# Patient Record
Sex: Female | Born: 1965 | Race: White | Hispanic: No | Smoking: Current every day smoker
Health system: Southern US, Community
[De-identification: ages and names within clinical notes are randomized; demographics above are authoritative.]

## PROBLEM LIST (undated history)

## (undated) DIAGNOSIS — M1990 Unspecified osteoarthritis, unspecified site: Secondary | ICD-10-CM

## (undated) DIAGNOSIS — C801 Malignant (primary) neoplasm, unspecified: Secondary | ICD-10-CM

## (undated) DIAGNOSIS — J45909 Unspecified asthma, uncomplicated: Secondary | ICD-10-CM

## (undated) DIAGNOSIS — I639 Cerebral infarction, unspecified: Secondary | ICD-10-CM

## (undated) DIAGNOSIS — K760 Fatty (change of) liver, not elsewhere classified: Secondary | ICD-10-CM

## (undated) DIAGNOSIS — E0500 Thyrotoxicosis with diffuse goiter without thyrotoxic crisis or storm: Secondary | ICD-10-CM

## (undated) DIAGNOSIS — R7303 Prediabetes: Secondary | ICD-10-CM

## (undated) DIAGNOSIS — Z8489 Family history of other specified conditions: Secondary | ICD-10-CM

## (undated) DIAGNOSIS — I219 Acute myocardial infarction, unspecified: Secondary | ICD-10-CM

## (undated) DIAGNOSIS — G43909 Migraine, unspecified, not intractable, without status migrainosus: Secondary | ICD-10-CM

## (undated) DIAGNOSIS — E119 Type 2 diabetes mellitus without complications: Secondary | ICD-10-CM

## (undated) DIAGNOSIS — G4730 Sleep apnea, unspecified: Secondary | ICD-10-CM

## (undated) DIAGNOSIS — L309 Dermatitis, unspecified: Secondary | ICD-10-CM

## (undated) DIAGNOSIS — E785 Hyperlipidemia, unspecified: Secondary | ICD-10-CM

## (undated) DIAGNOSIS — M7581 Other shoulder lesions, right shoulder: Secondary | ICD-10-CM

## (undated) DIAGNOSIS — K219 Gastro-esophageal reflux disease without esophagitis: Secondary | ICD-10-CM

## (undated) DIAGNOSIS — I4891 Unspecified atrial fibrillation: Secondary | ICD-10-CM

## (undated) DIAGNOSIS — C259 Malignant neoplasm of pancreas, unspecified: Secondary | ICD-10-CM

## (undated) DIAGNOSIS — E538 Deficiency of other specified B group vitamins: Secondary | ICD-10-CM

## (undated) DIAGNOSIS — F419 Anxiety disorder, unspecified: Secondary | ICD-10-CM

## (undated) DIAGNOSIS — F909 Attention-deficit hyperactivity disorder, unspecified type: Secondary | ICD-10-CM

## (undated) DIAGNOSIS — H18519 Endothelial corneal dystrophy, unspecified eye: Secondary | ICD-10-CM

## (undated) DIAGNOSIS — I10 Essential (primary) hypertension: Secondary | ICD-10-CM

## (undated) DIAGNOSIS — E1142 Type 2 diabetes mellitus with diabetic polyneuropathy: Secondary | ICD-10-CM

## (undated) HISTORY — PX: TUBAL LIGATION: SHX77

## (undated) HISTORY — PX: CHOLECYSTECTOMY: SHX55

## (undated) HISTORY — DX: Malignant (primary) neoplasm, unspecified: C80.1

## (undated) HISTORY — PX: OTHER SURGICAL HISTORY: SHX169

## (undated) HISTORY — PX: KNEE ARTHROSCOPY: SUR90

## (undated) HISTORY — PX: SINUS SURGERY WITH INSTATRAK: SHX5215

---
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
## 1988-05-20 HISTORY — PX: KNEE SURGERY: SHX244

## 2007-05-21 DIAGNOSIS — I639 Cerebral infarction, unspecified: Secondary | ICD-10-CM

## 2007-05-21 HISTORY — DX: Cerebral infarction, unspecified: I63.9

## 2007-11-11 DIAGNOSIS — E7800 Pure hypercholesterolemia, unspecified: Secondary | ICD-10-CM | POA: Insufficient documentation

## 2008-01-24 ENCOUNTER — Inpatient Hospital Stay: Payer: Self-pay | Admitting: Specialist

## 2008-02-04 ENCOUNTER — Other Ambulatory Visit: Payer: Self-pay

## 2008-02-04 ENCOUNTER — Emergency Department: Payer: Self-pay | Admitting: Emergency Medicine

## 2010-08-28 ENCOUNTER — Ambulatory Visit: Payer: Self-pay

## 2012-05-13 ENCOUNTER — Emergency Department: Payer: Self-pay | Admitting: Emergency Medicine

## 2012-05-13 LAB — COMPREHENSIVE METABOLIC PANEL
Alkaline Phosphatase: 119 U/L (ref 50–136)
BUN: 8 mg/dL (ref 7–18)
Bilirubin,Total: 0.3 mg/dL (ref 0.2–1.0)
Chloride: 102 mmol/L (ref 98–107)
Co2: 26 mmol/L (ref 21–32)
Creatinine: 0.85 mg/dL (ref 0.60–1.30)
EGFR (African American): >60
SGPT (ALT): 40 U/L (ref 12–78)
Total Protein: 7.4 g/dL (ref 6.4–8.2)

## 2012-05-13 LAB — CBC
HCT: 39.3 % (ref 35.0–47.0)
HGB: 13 g/dL (ref 12.0–16.0)
RBC: 4.35 10*6/uL (ref 3.80–5.20)
WBC: 14 10*3/uL — ABNORMAL HIGH (ref 3.6–11.0)

## 2012-05-13 LAB — LIPASE, BLOOD: Lipase: 199 U/L (ref 73–393)

## 2012-05-14 LAB — PRO B NATRIURETIC PEPTIDE: B-Type Natriuretic Peptide: 80 pg/mL (ref 0–125)

## 2012-05-14 LAB — TROPONIN I: Troponin-I: <0.02 ng/mL

## 2013-05-20 HISTORY — PX: CARDIAC CATHETERIZATION: SHX172

## 2013-11-03 ENCOUNTER — Ambulatory Visit: Payer: Self-pay | Admitting: Anesthesiology

## 2013-11-05 ENCOUNTER — Ambulatory Visit: Payer: Self-pay | Admitting: Unknown Physician Specialty

## 2013-12-30 IMAGING — CR DG CHEST 1V PORT
1 series · 1 of 1 positions shown · non-contrast
Comparison: none

REASON FOR EXAM: chest pain
COMMENTS:

PROCEDURE:     DXR - DXR PORTABLE CHEST SINGLE VIEW  - May 13, 2012 [DATE]
RESULT:     Comparison: None

[ap]
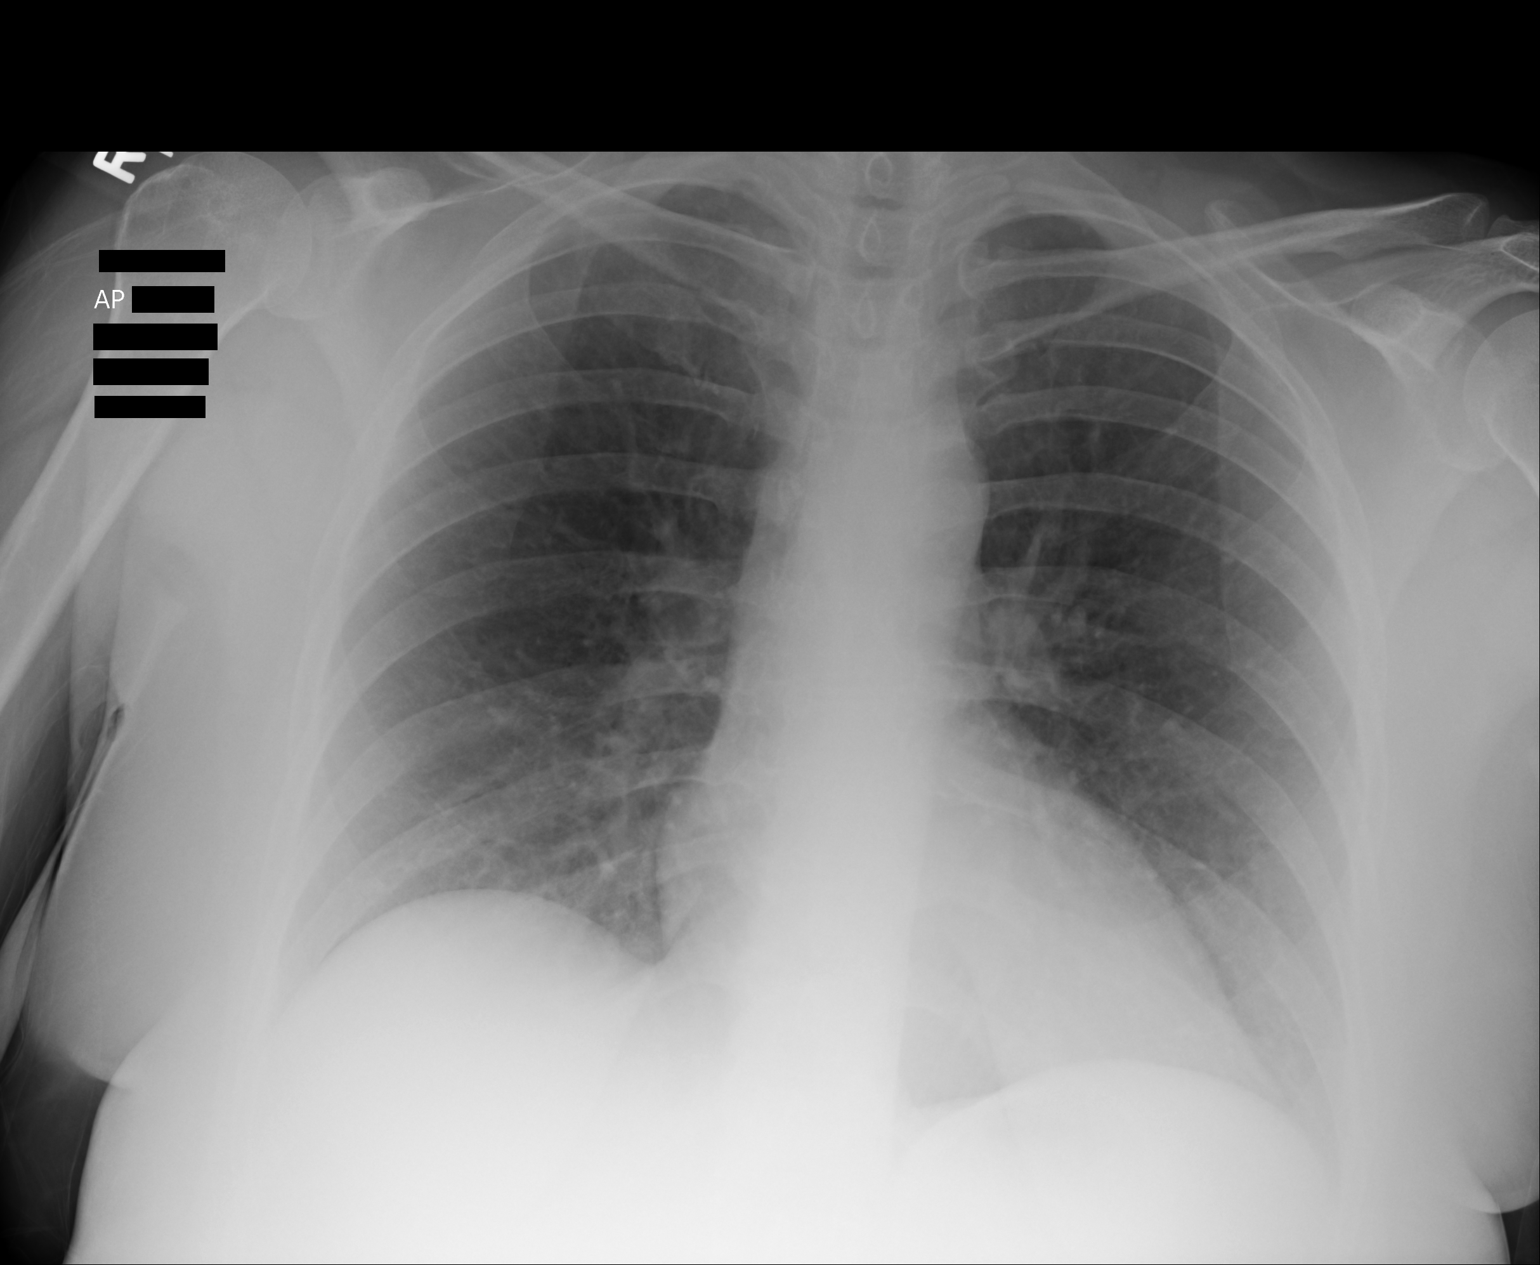

[1 of 1 positions shown; findings below may reference images not displayed]

FINDINGS: Single portable AP chest radiograph is provided.  There is no focal
parenchymal opacity, pleural effusion, or pneumothorax. Normal
cardiomediastinal silhouette. The osseous structures are unremarkable.
IMPRESSION: No acute disease of the che[REDACTED]

## 2014-01-18 DIAGNOSIS — I219 Acute myocardial infarction, unspecified: Secondary | ICD-10-CM

## 2014-01-18 HISTORY — DX: Acute myocardial infarction, unspecified: I21.9

## 2014-04-22 ENCOUNTER — Ambulatory Visit: Payer: Self-pay

## 2014-05-20 HISTORY — PX: CORONARY ANGIOPLASTY: SHX604

## 2014-11-03 DIAGNOSIS — I639 Cerebral infarction, unspecified: Secondary | ICD-10-CM | POA: Insufficient documentation

## 2014-11-03 DIAGNOSIS — L309 Dermatitis, unspecified: Secondary | ICD-10-CM | POA: Insufficient documentation

## 2014-11-03 DIAGNOSIS — G43909 Migraine, unspecified, not intractable, without status migrainosus: Secondary | ICD-10-CM | POA: Insufficient documentation

## 2014-11-03 DIAGNOSIS — E0590 Thyrotoxicosis, unspecified without thyrotoxic crisis or storm: Secondary | ICD-10-CM | POA: Insufficient documentation

## 2014-11-03 DIAGNOSIS — I10 Essential (primary) hypertension: Secondary | ICD-10-CM | POA: Insufficient documentation

## 2015-01-28 ENCOUNTER — Emergency Department
Admission: EM | Admit: 2015-01-28 | Discharge: 2015-01-28 | Disposition: A | Discharge status: Other Acute Inpatient Hospital | Payer: BLUE CROSS/BLUE SHIELD | Attending: Emergency Medicine | Admitting: Emergency Medicine

## 2015-01-28 ENCOUNTER — Emergency Department: Payer: BLUE CROSS/BLUE SHIELD

## 2015-01-28 DIAGNOSIS — I2510 Atherosclerotic heart disease of native coronary artery without angina pectoris: Secondary | ICD-10-CM

## 2015-01-28 DIAGNOSIS — I10 Essential (primary) hypertension: Secondary | ICD-10-CM | POA: Diagnosis not present

## 2015-01-28 DIAGNOSIS — I5189 Other ill-defined heart diseases: Secondary | ICD-10-CM

## 2015-01-28 DIAGNOSIS — Z720 Tobacco use: Secondary | ICD-10-CM | POA: Insufficient documentation

## 2015-01-28 DIAGNOSIS — I2119 ST elevation (STEMI) myocardial infarction involving other coronary artery of inferior wall: Secondary | ICD-10-CM | POA: Diagnosis not present

## 2015-01-28 DIAGNOSIS — R079 Chest pain, unspecified: Secondary | ICD-10-CM | POA: Diagnosis present

## 2015-01-28 HISTORY — DX: ST elevation (STEMI) myocardial infarction involving other coronary artery of inferior wall: I21.19

## 2015-01-28 HISTORY — DX: Atherosclerotic heart disease of native coronary artery without angina pectoris: I25.10

## 2015-01-28 HISTORY — DX: Thyrotoxicosis with diffuse goiter without thyrotoxic crisis or storm: E05.00

## 2015-01-28 HISTORY — DX: Hyperlipidemia, unspecified: E78.5

## 2015-01-28 HISTORY — DX: Essential (primary) hypertension: I10

## 2015-01-28 HISTORY — DX: Other ill-defined heart diseases: I51.89

## 2015-01-28 HISTORY — DX: Unspecified asthma, uncomplicated: J45.909

## 2015-01-28 HISTORY — PX: CORONARY ANGIOPLASTY WITH STENT PLACEMENT: SHX49

## 2015-01-28 LAB — COMPREHENSIVE METABOLIC PANEL
ALBUMIN: 4.3 g/dL (ref 3.5–5.0)
ALT: 33 U/L (ref 14–54)
AST: 28 U/L (ref 15–41)
Alkaline Phosphatase: 114 U/L (ref 38–126)
Anion gap: 7 (ref 5–15)
BUN: 6 mg/dL (ref 6–20)
CHLORIDE: 100 mmol/L — AB (ref 101–111)
CO2: 27 mmol/L (ref 22–32)
Calcium: 9.6 mg/dL (ref 8.9–10.3)
Creatinine, Ser: 0.71 mg/dL (ref 0.44–1.00)
GFR calc Af Amer: >60 mL/min (ref >60)
GFR calc non Af Amer: >60 mL/min (ref >60)
GLUCOSE: 207 mg/dL — AB (ref 65–99)
POTASSIUM: 3.9 mmol/L (ref 3.5–5.1)
Sodium: 134 mmol/L — ABNORMAL LOW (ref 135–145)
Total Bilirubin: 0.7 mg/dL (ref 0.3–1.2)
Total Protein: 7.3 g/dL (ref 6.5–8.1)

## 2015-01-28 LAB — CBC WITH DIFFERENTIAL/PLATELET
Basophils Absolute: 0.1 10*3/uL (ref 0–0.1)
Basophils Relative: 0 %
Eosinophils Absolute: 0.4 10*3/uL (ref 0–0.7)
Eosinophils Relative: 2 %
HEMATOCRIT: 42 % (ref 35.0–47.0)
Hemoglobin: 13.9 g/dL (ref 12.0–16.0)
LYMPHS PCT: 24 %
Lymphs Abs: 3.4 10*3/uL (ref 1.0–3.6)
MCH: 30.2 pg (ref 26.0–34.0)
MCHC: 33.1 g/dL (ref 32.0–36.0)
MCV: 91.1 fL (ref 80.0–100.0)
MONO ABS: 0.9 10*3/uL (ref 0.2–0.9)
MONOS PCT: 6 %
NEUTROS ABS: 9.8 10*3/uL — AB (ref 1.4–6.5)
Neutrophils Relative %: 68 %
Platelets: 273 10*3/uL (ref 150–440)
RBC: 4.61 MIL/uL (ref 3.80–5.20)
RDW: 13.7 % (ref 11.5–14.5)
WBC: 14.6 10*3/uL — ABNORMAL HIGH (ref 3.6–11.0)

## 2015-01-28 LAB — TROPONIN I: Troponin I: 0.06 ng/mL — ABNORMAL HIGH (ref <0.031)

## 2015-01-28 MED ORDER — PROMETHAZINE HCL 25 MG/ML IJ SOLN
25.0000 mg | Freq: Once | INTRAMUSCULAR | Status: AC
  Administered 2015-01-28: 25 mg via INTRAVENOUS

## 2015-01-28 MED ORDER — CLOPIDOGREL BISULFATE 75 MG PO TABS
600.0000 mg | ORAL_TABLET | Freq: Every day | ORAL | Status: DC
  Administered 2015-01-28: 600 mg via ORAL

## 2015-01-28 MED ORDER — HEPARIN SODIUM (PORCINE) 1000 UNIT/ML IJ SOLN
5000.0000 [IU] | Freq: Once | INTRAMUSCULAR | Status: AC
  Administered 2015-01-28: 5000 [IU] via INTRAVENOUS

## 2015-01-28 MED ORDER — PROMETHAZINE HCL 25 MG/ML IJ SOLN
INTRAMUSCULAR | Status: AC
  Administered 2015-01-28: 25 mg via INTRAVENOUS
  Filled 2015-01-28: qty 1

## 2015-01-28 MED ORDER — MORPHINE SULFATE (PF) 4 MG/ML IV SOLN
4.0000 mg | Freq: Once | INTRAVENOUS | Status: AC
  Administered 2015-01-28: 4 mg via INTRAVENOUS

## 2015-01-28 MED ORDER — MORPHINE SULFATE (PF) 4 MG/ML IV SOLN
INTRAVENOUS | Status: AC
  Administered 2015-01-28: 4 mg via INTRAVENOUS
  Filled 2015-01-28: qty 1

## 2015-01-28 NOTE — ED Provider Notes (Signed)
[facility] Emergency Department Provider Note  Time seen: 1:58 PM  I have reviewed the triage vital signs and the nursing notes.   HISTORY  Chief Complaint Chest Pain    HPI Caitlyn Roth is a 49 y.o. female with a past medical history of hypertension, hyperlipidemia, hypothyroidism presents the emergency department with chest pain. According to the patient she first developed chest pain 2 days ago while exercising. States the chest pain was so significant she had to stop exercising and rest. Patient states the chest pain went away after resting. This morning while at rest the patient developed significant substernal chest pain. States it is a 12/10 on the pain scale, took 4 baby aspirin and 1 oxycodone and came to the emergency department. On arrival the patient states her pain as a 7/10. Positive nausea, denies vomiting or diaphoresis does state mild shortness of breath. No history of cardiac disease per patient.     Past Medical History  Diagnosis Date  . Hypertension   . Asthma     There are no active problems to display for this patient.   Past Surgical History  Procedure Laterality Date  . Knee arthroscopy    . Cholecystectomy    . Tubal ligation    . Cesarean section    . Sinus surgery with instatrak      No current outpatient prescriptions on file.  Allergies Review of patient's allergies indicates no known allergies.  No family history on file.  Social History Social History  Substance Use Topics  . Smoking status: Current Every Day Smoker    Types: Cigarettes  . Smokeless tobacco: Never Used  . Alcohol Use: Yes    Review of Systems Constitutional: Negative for fever. Cardiovascular: Positive for chest pain Respiratory: Positive for shortness of breath Gastrointestinal: Negative for abdominal pain. Positive for nausea, but negative for vomiting or diarrhea Genitourinary: Negative for dysuria. Neurological: Negative for  headache 10-point ROS otherwise negative.  ____________________________________________   PHYSICAL EXAM:  VITAL SIGNS: ED Triage Vitals  Enc Vitals Group     BP 01/28/15 1336 151/85 mmHg     Pulse Rate 01/28/15 1336 57     Resp 01/28/15 1336 18     Temp 01/28/15 1336 97.7 F (36.5 C)     Temp Source 01/28/15 1336 Oral     SpO2 01/28/15 1336 99 %     Weight 01/28/15 1336 210 lb (95.255 kg)     Height 01/28/15 1336 5\' 9"  (1.753 m)     Head Cir --      Peak Flow --      Pain Score 01/28/15 1337 7     Pain Loc --      Pain Edu? --      Excl. in GC? --     Constitutional: Alert and oriented. Well appearing and in no distress. Eyes: Normal exam ENT   Mouth/Throat: Mucous membranes are moist. Cardiovascular: Normal rate, regular rhythm. No murmur Respiratory: Normal respiratory effort without tachypnea nor retractions. Breath sounds are clear and equal bilaterally. No wheezes/rales/rhonchi. Nontender to palpation Gastrointestinal: Soft and nontender. No distention. Musculoskeletal: Nontender with normal range of motion in all extremities. No lower extremity tenderness or edema. Neurologic:  Normal speech and language. No gross focal neurologic deficits Skin:  Skin is warm, dry and intact.  Psychiatric: Mood and affect are normal. Speech and behavior are normal.   ____________________________________________    EKG  EKG reviewed and interpreted by myself shows  normal sinus rhythm at 61 bpm, narrow QRS, normal axis, normal intervals, ST elevation noted in leads 2, 3, aVF with reciprocal depressions in leads 1, aVL, most consistent with an inferior STEMI.  ____________________________________________    RADIOLOGY  Chest x-ray shows no active disease.  ____________________________________________   INITIAL IMPRESSION / ASSESSMENT AND PLAN / ED COURSE  Pertinent labs & imaging results that were available during my care of the patient were reviewed by me and  considered in my medical decision making (see chart for details).  Patient presents with chest pain. EKG consistent with inferior STEMI. Patient took 4 baby aspirin just prior to arrival. We have dosed 600 mg of by mouth Plavix, 5000 units of heparin IV bolus. I discussed the patient with the cardiology fellow at [facility] they have activated the catheterization lab except for the patient directly to the catheter lab. We are currently working with EMS to arrange emergent transport.  Labs have resulted showing a troponin of 0.06, WBC of 14, otherwise within normal limits. Patient has been transported by local EMS to [facility].   CRITICAL CARE Performed by: Minna Antis   Total critical care time: 30 minutes  Critical care time was exclusive of separately billable procedures and treating other patients.  Critical care was necessary to treat or prevent imminent or life-threatening deterioration.  Critical care was time spent personally by me on the following activities: development of treatment plan with patient and/or surrogate as well as nursing, discussions with consultants, evaluation of patient's response to treatment, examination of patient, obtaining history from patient or surrogate, ordering and performing treatments and interventions, ordering and review of laboratory studies, ordering and review of radiographic studies, pulse oximetry and re-evaluation of patient's condition.   ____________________________________________   FINAL CLINICAL IMPRESSION(S) / ED DIAGNOSES  Inferior STEMI   Minna Antis, MD 01/28/15 [phone]

## 2015-01-28 NOTE — ED Notes (Signed)
Pt c/o intermittent substernal chest pain that radiates into the back and jaw , states it was intermittent since Thursday until today the pain worsened with nausea.Marland Kitchen

## 2015-02-07 DIAGNOSIS — I2510 Atherosclerotic heart disease of native coronary artery without angina pectoris: Secondary | ICD-10-CM | POA: Insufficient documentation

## 2015-02-28 ENCOUNTER — Encounter: Payer: BLUE CROSS/BLUE SHIELD | Attending: Cardiology | Admitting: *Deleted

## 2015-02-28 VITALS — Ht 70.0 in | Wt 209.7 lb

## 2015-02-28 DIAGNOSIS — I252 Old myocardial infarction: Secondary | ICD-10-CM | POA: Diagnosis not present

## 2015-03-01 DIAGNOSIS — I252 Old myocardial infarction: Secondary | ICD-10-CM

## 2015-03-01 LAB — GLUCOSE, CAPILLARY
GLUCOSE-CAPILLARY: 104 mg/dL — AB (ref 65–99)
Glucose-Capillary: 94 mg/dL (ref 65–99)

## 2015-03-01 NOTE — Progress Notes (Signed)
Cardiac Individual Treatment Plan  Patient Details  Name: Caitlyn Roth MRN: [id number] Date of Birth: [birth year] Referring Provider:  Dalia Heading, MD  Initial Encounter Date: Date: 02/28/15  Visit Diagnosis: Status post myocardial infarction  Patient's Home Medications on Admission:  Current outpatient prescriptions:  .  amphetamine-dextroamphetamine (ADDERALL XR) 30 MG 24 hr capsule, Take 30 mg by mouth., Disp: , Rfl:  .  atenolol (TENORMIN) 50 MG tablet, Take 100 mg by mouth., Disp: , Rfl:  .  cyclobenzaprine (FLEXERIL) 10 MG tablet, , Disp: , Rfl:  .  esomeprazole (NEXIUM) 40 MG capsule, Take 40 mg by mouth., Disp: , Rfl:  .  meloxicam (MOBIC) 7.5 MG tablet, , Disp: , Rfl:  .  methimazole (TAPAZOLE) 5 MG tablet, Take one-half tablet by mouth daily, Disp: , Rfl:  .  methocarbamol (ROBAXIN) 750 MG tablet, , Disp: , Rfl:  .  montelukast (SINGULAIR) 10 MG tablet, Take 10 mg by mouth., Disp: , Rfl:  .  venlafaxine XR (EFFEXOR-XR) 150 MG 24 hr capsule, Take 150 mg by mouth., Disp: , Rfl:  .  atorvastatin (LIPITOR) 20 MG tablet, Take 20 mg by mouth., Disp: , Rfl:  .  Calcium Carb-Ergocalciferol 500-200 MG-UNIT TABS, Take by mouth., Disp: , Rfl:  .  desloratadine (CLARINEX) 5 MG tablet, Take 5 mg by mouth., Disp: , Rfl:  .  ferrous sulfate 325 (65 FE) MG tablet, Take 325 mg by mouth., Disp: , Rfl:  .  Liraglutide 18 MG/3ML SOPN, Inject 1.8 mg into the skin., Disp: , Rfl:   Past Medical History: Past Medical History  Diagnosis Date  . Hypertension   . Asthma   . Hyperlipemia   . Graves disease     Tobacco Use: History  Smoking status  . Former Smoker -- 1.00 packs/day for 35 years  . Types: Cigarettes  . Quit date: 01/27/2015  Smokeless tobacco  . Never Used    Labs: Recent Review Flowsheet Data    There is no flowsheet data to display.       Exercise Target Goals: Date: 02/28/15  Exercise Program Goal: Individual exercise prescription set with THRR, safety &  activity barriers. Participant demonstrates ability to understand and report RPE using BORG scale, to self-measure pulse accurately, and to acknowledge the importance of the exercise prescription.  Exercise Prescription Goal: Starting with aerobic activity 30 plus minutes a day, 3 days per week for initial exercise prescription. Provide home exercise prescription and guidelines that participant acknowledges understanding prior to discharge.  Activity Barriers & Risk Stratification:     Activity Barriers & Risk Stratification - 03/01/15 1024    Activity Barriers & Risk Stratification   Risk Stratification High      6 Minute Walk:     6 Minute Walk      02/28/15 0957       6 Minute Walk   Phase Initial     Distance 1570 feet     Walk Time 6 minutes     Resting HR 75 bpm     Resting BP 124/80 mmHg     Max Ex. HR 110 bpm     Max Ex. BP 126/74 mmHg     RPE 11     Symptoms No        Initial Exercise Prescription:     Initial Exercise Prescription - 02/28/15 1000    Date of Initial Exercise Prescription   Date 02/28/15   Treadmill   MPH 2.8  Grade 0   Minutes 10   Bike   Level 0.4   Minutes 10   Recumbant Bike   Level 3   RPM 40   Watts 25   Minutes 15   NuStep   Level 2   Watts 20   Minutes 15   Arm Ergometer   Level 1   Watts 10   Minutes 10   Arm/Foot Ergometer   Level 4   Watts 12   Minutes 10   Cybex   Level 2   RPM 50   Minutes 15   Recumbant Elliptical   Level 1   RPM 40   Watts 10   Minutes 15   REL-XR   Level 2   Watts 25   Minutes 15   Prescription Details   Frequency (times per week) 3   Duration Progress to 30 minutes of continuous aerobic without signs/symptoms of physical distress   Intensity   THRR REST +  30   Ratings of Perceived Exertion 11-15   Progression Continue progressive overload as per policy without signs/symptoms or physical distress.   Resistance Training   Training Prescription Yes   Weight 2   Reps 10-15       Exercise Prescription Changes:   Discharge Exercise Prescription (Final Exercise Prescription Changes):   Nutrition:  Target Goals: Understanding of nutrition guidelines, daily intake of sodium 1500mg , cholesterol 200mg , calories 30% from fat and 7% or less from saturated fats, daily to have 5 or more servings of fruits and vegetables.  Biometrics:     Pre Biometrics - 02/28/15 0956    Pre Biometrics   Height 5\' 10"  (1.778 m)   Weight 209 lb 11.2 oz (95.119 kg)   Waist Circumference 39 inches   Hip Circumference 43.5 inches   Waist to Hip Ratio 0.9 %   BMI (Calculated) 30.2       Nutrition Therapy Plan and Nutrition Goals:     Nutrition Therapy & Goals - 02/28/15 1626    Nutrition Therapy   Drug/Food Interactions Statins/Certain Fruits   Intervention Plan   Intervention Using nutrition plan and personal goals to gain a healthy nutrition lifestyle. Add exercise as prescribed.      Nutrition Discharge: Rate Your Plate Scores:   Nutrition Goals Re-Evaluation:   Psychosocial: Target Goals: Acknowledge presence or absence of depression, maximize coping skills, provide positive support system. Participant is able to verbalize types and ability to use techniques and skills needed for reducing stress and depression.  Initial Review & Psychosocial Screening:     Initial Psych Review & Screening - 03/01/15 1028    Initial Review   Current issues with Current Stress Concerns  Caitlyn Roth and her family have had multiple heatlh problems. Son [name] heart surgery as a infant, Husband is waiting for a LUng transplant. He was in [facility] 6   Family Dynamics   Good Support System? Yes   Barriers   Psychosocial barriers to participate in program The patient should benefit from training in stress management and relaxation.   Screening Interventions   Interventions Encouraged to exercise;Program counselor consult      Quality of Life Scores:   PHQ-9:      Recent Review Flowsheet Data    There is no flowsheet data to display.      Psychosocial Evaluation and Intervention:   Psychosocial Re-Evaluation:   Vocational Rehabilitation: Provide vocational rehab assistance to qualifying candidates.   Vocational Rehab Evaluation & Intervention:  Vocational Rehab - 03/01/15 1024    Initial Vocational Rehab Evaluation & Intervention   Assessment shows need for Vocational Rehabilitation No      Education: Education Goals: Education classes will be provided on a weekly basis, covering required topics. Participant will state understanding/return demonstration of topics presented.  Learning Barriers/Preferences:     Learning Barriers/Preferences - 03/01/15 1024    Learning Barriers/Preferences   Learning Barriers None   Learning Preferences None      Education Topics: General Nutrition Guidelines/Fats and Fiber: -Group instruction provided by verbal, written material, models and posters to present the general guidelines for heart healthy nutrition. Gives an explanation and review of dietary fats and fiber.   Controlling Sodium/Reading Food Labels: -Group verbal and written material supporting the discussion of sodium use in heart healthy nutrition. Review and explanation with models, verbal and written materials for utilization of the food label.   Exercise Physiology & Risk Factors: - Group verbal and written instruction with models to review the exercise physiology of the cardiovascular system and associated critical values. Details cardiovascular disease risk factors and the goals associated with each risk factor.   Aerobic Exercise & Resistance Training: - Gives group verbal and written discussion on the health impact of inactivity. On the components of aerobic and resistive training programs and the benefits of this training and how to safely progress through these programs.   Flexibility, Balance, General Exercise  Guidelines: - Provides group verbal and written instruction on the benefits of flexibility and balance training programs. Provides general exercise guidelines with specific guidelines to those with heart or lung disease. Demonstration and skill practice provided.   Stress Management: - Provides group verbal and written instruction about the health risks of elevated stress, cause of high stress, and healthy ways to reduce stress.   Depression: - Provides group verbal and written instruction on the correlation between heart/lung disease and depressed mood, treatment options, and the stigmas associated with seeking treatment.   Anatomy & Physiology of the Heart: - Group verbal and written instruction and models provide basic cardiac anatomy and physiology, with the coronary electrical and arterial systems. Review of: AMI, Angina, Valve disease, Heart Failure, Cardiac Arrhythmia, Pacemakers, and the ICD.   Cardiac Procedures: - Group verbal and written instruction and models to describe the testing methods done to diagnose heart disease. Reviews the outcomes of the test results. Describes the treatment choices: Medical Management, Angioplasty, or Coronary Bypass Surgery.   Cardiac Medications: - Group verbal and written instruction to review commonly prescribed medications for heart disease. Reviews the medication, class of the drug, and side effects. Includes the steps to properly store meds and maintain the prescription regimen.   Go Sex-Intimacy & Heart Disease, Get SMART - Goal Setting: - Group verbal and written instruction through game format to discuss heart disease and the return to sexual intimacy. Provides group verbal and written material to discuss and apply goal setting through the application of the S.M.A.R.T. Method.   Other Matters of the Heart: - Provides group verbal, written materials and models to describe Heart Failure, Angina, Valve Disease, and Diabetes in the realm of  heart disease. Includes description of the disease process and treatment options available to the cardiac patient.   Exercise & Equipment Safety: - Individual verbal instruction and demonstration of equipment use and safety with use of the equipment.          Cardiac Rehab from 02/28/2015 in [facility] Cardiac Rehab   Date  03/01/15  Educator  C. Ginette Bradway,RN   Instruction Review Code  1- partially meets, needs review/practice      Infection Prevention: - Provides verbal and written material to individual with discussion of infection control including proper hand washing and proper equipment cleaning during exercise session.      Cardiac Rehab from 02/28/2015 in [facility] Cardiac Rehab   Date  03/01/15   Educator  C. Ashlen Kiger,RN   Instruction Review Code  2- meets goals/outcomes      Falls Prevention: - Provides verbal and written material to individual with discussion of falls prevention and safety.      Cardiac Rehab from 02/28/2015 in [facility] Cardiac Rehab   Date  03/01/15   Educator  C. ENterkinRN   Instruction Review Code  2- meets goals/outcomes      Diabetes: - Individual verbal and written instruction to review signs/symptoms of diabetes, desired ranges of glucose level fasting, after meals and with exercise. Advice that pre and post exercise glucose checks will be done for 3 sessions at entry of program.      Cardiac Rehab from 02/28/2015 in [facility] Cardiac Rehab   Date  03/01/15   Educator  C. Damarco Keysor, RN   Instruction Review Code  1- partially meets, needs review/practice       Knowledge Questionnaire Score:     Knowledge Questionnaire Score - 03/01/15 1024    Knowledge Questionnaire Score   Pre Score 25      Personal Goals and Risk Factors at Admission:     Personal Goals and Risk Factors at Admission - 02/28/15 1527    Personal Goals and Risk Factors on Admission   Increase Aerobic Exercise and Physical Activity Yes   Intervention While in program, learn and  follow the exercise prescription taught. Start at a low level workload and increase workload after able to maintain previous level for 30 minutes. Increase time before increasing intensity.   Quit Smoking Yes  Quit 01/27/2015   Intervention Utilize your health care professional team to help with smoking cessation while in the program. Your doctor can prescribe medications to aid in cessation. The program can provide information and counseling as needed.   Diabetes Yes   Goal Blood glucose control identified by blood glucose values, HgbA1C. Participant verbalizes understanding of the signs/symptoms of hyper/hypo glycemia, proper foot care and importance of medication and nutrition plan for blood glucose control.   Intervention Provide nutrition & aerobic exercise along with prescribed medications to achieve blood glucose in normal ranges: Fasting 65-99 mg/dL   Hypertension Yes   Goal Participant will see blood pressure controlled within the values of 140/96mm/Hg or within value directed by their physician.   Intervention Provide nutrition & aerobic exercise along with prescribed medications to achieve BP 140/90 or less.   Lipids Yes   Goal Cholesterol controlled with medications as prescribed, with individualized exercise RX and with personalized nutrition plan. Value goals: LDL < 70mg , HDL > 40mg . Participant states understanding of desired cholesterol values and following prescriptions.   Intervention Provide nutrition & aerobic exercise along with prescribed medications to achieve LDL 70mg , HDL >40mg .   Stress Yes   Goal To meet with psychosocial counselor for stress and relaxation information and guidance. To state understanding of performing relaxation techniques and or identifying personal stressors.   Intervention Provide education on types of stress, identifiying stressors, and ways to cope with stress. Provide demonstration and active practice of relaxation techniques.  Caitlyn Roth recently quit  smoking.  Personal Goals and Risk Factors Review:    Personal Goals Discharge (Final Personal Goals and Risk Factors Review):     Comments: Caitlyn Roth and her family have had multiple heatlh problems. Son [name] heart surgery as a infant, Husband is waiting for a LUng transplant. He was in [facility] 6 months ago. Caitlyn Roth has had multiple, multiple health problems including a CVA where she could not think -no residual problems with that now. Quite smoking 01/27/2015 and using Nicotrol.

## 2015-03-01 NOTE — Patient Instructions (Addendum)
Patient Instructions  Patient Details  Name: Caitlyn Roth MRN: [id number] Date of Birth: 03/09/1966 Referring Provider:  Dalia Heading, MD  Below are the personal goals you chose as well as exercise and nutrition goals. Our goal is to help you keep on track towards obtaining and maintaining your goals. We will be discussing your progress on these goals with you throughout the program.  Initial Exercise Prescription:     Initial Exercise Prescription - 02/28/15 1000    Date of Initial Exercise Prescription   Date 02/28/15   Treadmill   MPH 2.8   Grade 0   Minutes 10   Bike   Level 0.4   Minutes 10   Recumbant Bike   Level 3   RPM 40   Watts 25   Minutes 15   NuStep   Level 2   Watts 20   Minutes 15   Arm Ergometer   Level 1   Watts 10   Minutes 10   Arm/Foot Ergometer   Level 4   Watts 12   Minutes 10   Cybex   Level 2   RPM 50   Minutes 15   Recumbant Elliptical   Level 1   RPM 40   Watts 10   Minutes 15   REL-XR   Level 2   Watts 25   Minutes 15   Prescription Details   Frequency (times per week) 3   Duration Progress to 30 minutes of continuous aerobic without signs/symptoms of physical distress   Intensity   THRR REST +  30   Ratings of Perceived Exertion 11-15   Progression Continue progressive overload as per policy without signs/symptoms or physical distress.   Resistance Training   Training Prescription Yes   Weight 2   Reps 10-15      Exercise Goals: Frequency: Be able to perform aerobic exercise three times per week working toward 3-5 days per week.  Intensity: Work with a perceived exertion of 11 (fairly light) - 15 (hard) as tolerated. Follow your new exercise prescription and watch for changes in prescription as you progress with the program. Changes will be reviewed with you when they are made.  Duration: You should be able to do 30 minutes of continuous aerobic exercise in addition to a 5 minute warm-up and a 5 minute cool-down  routine.  Nutrition Goals: Your personal nutrition goals will be established when you do your nutrition analysis with the dietician.  The following are nutrition guidelines to follow: Cholesterol < 200mg /day Sodium < 1500mg /day Fiber: Women under 50 yrs - 25 grams per day  Personal Goals:     Personal Goals and Risk Factors at Admission - 02/28/15 1527    Personal Goals and Risk Factors on Admission   Increase Aerobic Exercise and Physical Activity Yes   Intervention While in program, learn and follow the exercise prescription taught. Start at a low level workload and increase workload after able to maintain previous level for 30 minutes. Increase time before increasing intensity.   Quit Smoking Yes  Quit 01/27/2015   Intervention Utilize your health care professional team to help with smoking cessation while in the program. Your doctor can prescribe medications to aid in cessation. The program can provide information and counseling as needed.   Diabetes Yes   Goal Blood glucose control identified by blood glucose values, HgbA1C. Participant verbalizes understanding of the signs/symptoms of hyper/hypo glycemia, proper foot care and importance of medication and nutrition plan for  blood glucose control.   Intervention Provide nutrition & aerobic exercise along with prescribed medications to achieve blood glucose in normal ranges: Fasting 65-99 mg/dL   Hypertension Yes   Goal Participant will see blood pressure controlled within the values of 140/6mm/Hg or within value directed by their physician.   Intervention Provide nutrition & aerobic exercise along with prescribed medications to achieve BP 140/90 or less.   Lipids Yes   Goal Cholesterol controlled with medications as prescribed, with individualized exercise RX and with personalized nutrition plan. Value goals: LDL < , HDL > . Participant states understanding of desired cholesterol values and following prescriptions.    Intervention Provide nutrition & aerobic exercise along with prescribed medications to achieve LDL 70mg , HDL >40mg .   Stress Yes   Goal To meet with psychosocial counselor for stress and relaxation information and guidance. To state understanding of performing relaxation techniques and or identifying personal stressors.   Intervention Provide education on types of stress, identifiying stressors, and ways to cope with stress. Provide demonstration and active practice of relaxation techniques.  Cayle recently quit smoking.      Tobacco Use Initial Evaluation: History  Smoking status  . Former Smoker -- 1.00 packs/day for 35 years  . Types: Cigarettes  . Quit date: 01/27/2015  Smokeless tobacco  . Never Used    Copy of goals given to participant. Patient Instructions  Patient Details  Name: Caitlyn Roth MRN: [id number] Date of Birth: 1965-06-09 Referring Provider:  Dalia Heading, MD  Below are the personal goals you chose as well as exercise and nutrition goals. Our goal is to help you keep on track towards obtaining and maintaining your goals. We will be discussing your progress on these goals with you throughout the program.  Initial Exercise Prescription:     Initial Exercise Prescription - 02/28/15 1000    Date of Initial Exercise Prescription   Date 02/28/15   Treadmill   MPH 2.8   Grade 0   Minutes 10   Bike   Level 0.4   Minutes 10   Recumbant Bike   Level 3   RPM 40   Watts 25   Minutes 15   NuStep   Level 2   Watts 20   Minutes 15   Arm Ergometer   Level 1   Watts 10   Minutes 10   Arm/Foot Ergometer   Level 4   Watts 12   Minutes 10   Cybex   Level 2   RPM 50   Minutes 15   Recumbant Elliptical   Level 1   RPM 40   Watts 10   Minutes 15   REL-XR   Level 2   Watts 25   Minutes 15   Prescription Details   Frequency (times per week) 3   Duration Progress to 30 minutes of continuous aerobic without signs/symptoms of physical distress    Intensity   THRR REST +  30   Ratings of Perceived Exertion 11-15   Progression Continue progressive overload as per policy without signs/symptoms or physical distress.   Resistance Training   Training Prescription Yes   Weight 2   Reps 10-15      Exercise Goals: Frequency: Be able to perform aerobic exercise three times per week working toward 3-5 days per week.  Intensity: Work with a perceived exertion of 11 (fairly light) - 15 (hard) as tolerated. Follow your new exercise prescription and watch for changes in prescription as you  Patient Instructions  Patient Details  Name: Caitlyn Roth MRN: [id number] Date of Birth: 03/09/1966 Referring Provider:  Dalia Heading, MD  Below are the personal goals you chose as well as exercise and nutrition goals. Our goal is to help you keep on track towards obtaining and maintaining your goals. We will be discussing your progress on these goals with you throughout the program.  Initial Exercise Prescription:     Initial Exercise Prescription - 02/28/15 1000    Date of Initial Exercise Prescription   Date 02/28/15   Treadmill   MPH 2.8   Grade 0   Minutes 10   Bike   Level 0.4   Minutes 10   Recumbant Bike   Level 3   RPM 40   Watts 25   Minutes 15   NuStep   Level 2   Watts 20   Minutes 15   Arm Ergometer   Level 1   Watts 10   Minutes 10   Arm/Foot Ergometer   Level 4   Watts 12   Minutes 10   Cybex   Level 2   RPM 50   Minutes 15   Recumbant Elliptical   Level 1   RPM 40   Watts 10   Minutes 15   REL-XR   Level 2   Watts 25   Minutes 15   Prescription Details   Frequency (times per week) 3   Duration Progress to 30 minutes of continuous aerobic without signs/symptoms of physical distress   Intensity   THRR REST +  30   Ratings of Perceived Exertion 11-15   Progression Continue progressive overload as per policy without signs/symptoms or physical distress.   Resistance Training   Training Prescription Yes   Weight 2   Reps 10-15      Exercise Goals: Frequency: Be able to perform aerobic exercise three times per week working toward 3-5 days per week.  Intensity: Work with a perceived exertion of 11 (fairly light) - 15 (hard) as tolerated. Follow your new exercise prescription and watch for changes in prescription as you progress with the program. Changes will be reviewed with you when they are made.  Duration: You should be able to do 30 minutes of continuous aerobic exercise in addition to a 5 minute warm-up and a 5 minute cool-down  routine.  Nutrition Goals: Your personal nutrition goals will be established when you do your nutrition analysis with the dietician.  The following are nutrition guidelines to follow: Cholesterol < 200mg /day Sodium < 1500mg /day Fiber: Women under 50 yrs - 25 grams per day  Personal Goals:     Personal Goals and Risk Factors at Admission - 02/28/15 1527    Personal Goals and Risk Factors on Admission   Increase Aerobic Exercise and Physical Activity Yes   Intervention While in program, learn and follow the exercise prescription taught. Start at a low level workload and increase workload after able to maintain previous level for 30 minutes. Increase time before increasing intensity.   Quit Smoking Yes  Quit 01/27/2015   Intervention Utilize your health care professional team to help with smoking cessation while in the program. Your doctor can prescribe medications to aid in cessation. The program can provide information and counseling as needed.   Diabetes Yes   Goal Blood glucose control identified by blood glucose values, HgbA1C. Participant verbalizes understanding of the signs/symptoms of hyper/hypo glycemia, proper foot care and importance of medication and nutrition plan for

## 2015-03-01 NOTE — Progress Notes (Signed)
Daily Session Note  Patient Details  Name: Caitlyn Roth MRN: [id number] Date of Birth: July 14, 1965 Referring Provider:  Teodoro Spray, MD  Encounter Date: 03/01/2015  Check In:     Session Check In - 03/01/15 1825    Check-In   Staff Present Lestine Box BS, ACSM EP-C, Exercise Physiologist;Renee Dillard [place] MS, ACSM CEP Exercise Physiologist;Carroll Enterkin RN, BSN   ER physicians immediately available to respond to emergencies See telemetry face sheet for immediately available ER MD   Medication changes reported     No   Fall or balance concerns reported    No   Warm-up and Cool-down Performed on first and last piece of equipment   VAD Patient? No   Pain Assessment   Currently in Pain? No/denies         Goals Met:  Proper associated with RPD/PD & O2 Sat Exercise tolerated well No report of cardiac concerns or symptoms Strength training completed today  Goals Unmet:  Not Applicable  Goals Comments:    Dr. [name] is Medical Director for [place] and [facility].

## 2015-03-01 NOTE — Progress Notes (Signed)
Cardiac Individual Treatment Plan  Patient Details  Name: Caitlyn Roth MRN: [id number] Date of Birth: 1966/04/17 Referring Provider:  Dalia Heading, MD  Initial Encounter Date: Date: 02/28/15  Visit Diagnosis: Status post myocardial infarction - Plan: CARDIAC Roth 30 DAY REVIEW  Patient's Home Medications on Admission:  Current outpatient prescriptions:  .  amphetamine-dextroamphetamine (ADDERALL XR) 30 MG 24 hr capsule, Take 30 mg by mouth., Disp: , Rfl:  .  atenolol (TENORMIN) 50 MG tablet, Take 100 mg by mouth., Disp: , Rfl:  .  cyclobenzaprine (FLEXERIL) 10 MG tablet, , Disp: , Rfl:  .  esomeprazole (NEXIUM) 40 MG capsule, Take 40 mg by mouth., Disp: , Rfl:  .  meloxicam (MOBIC) 7.5 MG tablet, , Disp: , Rfl:  .  methimazole (TAPAZOLE) 5 MG tablet, Take one-half tablet by mouth daily, Disp: , Rfl:  .  methocarbamol (ROBAXIN) 750 MG tablet, , Disp: , Rfl:  .  montelukast (SINGULAIR) 10 MG tablet, Take 10 mg by mouth., Disp: , Rfl:  .  venlafaxine XR (EFFEXOR-XR) 150 MG 24 hr capsule, Take 150 mg by mouth., Disp: , Rfl:  .  atorvastatin (LIPITOR) 20 MG tablet, Take 20 mg by mouth., Disp: , Rfl:  .  Calcium Carb-Ergocalciferol 500-200 MG-UNIT TABS, Take by mouth., Disp: , Rfl:  .  desloratadine (CLARINEX) 5 MG tablet, Take 5 mg by mouth., Disp: , Rfl:  .  ferrous sulfate 325 (65 FE) MG tablet, Take 325 mg by mouth., Disp: , Rfl:  .  Liraglutide 18 MG/3ML SOPN, Inject 1.8 mg into the skin., Disp: , Rfl:   Past Medical History: Past Medical History  Diagnosis Date  . Hypertension   . Asthma   . Hyperlipemia   . Graves disease     Tobacco Use: History  Smoking status  . Former Smoker -- 1.00 packs/day for 35 years  . Types: Cigarettes  . Quit date: 01/27/2015  Smokeless tobacco  . Never Used    Labs: Recent Review Flowsheet Data    There is no flowsheet data to display.       Exercise Target Goals: Date: 02/28/15  Exercise Program Goal: Individual exercise  prescription set with THRR, safety & activity barriers. Participant demonstrates ability to understand and report RPE using BORG scale, to self-measure pulse accurately, and to acknowledge the importance of the exercise prescription.  Exercise Prescription Goal: Starting with aerobic activity 30 plus minutes a day, 3 days per week for initial exercise prescription. Provide home exercise prescription and guidelines that participant acknowledges understanding prior to discharge.  Activity Barriers & Risk Stratification:     Activity Barriers & Risk Stratification - 03/01/15 1024    Activity Barriers & Risk Stratification   Risk Stratification High      6 Minute Walk:     6 Minute Walk      02/28/15 0957       6 Minute Walk   Phase Initial     Distance 1570 feet     Walk Time 6 minutes     Resting HR 75 bpm     Resting BP 124/80 mmHg     Max Ex. HR 110 bpm     Max Ex. BP 126/74 mmHg     RPE 11     Symptoms No        Initial Exercise Prescription:     Initial Exercise Prescription - 02/28/15 1000    Date of Initial Exercise Prescription   Date 02/28/15  Treadmill   MPH 2.8   Grade 0   Minutes 10   Bike   Level 0.4   Minutes 10   Recumbant Bike   Level 3   RPM 40   Watts 25   Minutes 15   NuStep   Level 2   Watts 20   Minutes 15   Arm Ergometer   Level 1   Watts 10   Minutes 10   Arm/Foot Ergometer   Level 4   Watts 12   Minutes 10   Cybex   Level 2   RPM 50   Minutes 15   Recumbant Elliptical   Level 1   RPM 40   Watts 10   Minutes 15   REL-XR   Level 2   Watts 25   Minutes 15   Prescription Details   Frequency (times per week) 3   Duration Progress to 30 minutes of continuous aerobic without signs/symptoms of physical distress   Intensity   THRR REST +  30   Ratings of Perceived Exertion 11-15   Progression Continue progressive overload as per policy without signs/symptoms or physical distress.   Resistance Training   Training  Prescription Yes   Weight 2   Reps 10-15      Exercise Prescription Changes:   Discharge Exercise Prescription (Final Exercise Prescription Changes):   Nutrition:  Target Goals: Understanding of nutrition guidelines, daily intake of sodium 1500mg , cholesterol 200mg , calories 30% from fat and 7% or less from saturated fats, daily to have 5 or more servings of fruits and vegetables.  Biometrics:     Pre Biometrics - 02/28/15 0956    Pre Biometrics   Height 5\' 10"  (1.778 m)   Weight 209 lb 11.2 oz (95.119 kg)   Waist Circumference 39 inches   Hip Circumference 43.5 inches   Waist to Hip Ratio 0.9 %   BMI (Calculated) 30.2       Nutrition Therapy Plan and Nutrition Goals:     Nutrition Therapy & Goals - 02/28/15 1626    Nutrition Therapy   Drug/Food Interactions Statins/Certain Fruits   Intervention Plan   Intervention Using nutrition plan and personal goals to gain a healthy nutrition lifestyle. Add exercise as prescribed.      Nutrition Discharge: Rate Your Plate Scores:   Nutrition Goals Re-Evaluation:   Psychosocial: Target Goals: Acknowledge presence or absence of depression, maximize coping skills, provide positive support system. Participant is able to verbalize types and ability to use techniques and skills needed for reducing stress and depression.  Initial Review & Psychosocial Screening:     Initial Psych Review & Screening - 03/01/15 1028    Initial Review   Current issues with Current Stress Concerns  Caitlyn Roth and her family have had multiple heatlh problems. Son Caitlyn Roth Caitlyn Roth heart surgery as a infant, Caitlyn Roth is waiting for a Caitlyn Roth transplant. He was in Caitlyn Roth 6   Family Dynamics   Good Support System? Yes   Barriers   Psychosocial barriers to participate in program The patient should benefit from training in stress management and relaxation.   Screening Interventions   Interventions Encouraged to exercise;Program counselor consult       Quality of Life Scores:     Quality of Life - 03/01/15 1508    Quality of Life Scores   Health/Function Pre 21.37 %   Socioeconomic Pre 29.14 %   Psych/Spiritual Pre 23.14 %   Family Pre 25.2 %   GLOBAL Pre  23.9 %      PHQ-9:     Recent Review Flowsheet Data    Depression screen [facility] 2/9 02/28/2015   Decreased Interest 1   Down, Depressed, Hopeless 0   PHQ - 2 Score 1   Altered sleeping 2   Tired, decreased energy 2   Change in appetite 1   Feeling bad or failure about yourself  0   Trouble concentrating 0   Moving slowly or fidgety/restless 0   Suicidal thoughts 0   PHQ-9 Score 6   Difficult doing work/chores Not difficult at all      Psychosocial Evaluation and Intervention:     Psychosocial Evaluation - 03/01/15 1730    Psychosocial Evaluation & Interventions   Comments Counselor met with Ms. Torpey today for initial psychosocial evaluation.   She is a 49 year old female who had a heart attack and stent insertion about a month ago.  She has a spouse of 15 years  and lives near his family (who are not considered very supportive)  Ms. Nacke has multiple health issues with Graves Disease, and ADHD which is treated with medication.  She admits that she does not sleep well and is need of a sleep study but "tired of doctors currently  Her appetite is good.  She admits to a history of anxiety but denies depressive symptoms in the past or currently.  She takes medication for anxiety as needed.  Ms. Hochman reports she has multiple stressors in her life at this time with spouse's brother living in her home for the past 3 years, and conflict with him as well as spouses's family in general.  Also, Ms. Bemus reports her spouse  has been sick the past year and had an episode recently that was concerning.  Counselor discussed stress management techniques with Ms. Lykins during this time.  Ms. Troupe has goals to exercise with greater ease in the future, increase her stamina and  strength and hopefully lose some weight.  Counselor encouraged Ms. Biscardi to consider asking her doctor for a sleep study in order to promote overall health and wellness and expedite recovery from her cardiac procedure.       Psychosocial Re-Evaluation:   Vocational Rehabilitation: Provide vocational Roth assistance to qualifying candidates.   Vocational Roth Evaluation & Intervention:     Vocational Roth - 03/01/15 1024    Initial Vocational Roth Evaluation & Intervention   Assessment shows need for Vocational Rehabilitation No      Education: Education Goals: Education classes will be provided on a weekly basis, covering required topics. Participant will state understanding/return demonstration of topics presented.  Learning Barriers/Preferences:     Learning Barriers/Preferences - 03/01/15 1024    Learning Barriers/Preferences   Learning Barriers None   Learning Preferences None      Education Topics: General Nutrition Guidelines/Fats and Fiber: -Group instruction provided by verbal, written material, models and posters to present the general guidelines for heart healthy nutrition. Gives an explanation and review of dietary fats and fiber.   Controlling Sodium/Reading Food Labels: -Group verbal and written material supporting the discussion of sodium use in heart healthy nutrition. Review and explanation with models, verbal and written materials for utilization of the food label.   Exercise Physiology & Risk Factors: - Group verbal and written instruction with models to review the exercise physiology of the cardiovascular system and associated critical values. Details cardiovascular disease risk factors and the goals associated with each risk factor.   Aerobic  Exercise & Resistance Training: - Gives group verbal and written discussion on the health impact of inactivity. On the components of aerobic and resistive training programs and the benefits of this training  and how to safely progress through these programs.   Flexibility, Balance, General Exercise Guidelines: - Provides group verbal and written instruction on the benefits of flexibility and balance training programs. Provides general exercise guidelines with specific guidelines to those with heart or Caitlyn Roth disease. Demonstration and skill practice provided.   Stress Management: - Provides group verbal and written instruction about the health risks of elevated stress, cause of high stress, and healthy ways to reduce stress.   Depression: - Provides group verbal and written instruction on the correlation between heart/Caitlyn Roth disease and depressed mood, treatment options, and the stigmas associated with seeking treatment.   Anatomy & Physiology of the Heart: - Group verbal and written instruction and models provide basic cardiac anatomy and physiology, with the coronary electrical and arterial systems. Review of: AMI, Angina, Valve disease, Heart Failure, Cardiac Arrhythmia, Pacemakers, and the ICD.   Cardiac Procedures: - Group verbal and written instruction and models to describe the testing methods done to diagnose heart disease. Reviews the outcomes of the test results. Describes the treatment choices: Medical Management, Angioplasty, or Coronary Bypass Surgery.   Cardiac Medications: - Group verbal and written instruction to review commonly prescribed medications for heart disease. Reviews the medication, class of the drug, and side effects. Includes the steps to properly store meds and maintain the prescription regimen.   Go Sex-Intimacy & Heart Disease, Get SMART - Goal Setting: - Group verbal and written instruction through game format to discuss heart disease and the return to sexual intimacy. Provides group verbal and written material to discuss and apply goal setting through the application of the S.M.A.R.T. Method.   Other Matters of the Heart: - Provides group verbal, written  materials and models to describe Heart Failure, Angina, Valve Disease, and Diabetes in the realm of heart disease. Includes description of the disease process and treatment options available to the cardiac patient.   Exercise & Equipment Safety: - Individual verbal instruction and demonstration of equipment use and safety with use of the equipment.          Cardiac Roth from 02/28/2015 in [facility] Cardiac Roth   Date  03/01/15   Educator  C. Arnell Slivinski,RN   Instruction Review Code  1- partially meets, needs review/practice      Infection Prevention: - Provides verbal and written material to individual with discussion of infection control including proper hand washing and proper equipment cleaning during exercise session.      Cardiac Roth from 02/28/2015 in [facility] Cardiac Roth   Date  03/01/15   Educator  C. Charistopher Rumble,RN   Instruction Review Code  2- meets goals/outcomes      Falls Prevention: - Provides verbal and written material to individual with discussion of falls prevention and safety.      Cardiac Roth from 02/28/2015 in [facility] Cardiac Roth   Date  03/01/15   Educator  C. ENterkinRN   Instruction Review Code  2- meets goals/outcomes      Diabetes: - Individual verbal and written instruction to review signs/symptoms of diabetes, desired ranges of glucose level fasting, after meals and with exercise. Advice that pre and post exercise glucose checks will be done for 3 sessions at entry of program.      Cardiac Roth from 02/28/2015 in [facility] Cardiac Roth   Date  Cardiac Individual Treatment Plan  Patient Details  Name: Caitlyn Roth MRN: [id number] Date of Birth: April 01, 1966 Referring Provider:  Teodoro Spray, MD  Initial Encounter Date: Date: 02/28/15  Visit Diagnosis: Status post myocardial infarction - Plan: CARDIAC Roth 24 DAY REVIEW  Patient's Home Medications on Admission:  Current outpatient prescriptions:  .  amphetamine-dextroamphetamine (ADDERALL XR) 30 MG 24 hr capsule, Take 30 mg by mouth., Disp: , Rfl:  .  atenolol (TENORMIN) 50 MG tablet, Take 100 mg by mouth., Disp: , Rfl:  .  cyclobenzaprine (FLEXERIL) 10 MG tablet, , Disp: , Rfl:  .  esomeprazole (NEXIUM) 40 MG capsule, Take 40 mg by mouth., Disp: , Rfl:  .  meloxicam (MOBIC) 7.5 MG tablet, , Disp: , Rfl:  .  methimazole (TAPAZOLE) 5 MG tablet, Take one-half tablet by mouth daily, Disp: , Rfl:  .  methocarbamol (ROBAXIN) 750 MG tablet, , Disp: , Rfl:  .  montelukast (SINGULAIR) 10 MG tablet, Take 10 mg by mouth., Disp: , Rfl:  .  venlafaxine XR (EFFEXOR-XR) 150 MG 24 hr capsule, Take 150 mg by mouth., Disp: , Rfl:  .  atorvastatin (LIPITOR) 20 MG tablet, Take 20 mg by mouth., Disp: , Rfl:  .  Calcium Carb-Ergocalciferol 500-200 MG-UNIT TABS, Take by mouth., Disp: , Rfl:  .  desloratadine (CLARINEX) 5 MG tablet, Take 5 mg by mouth., Disp: , Rfl:  .  ferrous sulfate 325 (65 FE) MG tablet, Take 325 mg by mouth., Disp: , Rfl:  .  Liraglutide 18 MG/3ML SOPN, Inject 1.8 mg into the skin., Disp: , Rfl:   Past Medical History: Past Medical History  Diagnosis Date  . Hypertension   . Asthma   . Hyperlipemia   . Graves disease     Tobacco Use: History  Smoking status  . Former Smoker -- 1.00 packs/day for 35 years  . Types: Cigarettes  . Quit date: 01/27/2015  Smokeless tobacco  . Never Used    Labs: Recent Review Flowsheet Data    There is no flowsheet data to display.       Exercise Target Goals: Date: 02/28/15  Exercise Program Goal: Individual exercise  prescription set with THRR, safety & activity barriers. Participant demonstrates ability to understand and report RPE using BORG scale, to self-measure pulse accurately, and to acknowledge the importance of the exercise prescription.  Exercise Prescription Goal: Starting with aerobic activity 30 plus minutes a day, 3 days per week for initial exercise prescription. Provide home exercise prescription and guidelines that participant acknowledges understanding prior to discharge.  Activity Barriers & Risk Stratification:     Activity Barriers & Risk Stratification - 03/01/15 1024    Activity Barriers & Risk Stratification   Risk Stratification High      6 Minute Walk:     6 Minute Walk      02/28/15 0957       6 Minute Walk   Phase Initial     Distance 1570 feet     Walk Time 6 minutes     Resting HR 75 bpm     Resting BP 124/80 mmHg     Max Ex. HR 110 bpm     Max Ex. BP 126/74 mmHg     RPE 11     Symptoms No        Initial Exercise Prescription:     Initial Exercise Prescription - 02/28/15 1000    Date of Initial Exercise Prescription   Date 02/28/15

## 2015-03-01 NOTE — Addendum Note (Signed)
Addended by: Virgina Organ on: 03/01/2015 06:11 PM   Modules accepted: Orders

## 2015-03-01 NOTE — Progress Notes (Signed)
Daily Session Note  Patient Details  Name: Caitlyn Roth MRN: [id number] Date of Birth: [birth year] Referring Provider:  Teodoro Spray, MD  Encounter Date: 02/28/2015  Check In:      Goals Met:  Proper associated with RPD/PD & O2 Sat Exercise tolerated well Personal goals reviewed  Goals Unmet:  Not Applicable  Goals Comments: Orientation appt on 02/28/2015.    Dr. [name] is Medical Director for [place] and [facility].

## 2015-03-02 DIAGNOSIS — I252 Old myocardial infarction: Secondary | ICD-10-CM | POA: Diagnosis not present

## 2015-03-02 LAB — GLUCOSE, CAPILLARY
GLUCOSE-CAPILLARY: 119 mg/dL — AB (ref 65–99)
GLUCOSE-CAPILLARY: 99 mg/dL (ref 65–99)

## 2015-03-02 NOTE — Progress Notes (Signed)
Daily Session Note  Patient Details  Name: NOLLIE SHIFLETT MRN: [id number] Date of Birth: January 21, 1966 Referring Provider:  Teodoro Spray, MD  Encounter Date: 03/02/2015  Check In:     Session Check In - 03/02/15 1749    Check-In   Staff Present Gerlene Burdock RN, BSN;Brooklynn Brandenburg BS, ACSM EP-C, Exercise Physiologist;Diane Mariana Arn, BSN   ER physicians immediately available to respond to emergencies See telemetry face sheet for immediately available ER MD   Medication changes reported     No   Fall or balance concerns reported    No   Warm-up and Cool-down Performed on first and last piece of equipment   VAD Patient? No   Pain Assessment   Currently in Pain? No/denies         Goals Met:  Proper associated with RPD/PD & O2 Sat Exercise tolerated well No report of cardiac concerns or symptoms Strength training completed today  Goals Unmet:  Not Applicable  Goals Comments:    Dr. [name] is Medical Director for [place] and [facility].

## 2015-03-13 DIAGNOSIS — I252 Old myocardial infarction: Secondary | ICD-10-CM | POA: Diagnosis not present

## 2015-03-13 LAB — GLUCOSE, CAPILLARY
GLUCOSE-CAPILLARY: 127 mg/dL — AB (ref 65–99)
Glucose-Capillary: 117 mg/dL — ABNORMAL HIGH (ref 65–99)

## 2015-03-13 NOTE — Progress Notes (Signed)
Daily Session Note  Patient Details  Name: Caitlyn Roth MRN: [id number] Date of Birth: [birth year] Referring Provider:  Lovell Sheehan Catherin*  Encounter Date: 03/13/2015  Check In:     Session Check In - 03/13/15 1643    Check-In   Staff Present Heath Lark RN, BSN, CCRP;Odarius Dines BS, ACSM EP-C, Exercise Physiologist;Carroll Radio producer, BSN   ER physicians immediately available to respond to emergencies See telemetry face sheet for immediately available ER MD   Medication changes reported     No   Fall or balance concerns reported    No   Warm-up and Cool-down Performed on first and last piece of equipment   VAD Patient? No   Pain Assessment   Currently in Pain? No/denies         Goals Met:  Proper associated with RPD/PD & O2 Sat Exercise tolerated well No report of cardiac concerns or symptoms Strength training completed today  Goals Unmet:  Not Applicable  Goals Comments:    Dr. [name] is Medical Director for [place] and [facility].

## 2015-03-13 NOTE — Progress Notes (Signed)
Daily Session Note  Patient Details  Name: Caitlyn Roth MRN: [id number] Date of Birth: [birth year] Referring Provider:  Lovell Sheehan Catherin*  Encounter Date: 03/13/2015  Check In:     Session Check In - 03/13/15 1643    Check-In   Staff Present Heath Lark RN, BSN, CCRP;Steven Way BS, ACSM EP-C, Exercise Physiologist;Carroll Radio producer, BSN   ER physicians immediately available to respond to emergencies See telemetry face sheet for immediately available ER MD   Medication changes reported     No   Fall or balance concerns reported    No   Warm-up and Cool-down Performed on first and last piece of equipment   VAD Patient? No   Pain Assessment   Currently in Pain? No/denies         Goals Met:  Exercise tolerated well No report of cardiac concerns or symptoms  Goals Unmet:  Not Applicable  Goals Comments: [name] completed session today without stretching. She had to leave to run to store before going home.   Dr. [name] is Medical Director for [place] and [facility].

## 2015-03-15 ENCOUNTER — Encounter: Payer: BLUE CROSS/BLUE SHIELD | Admitting: *Deleted

## 2015-03-15 DIAGNOSIS — I252 Old myocardial infarction: Secondary | ICD-10-CM

## 2015-03-15 NOTE — Progress Notes (Signed)
Daily Session Note  Patient Details  Name: Caitlyn Roth MRN: [id number] Date of Birth: [birth year] Referring Provider:  Teodoro Spray, MD  Encounter Date: 03/15/2015  Check In:     Session Check In - 03/15/15 1623    Check-In   Staff Present Gerlene Burdock RN, BSN;Renee Dillard Essex MS, ACSM CEP Exercise Physiologist;Beatryce Colombo Mariana Arn, BSN   ER physicians immediately available to respond to emergencies See telemetry face sheet for immediately available ER MD   Medication changes reported     No   Fall or balance concerns reported    No   Warm-up and Cool-down Performed on first and last piece of equipment   VAD Patient? No   Pain Assessment   Currently in Pain? No/denies           Exercise Prescription Changes - 03/15/15 1700    Exercise Review   Progression Yes   Treadmill   MPH 3.5   Grade 0   Minutes 15   REL-XR   Level 5   Watts 80   Minutes 15      Goals Met:  Independence with exercise equipment Exercise tolerated well Personal goals reviewed No report of cardiac concerns or symptoms Strength training completed today  Goals Unmet:  Not Applicable  Goals Comments:  Patient on treadmill for 25 minutes today including warm-up.     Dr. [name] is Medical Director for [place] and [facility].

## 2015-03-16 DIAGNOSIS — I252 Old myocardial infarction: Secondary | ICD-10-CM | POA: Diagnosis not present

## 2015-03-16 NOTE — Progress Notes (Signed)
Daily Session Note  Patient Details  Name: Caitlyn Roth MRN: [id number] Date of Birth: Jan 21, 1966 Referring Provider:  Lovell Sheehan Catherin*  Encounter Date: 03/16/2015  Check In:     Session Check In - 03/16/15 1633    Check-In   Staff Present Lestine Box BS, ACSM EP-C, Exercise Physiologist;Carroll Enterkin RN, BSN;Diane Joya Gaskins RN, BSN   ER physicians immediately available to respond to emergencies See telemetry face sheet for immediately available ER MD   Medication changes reported     No   Fall or balance concerns reported    No   Warm-up and Cool-down Performed on first and last piece of equipment   VAD Patient? No   Pain Assessment   Currently in Pain? No/denies           Exercise Prescription Changes - 03/15/15 1700    Exercise Review   Progression Yes   Treadmill   MPH 3.5   Grade 0   Minutes 15   REL-XR   Level 5   Watts 80   Minutes 15      Goals Met:  Proper associated with RPD/PD & O2 Sat Exercise tolerated well No report of cardiac concerns or symptoms Strength training completed today  Goals Unmet:  Not Applicable  Goals Comments:   Dr. [name] is Medical Director for [place] and [facility].

## 2015-03-20 ENCOUNTER — Encounter: Payer: Self-pay | Admitting: *Deleted

## 2015-03-20 DIAGNOSIS — I252 Old myocardial infarction: Secondary | ICD-10-CM

## 2015-03-20 NOTE — Progress Notes (Signed)
Daily Session Note  Patient Details  Name: Caitlyn Roth MRN: [id number] Date of Birth: 10-28-1965 Referring Provider:  Teodoro Spray, MD  Encounter Date: 03/20/2015  Check In:     Session Check In - 03/20/15 1632    Check-In   Staff Present Lestine Box BS, ACSM EP-C, Exercise Physiologist;Susanne Bice RN, BSN, CCRP;Carroll Enterkin RN, BSN   ER physicians immediately available to respond to emergencies See telemetry face sheet for immediately available ER MD   Medication changes reported     No   Fall or balance concerns reported    No   Warm-up and Cool-down Performed on first and last piece of equipment   VAD Patient? No   Pain Assessment   Currently in Pain? No/denies           Exercise Prescription Changes - 03/20/15 1200    Exercise Review   Progression Yes   Response to Exercise   Blood Pressure (Admit) 122/84 mmHg   Blood Pressure (Exercise) 158/84 mmHg   Blood Pressure (Exit) 118/82 mmHg   Heart Rate (Admit) 90 bpm   Heart Rate (Exercise) 114 bpm   Heart Rate (Exit) 95 bpm   Rating of Perceived Exertion (Exercise) 9   Symptoms None   Comments Reviewed individualized exercise prescription and made increases per departmental policy. Exercise increases were discussed with the patient and they were able to perform the new work loads without issue (no signs or symptoms).    Duration Progress to 30 minutes of continuous aerobic without signs/symptoms of physical distress   Intensity Rest + 30   Progression Continue progressive overload as per policy without signs/symptoms or physical distress.   Resistance Training   Training Prescription Yes   Weight 5   Reps 10-15   Interval Training   Interval Training No   Treadmill   MPH 3.5   Grade 0   Minutes 20   NuStep   Level 5   Watts 45   Minutes 20   REL-XR   Level 5   Watts 80   Minutes 15      Goals Met:  Proper associated with RPD/PD & O2 Sat Exercise tolerated well No report of cardiac concerns  or symptoms Strength training completed today  Goals Unmet:  Not Applicable  Goals Comments:    Dr. [name] is Medical Director for [facility].

## 2015-03-22 ENCOUNTER — Encounter: Payer: BLUE CROSS/BLUE SHIELD | Attending: Cardiology

## 2015-03-22 DIAGNOSIS — I252 Old myocardial infarction: Secondary | ICD-10-CM | POA: Insufficient documentation

## 2015-03-23 ENCOUNTER — Encounter: Payer: BLUE CROSS/BLUE SHIELD | Admitting: *Deleted

## 2015-03-23 DIAGNOSIS — I252 Old myocardial infarction: Secondary | ICD-10-CM

## 2015-03-23 NOTE — Progress Notes (Signed)
Daily Session Note  Patient Details  Name: JESSIKAH DICKER MRN: [id number] Date of Birth: August 30, 1965 Referring Provider:  Teodoro Spray, MD  Encounter Date: 03/23/2015  Check In:     Session Check In - 03/23/15 1654    Check-In   Staff Present Gerlene Burdock RN, BSN;Steven Way BS, ACSM EP-C, Exercise Physiologist;Diane Mariana Arn, BSN   ER physicians immediately available to respond to emergencies See telemetry face sheet for immediately available ER MD   Medication changes reported     No   Fall or balance concerns reported    No   Warm-up and Cool-down Performed on first and last piece of equipment   VAD Patient? No   Pain Assessment   Currently in Pain? No/denies         Goals Met:  Proper associated with RPD/PD & O2 Sat Exercise tolerated well No report of cardiac concerns or symptoms  Goals Unmet:  Not Applicable  Goals Comments:    Dr. [name] is Medical Director for [place].

## 2015-03-27 DIAGNOSIS — I252 Old myocardial infarction: Secondary | ICD-10-CM | POA: Diagnosis not present

## 2015-03-29 ENCOUNTER — Other Ambulatory Visit: Payer: Self-pay | Admitting: *Deleted

## 2015-03-29 DIAGNOSIS — I252 Old myocardial infarction: Secondary | ICD-10-CM

## 2015-04-03 ENCOUNTER — Encounter: Payer: BLUE CROSS/BLUE SHIELD | Admitting: *Deleted

## 2015-04-03 DIAGNOSIS — I252 Old myocardial infarction: Secondary | ICD-10-CM

## 2015-04-03 NOTE — Progress Notes (Signed)
Daily Session Note  Patient Details  Name: Caitlyn Roth MRN: [id number] Date of Birth: Sep 17, 1965 Referring Provider:  Lovell Sheehan Catherin*  Encounter Date: 04/03/2015  Check In:     Session Check In - 04/03/15 1624    Check-In   Staff Present Heath Lark RN, BSN, CCRP;Carroll Enterkin RN, BSN;Steven Way BS, ACSM EP-C, Exercise Physiologist   ER physicians immediately available to respond to emergencies See telemetry face sheet for immediately available ER MD   Medication changes reported     Yes   Fall or balance concerns reported    No   Warm-up and Cool-down Performed on first and last piece of equipment   VAD Patient? No   Pain Assessment   Currently in Pain? No/denies           Exercise Prescription Changes - 04/03/15 1600    Exercise Review   Progression Yes   Response to Exercise   Blood Pressure (Admit) 122/84 mmHg   Blood Pressure (Exercise) 158/84 mmHg   Blood Pressure (Exit) 118/82 mmHg   Heart Rate (Admit) 90 bpm   Heart Rate (Exercise) 114 bpm   Heart Rate (Exit) 95 bpm   Rating of Perceived Exertion (Exercise) 9   Symptoms None   Comments Reviewed individualized exercise prescription and made increases per departmental policy. Exercise increases were discussed with the patient and they were able to perform the new work loads without issue (no signs or symptoms).    Duration Progress to 30 minutes of continuous aerobic without signs/symptoms of physical distress   Intensity Rest + 30   Progression Continue progressive overload as per policy without signs/symptoms or physical distress.   Resistance Training   Training Prescription Yes   Weight 5   Reps 10-15   Interval Training   Interval Training No   Treadmill   MPH 3.5   Grade 2   Minutes 20   NuStep   Level 5   Watts 45   Minutes 20   REL-XR   Level 7   Watts 80   Minutes 15      Goals Met:  Exercise tolerated well No report of cardiac concerns or symptoms  Goals Unmet:  Not  Applicable  Goals Comments: Doing well with exercise prescription progression.    Dr. [name] is Medical Director for [facility].

## 2015-04-05 ENCOUNTER — Encounter: Payer: BLUE CROSS/BLUE SHIELD | Admitting: *Deleted

## 2015-04-05 DIAGNOSIS — I252 Old myocardial infarction: Secondary | ICD-10-CM | POA: Diagnosis not present

## 2015-04-05 NOTE — Progress Notes (Signed)
Daily Session Note  Patient Details  Name: ROSSIE SCARFONE MRN: [id number] Date of Birth: 03-Sep-1965 Referring Provider:  Teodoro Spray, MD  Encounter Date: 04/05/2015  Check In:     Session Check In - 04/05/15 1636    Check-In   Staff Present Gerlene Burdock RN, BSN;Keierra Nudo Dillard Essex MS, ACSM CEP Exercise Physiologist;Diane Mariana Arn, BSN   ER physicians immediately available to respond to emergencies See telemetry face sheet for immediately available ER MD   Medication changes reported     No   Fall or balance concerns reported    No   Warm-up and Cool-down Performed on first and last piece of equipment   VAD Patient? No   Pain Assessment   Currently in Pain? No/denies   Multiple Pain Sites No         Goals Met:  Independence with exercise equipment Exercise tolerated well No report of cardiac concerns or symptoms Strength training completed today  Goals Unmet:  Not Applicable  Goals Comments: Patient completed exercise prescription and all exercise goals during rehab session. The exercise was tolerated well and the patient is progressing in the program.    Dr. [name] is Medical Director for [place] [facility].

## 2015-04-11 ENCOUNTER — Encounter: Payer: Self-pay | Admitting: Emergency Medicine

## 2015-04-11 ENCOUNTER — Emergency Department
Admission: EM | Admit: 2015-04-11 | Discharge: 2015-04-11 | Disposition: A | Discharge status: Home / Self Care | Payer: BLUE CROSS/BLUE SHIELD | Attending: Emergency Medicine | Admitting: Emergency Medicine

## 2015-04-11 ENCOUNTER — Emergency Department: Payer: BLUE CROSS/BLUE SHIELD

## 2015-04-11 DIAGNOSIS — I891 Lymphangitis: Secondary | ICD-10-CM | POA: Diagnosis not present

## 2015-04-11 DIAGNOSIS — J45909 Unspecified asthma, uncomplicated: Secondary | ICD-10-CM | POA: Diagnosis not present

## 2015-04-11 DIAGNOSIS — R0789 Other chest pain: Secondary | ICD-10-CM | POA: Diagnosis present

## 2015-04-11 DIAGNOSIS — I10 Essential (primary) hypertension: Secondary | ICD-10-CM | POA: Diagnosis not present

## 2015-04-11 DIAGNOSIS — F17210 Nicotine dependence, cigarettes, uncomplicated: Secondary | ICD-10-CM | POA: Insufficient documentation

## 2015-04-11 DIAGNOSIS — M7981 Nontraumatic hematoma of soft tissue: Secondary | ICD-10-CM | POA: Diagnosis not present

## 2015-04-11 DIAGNOSIS — J069 Acute upper respiratory infection, unspecified: Secondary | ICD-10-CM | POA: Diagnosis not present

## 2015-04-11 LAB — CBC
HCT: 40.7 % (ref 35.0–47.0)
Hemoglobin: 14 g/dL (ref 12.0–16.0)
MCH: 30.1 pg (ref 26.0–34.0)
MCHC: 34.3 g/dL (ref 32.0–36.0)
MCV: 87.7 fL (ref 80.0–100.0)
PLATELETS: 183 10*3/uL (ref 150–440)
RBC: 4.64 MIL/uL (ref 3.80–5.20)
RDW: 13.7 % (ref 11.5–14.5)
WBC: 10.2 10*3/uL (ref 3.6–11.0)

## 2015-04-11 LAB — BASIC METABOLIC PANEL
Anion gap: 11 (ref 5–15)
BUN: 13 mg/dL (ref 6–20)
CALCIUM: 9.4 mg/dL (ref 8.9–10.3)
CO2: 23 mmol/L (ref 22–32)
CREATININE: 1.01 mg/dL — AB (ref 0.44–1.00)
Chloride: 100 mmol/L — ABNORMAL LOW (ref 101–111)
GFR calc Af Amer: >60 mL/min (ref >60)
GLUCOSE: 169 mg/dL — AB (ref 65–99)
Potassium: 3.3 mmol/L — ABNORMAL LOW (ref 3.5–5.1)
SODIUM: 134 mmol/L — AB (ref 135–145)

## 2015-04-11 LAB — TROPONIN I: Troponin I: <0.03 ng/mL (ref <0.031)

## 2015-04-11 MED ORDER — VANCOMYCIN HCL IN DEXTROSE 1-5 GM/200ML-% IV SOLN
1000.0000 mg | Freq: Once | INTRAVENOUS | Status: AC
  Administered 2015-04-11: 1000 mg via INTRAVENOUS
  Filled 2015-04-11: qty 200

## 2015-04-11 MED ORDER — ONDANSETRON HCL 4 MG/2ML IJ SOLN
INTRAMUSCULAR | Status: AC
  Administered 2015-04-11: 4 mg via INTRAVENOUS
  Filled 2015-04-11: qty 2

## 2015-04-11 MED ORDER — HYDROCOD POLST-CPM POLST ER 10-8 MG/5ML PO SUER: 5.0000 mL | Freq: Two times a day (BID) | ORAL | Status: DC

## 2015-04-11 MED ORDER — SULFAMETHOXAZOLE-TRIMETHOPRIM 800-160 MG PO TABS: 1.0000 | ORAL_TABLET | Freq: Two times a day (BID) | ORAL | Status: DC

## 2015-04-11 MED ORDER — SODIUM CHLORIDE 0.9 % IV SOLN
Freq: Once | INTRAVENOUS | Status: AC
  Administered 2015-04-11: 14:00:00 via INTRAVENOUS

## 2015-04-11 MED ORDER — MUPIROCIN 2 % EX OINT: TOPICAL_OINTMENT | CUTANEOUS | Status: DC

## 2015-04-11 MED ORDER — ONDANSETRON HCL 4 MG/2ML IJ SOLN
4.0000 mg | Freq: Once | INTRAMUSCULAR | Status: AC
  Administered 2015-04-11: 4 mg via INTRAVENOUS

## 2015-04-11 NOTE — ED Notes (Signed)
Pt presents with chest pressure started three days ago and then noticed a black bruised area to right big toe this am.

## 2015-04-11 NOTE — ED Notes (Signed)
Discussed discharge instructions, prescriptions, and follow-up care with patient. No questions or concerns at this time. Pt stable at discharge.  

## 2015-04-11 NOTE — ED Notes (Signed)
Pt returned from X-ray.  

## 2015-04-11 NOTE — Discharge Instructions (Signed)
Upper Respiratory Infection, Adult °Most upper respiratory infections (URIs) are a viral infection of the air passages leading to the lungs. A URI affects the nose, throat, and upper air passages. The most common type of URI is nasopharyngitis and is typically referred to as "the common cold." °URIs run their course and usually go away on their own. Most of the time, a URI does not require medical attention, but sometimes a bacterial infection in the upper airways can follow a viral infection. This is called a secondary infection. Sinus and middle ear infections are common types of secondary upper respiratory infections. °Bacterial pneumonia can also complicate a URI. A URI can worsen asthma and chronic obstructive pulmonary disease (COPD). Sometimes, these complications can require emergency medical care and may be life threatening.  °CAUSES °Almost all URIs are caused by viruses. A virus is a type of germ and can spread from one person to another.  °RISKS FACTORS °You may be at risk for a URI if:  °· You smoke.   °· You have chronic heart or lung disease. °· You have a weakened defense (immune) system.   °· You are very young or very old.   °· You have nasal allergies or asthma. °· You work in crowded or poorly ventilated areas. °· You work in health care facilities or schools. °SIGNS AND SYMPTOMS  °Symptoms typically develop 2-3 days after you come in contact with a cold virus. Most viral URIs last 7-10 days. However, viral URIs from the influenza virus (flu virus) can last 14-18 days and are typically more severe. Symptoms may include:  °· Runny or stuffy (congested) nose.   °· Sneezing.   °· Cough.   °· Sore throat.   °· Headache.   °· Fatigue.   °· Fever.   °· Loss of appetite.   °· Pain in your forehead, behind your eyes, and over your cheekbones (sinus pain). °· Muscle aches.   °DIAGNOSIS  °Your health care provider may diagnose a URI by: °· Physical exam. °· Tests to check that your symptoms are not due to  another condition such as: °· Strep throat. °· Sinusitis. °· Pneumonia. °· Asthma. °TREATMENT  °A URI goes away on its own with time. It cannot be cured with medicines, but medicines may be prescribed or recommended to relieve symptoms. Medicines may help: °· Reduce your fever. °· Reduce your cough. °· Relieve nasal congestion. °HOME CARE INSTRUCTIONS  °· Take medicines only as directed by your health care provider.   °· Gargle warm saltwater or take cough drops to comfort your throat as directed by your health care provider. °· Use a warm mist humidifier or inhale steam from a shower to increase air moisture. This may make it easier to breathe. °· Drink enough fluid to keep your urine clear or pale yellow.   °· Eat soups and other clear broths and maintain good nutrition.   °· Rest as needed.   °· Return to work when your temperature has returned to normal or as your health care provider advises. You may need to stay home longer to avoid infecting others. You can also use a face mask and careful hand washing to prevent spread of the virus. °· Increase the usage of your inhaler if you have asthma.   °· Do not use any tobacco products, including cigarettes, chewing tobacco, or electronic cigarettes. If you need help quitting, ask your health care provider. °PREVENTION  °The best way to protect yourself from getting a cold is to practice good hygiene.  °· Avoid oral or hand contact with people with cold   symptoms.   Wash your hands often if contact occurs.  There is no clear evidence that vitamin C, vitamin E, echinacea, or exercise reduces the chance of developing a cold. However, it is always recommended to get plenty of rest, exercise, and practice good nutrition.  SEEK MEDICAL CARE IF:   You are getting worse rather than better.   Your symptoms are not controlled by medicine.   You have chills.  You have worsening shortness of breath.  You have brown or red mucus.  You have yellow or brown nasal  discharge.  You have pain in your face, especially when you bend forward.  You have a fever.  You have swollen neck glands.  You have pain while swallowing.  You have white areas in the back of your throat. SEEK IMMEDIATE MEDICAL CARE IF:   You have severe or persistent:  Headache.  Ear pain.  Sinus pain.  Chest pain.  You have chronic lung disease and any of the following:  Wheezing.  Prolonged cough.  Coughing up blood.  A change in your usual mucus.  You have a stiff neck.  You have changes in your:  Vision.  Hearing.  Thinking.  Mood. MAKE SURE YOU:   Understand these instructions.  Will watch your condition.  Will get help right away if you are not doing well or get worse.   This information is not intended to replace advice given to you by your health care provider. Make sure you discuss any questions you have with your health care provider.   Document Released: 10/30/2000 Document Revised: 09/20/2014 Document Reviewed: 08/11/2013 Elsevier Interactive Patient Education 2016 Else[employer] Inc[employer] Cellulitis Cellulitis is an infection of the skin and the tissue beneath it. The infected area is usually red and tender. Cellulitis occurs most often in the arms and lower legs.  CAUSES  Cellulitis is caused by bacteria that enter the skin through cracks or cuts in the skin. The most common types of bacteria that cause cellulitis are staphylococci and streptococci. SIGNS AND SYMPTOMS   Redness and warmth.  Swelling.  Tenderness or pain.  Fever. DIAGNOSIS  Your health care provider can usually determine what is wrong based on a physical exam. Blood tests may also be done. TREATMENT  Treatment usually involves taking an antibiotic medicine. HOME CARE INSTRUCTIONS   Take your antibiotic medicine as directed by your health care provider. Finish the antibiotic even if you start to feel better.  Keep the infected arm or leg elevated to reduce  swelling.  Apply a warm cloth to the affected area up to 4 times per day to relieve pain.  Take medicines only as directed by your health care provider.  Keep all follow-up visits as directed by your health care provider. SEEK MEDICAL CARE IF:   You notice red streaks coming from the infected area.  Your red area gets larger or turns dark in color.  Your bone or joint underneath the infected area becomes painful after the skin has healed.  Your infection returns in the same area or another area.  You notice a swollen bump in the infected area.  You develop new symptoms.  You have a fever. SEEK IMMEDIATE MEDICAL CARE IF:   You feel very sleepy.  You develop vomiting or diarrhea.  You have a general ill feeling (malaise) with muscle aches and pains.   This information is not intended to replace advice given to you by your health care provider. Make sure you discuss any  questions you have with your health care provider.   Document Released: 02/13/2005 Document Revised: 01/25/2015 Document Reviewed: 07/22/2011 Elsevier Interactive Patient Education [employer]

## 2015-04-11 NOTE — ED Provider Notes (Signed)
[facility] Emergency Department Provider Note     Time seen: ----------------------------------------- 12:33 PM on 04/11/2015 -----------------------------------------    I have reviewed the triage vital signs and the nursing notes.   HISTORY  Chief Complaint Chest Pain and Foot Pain    HPI Caitlyn Roth is a 49 y.o. female present ER for chest pressure that started 3 days ago, fever or chills, cough and weakness. She noted a black bruise area to the right big toe this morning, nothing makes her symptoms better or worse.   Past Medical History  Diagnosis Date  . Hypertension   . Asthma   . Hyperlipemia   . Graves disease     There are no active problems to display for this patient.   Past Surgical History  Procedure Laterality Date  . Knee arthroscopy    . Cholecystectomy    . Tubal ligation    . Cesarean section    . Sinus surgery with instatrak    . Cardiac catheterization    . Coronary angioplasty    . Cardiac stents      Allergies Lac bovis; Acyclovir; Cefdinir; Codeine; and Other  Social History Social History  Substance Use Topics  . Smoking status: Former Smoker -- 1.00 packs/day for 35 years    Types: Cigarettes    Quit date: 01/27/2015  . Smokeless tobacco: Never Used  . Alcohol Use: Yes    Review of Systems Constitutional: Positive for fever and chills Eyes: Negative for visual changes. ENT: Negative for sore throat. Cardiovascular: Positive for chest pain Respiratory: Negative for shortness of breath. Positive for cough Gastrointestinal: Negative for abdominal pain, vomiting and diarrhea. Genitourinary: Negative for dysuria. Musculoskeletal: Negative for back pain. Skin: Positive for right great toe blister and erythema Neurological: Negative for headaches, positive for weakness  10-point ROS otherwise negative.  ____________________________________________   PHYSICAL EXAM:  VITAL SIGNS: ED Triage  Vitals  Enc Vitals Group     BP 04/11/15 1139 108/70 mmHg     Pulse Rate 04/11/15 1139 112     Resp 04/11/15 1139 20     Temp 04/11/15 1139 97.7 F (36.5 C)     Temp Source 04/11/15 1139 Oral     SpO2 04/11/15 1139 98 %     Weight 04/11/15 1139 210 lb (95.255 kg)     Height 04/11/15 1139  (1.753 m)     Head Cir --      Peak Flow --      Pain Score --      Pain Loc --      Pain Edu? --      Excl. in GC? --     Constitutional: Alert and oriented. Well appearing and in no distress. Eyes: Conjunctivae are normal. PERRL. Normal extraocular movements. ENT   Head: Normocephalic and atraumatic.   Nose: No congestion/rhinnorhea.   Mouth/Throat: Mucous membranes are moist.   Neck: No stridor. Cardiovascular: Normal rate, regular rhythm. Normal and symmetric distal pulses are present in all extremities. No murmurs, rubs, or gallops. Respiratory: Normal respiratory effort without tachypnea nor retractions. Breath sounds are clear and equal bilaterally. No wheezes/rales/rhonchi. Gastrointestinal: Soft and nontender. No distention. No abdominal bruits.  Musculoskeletal: Nontender with normal range of motion in all extremities. No joint effusions.  There is a large blister with a first metatarsal phalangeal joint with erythema that tracks up into the lower leg distally. Neurologic:  Normal speech and language. No gross focal neurologic deficits are appreciated.  Speech is normal. No gait instability. Skin:  Large blister formation in the right foot with lymphangitis up the right leg Psychiatric: Mood and affect are normal. Speech and behavior are normal. Patient exhibits appropriate insight and judgment. ____________________________________________  EKG: Interpreted by me. Sinus tachycardia with rate of 114 bpm, normal PR interval, normal tears with, long QT interval.  ____________________________________________  ED COURSE:  Pertinent labs & imaging results that were  available during my care of the patient were reviewed by me and considered in my medical decision making (see chart for details). Patient's no distress, does have some upper respiratory symptoms in addition to lymphangitis. She will receive IV antibiotics and will check labs. ____________________________________________    LABS (pertinent positives/negatives)  Labs Reviewed  BASIC METABOLIC PANEL - Abnormal; Notable for the following:    Sodium 134 (*)    Potassium 3.3 (*)    Chloride 100 (*)    Glucose, Bld 169 (*)    Creatinine, Ser 1.01 (*)    All other components within normal limits  CULTURE, BLOOD (ROUTINE X 2)  CULTURE, BLOOD (ROUTINE X 2)  WOUND CULTURE  TROPONIN I  CBC    RADIOLOGY  Chest x-ray, right foot x-ray IMPRESSION: No active disease. IMPRESSION: No acute bony abnormality. ____________________________________________  FINAL ASSESSMENT AND PLAN  Fever, URI, lymphangitis  Plan: Patient with labs and imaging as dictated above. Most of her symptoms are from upper respiratory infection, will prescribe Tussionex for this. She also has infection with slight lymphangitis in the right foot. She was given IV vancomycin here we discharged with Septra DS and encouraged to have follow-up in 2 days for recheck.   Caitlyn Filbert, MD   Caitlyn Filbert, MD 04/11/15 1400

## 2015-04-11 NOTE — ED Notes (Signed)
Patient transported to X-ray 

## 2015-04-14 LAB — WOUND CULTURE: Special Requests: NORMAL

## 2015-04-16 LAB — CULTURE, BLOOD (ROUTINE X 2)
CULTURE: NO GROWTH
Culture: NO GROWTH

## 2015-04-17 ENCOUNTER — Emergency Department
Admission: EM | Admit: 2015-04-17 | Discharge: 2015-04-17 | Disposition: A | Discharge status: Home / Self Care | Payer: BLUE CROSS/BLUE SHIELD | Attending: Emergency Medicine | Admitting: Emergency Medicine

## 2015-04-17 ENCOUNTER — Encounter: Payer: Self-pay | Admitting: Medical Oncology

## 2015-04-17 DIAGNOSIS — Z792 Long term (current) use of antibiotics: Secondary | ICD-10-CM | POA: Insufficient documentation

## 2015-04-17 DIAGNOSIS — Z4800 Encounter for change or removal of nonsurgical wound dressing: Secondary | ICD-10-CM | POA: Diagnosis present

## 2015-04-17 DIAGNOSIS — Z87891 Personal history of nicotine dependence: Secondary | ICD-10-CM | POA: Insufficient documentation

## 2015-04-17 DIAGNOSIS — I10 Essential (primary) hypertension: Secondary | ICD-10-CM | POA: Diagnosis not present

## 2015-04-17 DIAGNOSIS — Z79899 Other long term (current) drug therapy: Secondary | ICD-10-CM | POA: Diagnosis not present

## 2015-04-17 DIAGNOSIS — L089 Local infection of the skin and subcutaneous tissue, unspecified: Secondary | ICD-10-CM

## 2015-04-17 LAB — GLUCOSE, CAPILLARY: Glucose-Capillary: 129 mg/dL — ABNORMAL HIGH (ref 65–99)

## 2015-04-17 MED ORDER — CLINDAMYCIN HCL 150 MG PO CAPS: 300.0000 mg | ORAL_CAPSULE | Freq: Four times a day (QID) | ORAL | Status: DC

## 2015-04-17 NOTE — ED Notes (Signed)
States she was seen and placed on septra ds for wound to right great toe . States area is still draining  Has been soaking toe and having it air out at home but conts to have drainage and pain

## 2015-04-17 NOTE — ED Provider Notes (Signed)
[facility] Emergency Department Provider Note  ____________________________________________  Time seen: Approximately 7:27 AM  I have reviewed the triage vital signs and the nursing notes.   HISTORY  Chief Complaint Wound Check   HPI Caitlyn Roth is a 49 y.o. female is here with complaint of infection from the wound at her group right great toe. Patient states that she was placed on Septra DS and has been soaking her foot. She denies any fever or chills. She states she was told to return today for recheck of her wound. She states that the drainage has continued especially after soaking her foot. She complains of no pain.She is only concerned about there being a small hole from the outside of her foot down into the callus.   Past Medical History  Diagnosis Date  . Hypertension   . Asthma   . Hyperlipemia   . Graves disease     There are no active problems to display for this patient.   Past Surgical History  Procedure Laterality Date  . Knee arthroscopy    . Cholecystectomy    . Tubal ligation    . Cesarean section    . Sinus surgery with instatrak    . Cardiac catheterization    . Coronary angioplasty    . Cardiac stents      Current Outpatient Rx  Name  Route  Sig  Dispense  Refill  . amphetamine-dextroamphetamine (ADDERALL XR) 30 MG 24 hr capsule   Oral   Take 30 mg by mouth.         Marland Kitchen atenolol (TENORMIN) 50 MG tablet   Oral   Take 100 mg by mouth.         Marland Kitchen atorvastatin (LIPITOR) 20 MG tablet   Oral   Take 20 mg by mouth.         . Calcium Carb-Ergocalciferol 500-200 MG-UNIT TABS   Oral   Take by mouth.         . chlorpheniramine-HYDROcodone (TUSSIONEX PENNKINETIC ER) 10-8 MG/5ML SUER   Oral   Take 5 mLs by mouth 2 (two) times daily.   140 mL   0   . clindamycin (CLEOCIN) 150 MG capsule   Oral   Take 2 capsules (300 mg total) by mouth 4 (four) times daily.   56 capsule   0   . clopidogrel (PLAVIX) 75 MG  tablet   Oral   Take by mouth.         . cyclobenzaprine (FLEXERIL) 10 MG tablet               . desloratadine (CLARINEX) 5 MG tablet   Oral   Take 5 mg by mouth.         . esomeprazole (NEXIUM) 40 MG capsule   Oral   Take 40 mg by mouth.         . ferrous sulfate 325 (65 FE) MG tablet   Oral   Take 325 mg by mouth.         . Liraglutide 18 MG/3ML SOPN   Subcutaneous   Inject 1.8 mg into the skin.         . meloxicam (MOBIC) 7.5 MG tablet               . methimazole (TAPAZOLE) 5 MG tablet      Take one-half tablet by mouth daily         . methocarbamol (ROBAXIN) 750 MG tablet               .  montelukast (SINGULAIR) 10 MG tablet   Oral   Take 10 mg by mouth.         . mupirocin ointment (BACTROBAN) 2 %      Apply to affected area 3 times daily   22 g   0   . sulfamethoxazole-trimethoprim (BACTRIM DS) 800-160 MG tablet   Oral   Take 1 tablet by mouth 2 (two) times daily.   20 tablet   0   . venlafaxine XR (EFFEXOR-XR) 150 MG 24 hr capsule   Oral   Take 150 mg by mouth.           Allergies Lac bovis; Acyclovir; Cefdinir; Codeine; and Other  No family history on file.  Social History Social History  Substance Use Topics  . Smoking status: Former Smoker -- 1.00 packs/day for 35 years    Types: Cigarettes    Quit date: 01/27/2015  . Smokeless tobacco: Never Used  . Alcohol Use: Yes    Review of Systems Constitutional: No fever/chills Cardiovascular: Denies chest pain. Respiratory: Denies shortness of breath. Gastrointestinal:   No nausea, no vomiting.  Musculoskeletal: Negative for back pain.  Skin: Negative for rash. Infection right great toe. Neurological: Negative for headaches, focal weakness or numbness.  10-point ROS otherwise negative.  ____________________________________________   PHYSICAL EXAM:  VITAL SIGNS: ED Triage Vitals  Enc Vitals Group     BP 04/17/15 0718 122/81 mmHg     Pulse Rate 04/17/15  0718 97     Resp 04/17/15 0718 18     Temp 04/17/15 0718 97.7 F (36.5 C)     Temp Source 04/17/15 0718 Oral     SpO2 04/17/15 0718 98 %     Weight 04/17/15 0718 210 lb (95.255 kg)     Height --      Head Cir --      Peak Flow --      Pain Score 04/17/15 0718 0     Pain Loc --      Pain Edu? --      Excl. in GC? --     Constitutional: Alert and oriented. Well appearing and in no acute distress. Eyes: Conjunctivae are normal. PERRL. EOMI. Head: Atraumatic. Nose: No congestion/rhinnorhea. Neck: No stridor.   Cardiovascular: Normal rate, regular rhythm. Grossly normal heart sounds.  Good peripheral circulation. Respiratory: Normal respiratory effort.  No retractions. Lungs CTAB. Gastrointestinal: Soft and nontender. No distention.  Musculoskeletal: No lower extremity tenderness nor edema.  No joint effusions. Neurologic:  Normal speech and language. No gross focal neurologic deficits are appreciated. No gait instability. Skin:  Skin is warm. There is moderate amount of callus formation at the ball of the patient's right foot. Skin is peeling in the area. There is no active drainage at this point. There is no extending cellulitis. There is some tenderness on palpation. Psychiatric: Mood and affect are normal. Speech and behavior are normal.  ____________________________________________   LABS (all labs ordered are listed, but only abnormal results are displayed)  Labs Reviewed  GLUCOSE, CAPILLARY - Abnormal; Notable for the following:    Glucose-Capillary 129 (*)    All other components within normal limits   _PROCEDURES  Procedure(s) performed: None  Critical Care performed: No  ____________________________________________   INITIAL IMPRESSION / ASSESSMENT AND PLAN / ED COURSE  Pertinent labs & imaging results that were available during my care of the patient were reviewed by me and considered in my medical decision making (see chart for details).  Patient is  afebrile while in the emergency room. She states that she has in the past been to a wound care center for similar situations. She is given a referral to the wound care center here at [facility]. We will change her antibiotics to clindamycin. She'll call the wound care center today to get an appointment. She will return to the emergency room if any severe worsening urgent concerns. ____________________________________________   FINAL CLINICAL IMPRESSION(S) / ED DIAGNOSES  Final diagnoses:  Foot infection      Tommi Rumps, PA-C 04/17/15 1505  [name], MD 04/17/15 [phone]

## 2015-04-17 NOTE — Discharge Instructions (Signed)
Cellulitis Cellulitis is an infection of the skin and the tissue under the skin. The infected area is usually red and tender. This happens most often in the arms and lower legs. HOME CARE   Take your antibiotic medicine as told. Finish the medicine even if you start to feel better.  Keep the infected arm or leg raised (elevated).  Put a warm cloth on the area up to 4 times per day.  Only take medicines as told by your doctor.  Keep all doctor visits as told. GET HELP IF:  You see red streaks on the skin coming from the infected area.  Your red area gets bigger or turns a dark color.  Your bone or joint under the infected area is painful after the skin heals.  Your infection comes back in the same area or different area.  You have a puffy (swollen) bump in the infected area.  You have new symptoms.  You have a fever. GET HELP RIGHT AWAY IF:   You feel very sleepy.  You throw up (vomit) or have watery poop (diarrhea).  You feel sick and have muscle aches and pains.   This information is not intended to replace advice given to you by your health care provider. Make sure you discuss any questions you have with your health care provider.   Document Released: 10/23/2007 Document Revised: 01/25/2015 Document Reviewed: 07/22/2011 Elsevier Interactive Patient Education 2016 [employer].    Call and make an appointment with podiatry department at [facility]. Continue taking Septra DS twice a day until finished. Begin taking clindamycin 2 capsules 4 times a day until finished. Return to the emergency room if any severe worsening or fever, chills or vomiting.

## 2015-04-17 NOTE — ED Notes (Signed)
Pt reports she was told to come back to er to have her RT great toe rechecked from wound. Pt reports she was here Tuesday and given abx.

## 2015-04-20 ENCOUNTER — Encounter: Payer: BLUE CROSS/BLUE SHIELD | Attending: Cardiology

## 2015-04-20 DIAGNOSIS — I252 Old myocardial infarction: Secondary | ICD-10-CM | POA: Insufficient documentation

## 2015-04-25 NOTE — Progress Notes (Signed)
Cardiac Individual Treatment Plan  Patient Details  Name: Caitlyn Roth MRN: [id number] Date of Birth: [birth year] Referring Provider:  Dalia Heading, MD  Initial Encounter Date:    Visit Diagnosis: Status post myocardial infarction  Patient's Home Medications on Admission:  Current outpatient prescriptions:  .  amphetamine-dextroamphetamine (ADDERALL XR) 30 MG 24 hr capsule, Take 30 mg by mouth., Disp: , Rfl:  .  atenolol (TENORMIN) 50 MG tablet, Take 100 mg by mouth., Disp: , Rfl:  .  atorvastatin (LIPITOR) 20 MG tablet, Take 20 mg by mouth., Disp: , Rfl:  .  Calcium Carb-Ergocalciferol 500-200 MG-UNIT TABS, Take by mouth., Disp: , Rfl:  .  chlorpheniramine-HYDROcodone (TUSSIONEX PENNKINETIC ER) 10-8 MG/5ML SUER, Take 5 mLs by mouth 2 (two) times daily., Disp: 140 mL, Rfl: 0 .  clindamycin (CLEOCIN) 150 MG capsule, Take 2 capsules (300 mg total) by mouth 4 (four) times daily., Disp: 56 capsule, Rfl: 0 .  clopidogrel (PLAVIX) 75 MG tablet, Take by mouth., Disp: , Rfl:  .  cyclobenzaprine (FLEXERIL) 10 MG tablet, , Disp: , Rfl:  .  desloratadine (CLARINEX) 5 MG tablet, Take 5 mg by mouth., Disp: , Rfl:  .  esomeprazole (NEXIUM) 40 MG capsule, Take 40 mg by mouth., Disp: , Rfl:  .  ferrous sulfate 325 (65 FE) MG tablet, Take 325 mg by mouth., Disp: , Rfl:  .  Liraglutide 18 MG/3ML SOPN, Inject 1.8 mg into the skin., Disp: , Rfl:  .  meloxicam (MOBIC) 7.5 MG tablet, , Disp: , Rfl:  .  methimazole (TAPAZOLE) 5 MG tablet, Take one-half tablet by mouth daily, Disp: , Rfl:  .  methocarbamol (ROBAXIN) 750 MG tablet, , Disp: , Rfl:  .  montelukast (SINGULAIR) 10 MG tablet, Take 10 mg by mouth., Disp: , Rfl:  .  mupirocin ointment (BACTROBAN) 2 %, Apply to affected area 3 times daily, Disp: 22 g, Rfl: 0 .  sulfamethoxazole-trimethoprim (BACTRIM DS) 800-160 MG tablet, Take 1 tablet by mouth 2 (two) times daily., Disp: 20 tablet, Rfl: 0 .  venlafaxine XR (EFFEXOR-XR) 150 MG 24 hr capsule, Take  150 mg by mouth., Disp: , Rfl:   Past Medical History: Past Medical History  Diagnosis Date  . Hypertension   . Asthma   . Hyperlipemia   . Graves disease     Tobacco Use: History  Smoking status  . Former Smoker -- 1.00 packs/day for 35 years  . Types: Cigarettes  . Quit date: 01/27/2015  Smokeless tobacco  . Never Used    Labs: Recent Review Flowsheet Data    There is no flowsheet data to display.       Exercise Target Goals:    Exercise Program Goal: Individual exercise prescription set with THRR, safety & activity barriers. Participant demonstrates ability to understand and report RPE using BORG scale, to self-measure pulse accurately, and to acknowledge the importance of the exercise prescription.  Exercise Prescription Goal: Starting with aerobic activity 30 plus minutes a day, 3 days per week for initial exercise prescription. Provide home exercise prescription and guidelines that participant acknowledges understanding prior to discharge.  Activity Barriers & Risk Stratification:     Activity Barriers & Risk Stratification - 03/01/15 1024    Activity Barriers & Risk Stratification   Risk Stratification High      6 Minute Walk:     6 Minute Walk      02/28/15 0957       6 Minute Walk   Phase Initial  Distance 1570 feet     Walk Time 6 minutes     Resting HR 75 bpm     Resting BP 124/80 mmHg     Max Ex. HR 110 bpm     Max Ex. BP 126/74 mmHg     RPE 11     Symptoms No        Initial Exercise Prescription:     Initial Exercise Prescription - 02/28/15 1000    Date of Initial Exercise Prescription   Date 02/28/15   Treadmill   MPH 2.8   Grade 0   Minutes 10   Bike   Level 0.4   Minutes 10   Recumbant Bike   Level 3   RPM 40   Watts 25   Minutes 15   NuStep   Level 2   Watts 20   Minutes 15   Arm Ergometer   Level 1   Watts 10   Minutes 10   Arm/Foot Ergometer   Level 4   Watts 12   Minutes 10   Cybex   Level 2   RPM  50   Minutes 15   Recumbant Elliptical   Level 1   RPM 40   Watts 10   Minutes 15   REL-XR   Level 2   Watts 25   Minutes 15   Prescription Details   Frequency (times per week) 3   Duration Progress to 30 minutes of continuous aerobic without signs/symptoms of physical distress   Intensity   THRR REST +  30   Ratings of Perceived Exertion 11-15   Progression Continue progressive overload as per policy without signs/symptoms or physical distress.   Resistance Training   Training Prescription Yes   Weight 2   Reps 10-15      Exercise Prescription Changes:     Exercise Prescription Changes      03/15/15 1700 03/20/15 1200 04/03/15 1600 04/05/15 0741     Exercise Review   Progression Yes Yes Yes Yes    Response to Exercise   Blood Pressure (Admit)  122/84 mmHg 122/84 mmHg 122/80 mmHg    Blood Pressure (Exercise)  158/84 mmHg 158/84 mmHg 144/76 mmHg    Blood Pressure (Exit)  118/82 mmHg 118/82 mmHg 136/80 mmHg    Heart Rate (Admit)  90 bpm 90 bpm 103 bpm    Heart Rate (Exercise)  114 bpm 114 bpm 114 bpm    Heart Rate (Exit)  95 bpm 95 bpm 113 bpm    Rating of Perceived Exertion (Exercise)  9 9 12     Symptoms  None None None    Comments  Reviewed individualized exercise prescription and made increases per departmental policy. Exercise increases were discussed with the patient and they were able to perform the new work loads without issue (no signs or symptoms).  Reviewed individualized exercise prescription and made increases per departmental policy. Exercise increases were discussed with the patient and they were able to perform the new work loads without issue (no signs or symptoms).  Discussed adding one day a week of exercise at home in addition to the three days a week at [facility]. Explained that 150 minutes a week of exercise is the goal for combating and managing chronic health conditions. This volume of exercise is also proven to give other health benefits. Discussed  interval training with [name]. She will incorporate this into her treadmill walking.     Duration  Progress to 30 minutes of continuous  aerobic without signs/symptoms of physical distress Progress to 30 minutes of continuous aerobic without signs/symptoms of physical distress Progress to 50 minutes of aerobic without signs/symptoms of physical distress    Intensity  Rest + 30 Rest + 30 Rest + 30    Progression  Continue progressive overload as per policy without signs/symptoms or physical distress. Continue progressive overload as per policy without signs/symptoms or physical distress. Continue progressive overload as per policy without signs/symptoms or physical distress.    Resistance Training   Training Prescription  Yes Yes Yes    Weight  5 5 5     Reps  10-15 10-15 10-15    Interval Training   Interval Training  No No No    Treadmill   MPH 3.5 3.5 3.5 3.5    Grade 0 0 2 2    Minutes 15 20 20 20     NuStep   Level  5 5 5     Watts  45 45 45    Minutes  20 20 20     REL-XR   Level 5 5 7 7     Watts 80 80 80 80    Minutes 15 15 15 15        Discharge Exercise Prescription (Final Exercise Prescription Changes):     Exercise Prescription Changes - 04/05/15 0741    Exercise Review   Progression Yes   Response to Exercise   Blood Pressure (Admit) 122/80 mmHg   Blood Pressure (Exercise) 144/76 mmHg   Blood Pressure (Exit) 136/80 mmHg   Heart Rate (Admit) 103 bpm   Heart Rate (Exercise) 114 bpm   Heart Rate (Exit) 113 bpm   Rating of Perceived Exertion (Exercise) 12   Symptoms None   Comments Discussed adding one day a week of exercise at home in addition to the three days a week at [facility]. Explained that 150 minutes a week of exercise is the goal for combating and managing chronic health conditions. This volume of exercise is also proven to give other health benefits. Discussed interval training with [name]. She will incorporate this into her treadmill walking.    Duration Progress  to 50 minutes of aerobic without signs/symptoms of physical distress   Intensity Rest + 30   Progression Continue progressive overload as per policy without signs/symptoms or physical distress.   Resistance Training   Training Prescription Yes   Weight 5   Reps 10-15   Interval Training   Interval Training No   Treadmill   MPH 3.5   Grade 2   Minutes 20   NuStep   Level 5   Watts 45   Minutes 20   REL-XR   Level 7   Watts 80   Minutes 15      Nutrition:  Target Goals: Understanding of nutrition guidelines, daily intake of sodium 1500mg , cholesterol 200mg , calories 30% from fat and 7% or less from saturated fats, daily to have 5 or more servings of fruits and vegetables.  Biometrics:     Pre Biometrics - 02/28/15 0956    Pre Biometrics   Height 5\' 10"  (1.778 m)   Weight 209 lb 11.2 oz (95.119 kg)   Waist Circumference 39 inches   Hip Circumference 43.5 inches   Waist to Hip Ratio 0.9 %   BMI (Calculated) 30.2       Nutrition Therapy Plan and Nutrition Goals:     Nutrition Therapy & Goals - 02/28/15 1626    Nutrition Therapy   Drug/Food Interactions Statins/Certain  Fruits   Intervention Plan   Intervention Using nutrition plan and personal goals to gain a healthy nutrition lifestyle. Add exercise as prescribed.      Nutrition Discharge: Rate Your Plate Scores:   Nutrition Goals Re-Evaluation:   Psychosocial: Target Goals: Acknowledge presence or absence of depression, maximize coping skills, provide positive support system. Participant is able to verbalize types and ability to use techniques and skills needed for reducing stress and depression.  Initial Review & Psychosocial Screening:     Initial Psych Review & Screening - 03/01/15 1028    Initial Review   Current issues with Current Stress Concerns  Chen and her family have had multiple heatlh problems. Son [name] heart surgery as a infant, Husband is waiting for a LUng transplant. He was  in [facility]   Family Dynamics   Good Support System? Yes   Barriers   Psychosocial barriers to participate in program The patient should benefit from training in stress management and relaxation.   Screening Interventions   Interventions Encouraged to exercise;Program counselor consult      Quality of Life Scores:     Quality of Life - 03/01/15 1508    Quality of Life Scores   Health/Function Pre 21.37 %   Socioeconomic Pre 29.14 %   Psych/Spiritual Pre 23.14 %   Family Pre 25.2 %   GLOBAL Pre 23.9 %      PHQ-9:     Recent Review Flowsheet Data    Depression screen [facility] 2/9 02/28/2015   Decreased Interest 1   Down, Depressed, Hopeless 0   PHQ - 2 Score 1   Altered sleeping 2   Tired, decreased energy 2   Change in appetite 1   Feeling bad or failure about yourself  0   Trouble concentrating 0   Moving slowly or fidgety/restless 0   Suicidal thoughts 0   PHQ-9 Score 6   Difficult doing work/chores Not difficult at all      Psychosocial Evaluation and Intervention:     Psychosocial Evaluation - 03/01/15 1730    Psychosocial Evaluation & Interventions   Comments Counselor met with Ms. Smoker today for initial psychosocial evaluation.   She is a 49 year old female who had a heart attack and stent insertion about a month ago.  She has a spouse of 15 years  and lives near his family (who are not considered very supportive)  Ms. Dudzinski has multiple health issues with Graves Disease, and ADHD which is treated with medication.  She admits that she does not sleep well and is need of a sleep study but "tired of doctors currently  Her appetite is good.  She admits to a history of anxiety but denies depressive symptoms in the past or currently.  She takes medication for anxiety as needed.  Ms. Beehler reports she has multiple stressors in her life at this time with spouse's brother living in her home for the past 3 years, and conflict with him as well as spouses's family in  general.  Also, Ms. Hagenow reports her spouse  has been sick the past year and had an episode recently that was concerning.  Counselor discussed stress management techniques with Ms. Ferrero during this time.  Ms. Defino has goals to exercise with greater ease in the future, increase her stamina and strength and hopefully lose some weight.  Counselor encouraged Ms. Yanko to consider asking her doctor for a sleep study in order to promote overall health  and wellness and expedite recovery from her cardiac procedure.       Psychosocial Re-Evaluation:   Vocational Rehabilitation: Provide vocational rehab assistance to qualifying candidates.   Vocational Rehab Evaluation & Intervention:     Vocational Rehab - 03/01/15 1024    Initial Vocational Rehab Evaluation & Intervention   Assessment shows need for Vocational Rehabilitation No      Education: Education Goals: Education classes will be provided on a weekly basis, covering required topics. Participant will state understanding/return demonstration of topics presented.  Learning Barriers/Preferences:     Learning Barriers/Preferences - 03/01/15 1024    Learning Barriers/Preferences   Learning Barriers None   Learning Preferences None      Education Topics: General Nutrition Guidelines/Fats and Fiber: -Group instruction provided by verbal, written material, models and posters to present the general guidelines for heart healthy nutrition. Gives an explanation and review of dietary fats and fiber.   Controlling Sodium/Reading Food Labels: -Group verbal and written material supporting the discussion of sodium use in heart healthy nutrition. Review and explanation with models, verbal and written materials for utilization of the food label.   Exercise Physiology & Risk Factors: - Group verbal and written instruction with models to review the exercise physiology of the cardiovascular system and associated critical values. Details  cardiovascular disease risk factors and the goals associated with each risk factor.   Aerobic Exercise & Resistance Training: - Gives group verbal and written discussion on the health impact of inactivity. On the components of aerobic and resistive training programs and the benefits of this training and how to safely progress through these programs.   Flexibility, Balance, General Exercise Guidelines: - Provides group verbal and written instruction on the benefits of flexibility and balance training programs. Provides general exercise guidelines with specific guidelines to those with heart or lung disease. Demonstration and skill practice provided.          Cardiac Rehab from 04/03/2015 in [facility] Cardiac Rehab   Date  04/03/15   Educator  SW   Instruction Review Code  2- meets goals/outcomes      Stress Management: - Provides group verbal and written instruction about the health risks of elevated stress, cause of high stress, and healthy ways to reduce stress.   Depression: - Provides group verbal and written instruction on the correlation between heart/lung disease and depressed mood, treatment options, and the stigmas associated with seeking treatment.      Cardiac Rehab from 04/03/2015 in [facility] Cardiac Rehab   Date  03/15/15   Educator  Jeannetta Ellis   Instruction Review Code  2- meets goals/outcomes      Anatomy & Physiology of the Heart: - Group verbal and written instruction and models provide basic cardiac anatomy and physiology, with the coronary electrical and arterial systems. Review of: AMI, Angina, Valve disease, Heart Failure, Cardiac Arrhythmia, Pacemakers, and the ICD.   Cardiac Procedures: - Group verbal and written instruction and models to describe the testing methods done to diagnose heart disease. Reviews the outcomes of the test results. Describes the treatment choices: Medical Management, Angioplasty, or Coronary Bypass Surgery.      Cardiac Rehab from  04/03/2015 in [facility] Cardiac Rehab   Date  03/13/15   Educator  SB   Instruction Review Code  2- meets goals/outcomes      Cardiac Medications: - Group verbal and written instruction to review commonly prescribed medications for heart disease. Reviews the medication, class of the drug, and side effects. Includes  the steps to properly store meds and maintain the prescription regimen.      Cardiac Rehab from 04/03/2015 in [facility] Cardiac Rehab   Date  03/20/15   Educator  SB   Instruction Review Code  2- meets goals/outcomes      Go Sex-Intimacy & Heart Disease, Get SMART - Goal Setting: - Group verbal and written instruction through game format to discuss heart disease and the return to sexual intimacy. Provides group verbal and written material to discuss and apply goal setting through the application of the S.M.A.R.T. Method.      Cardiac Rehab from 04/03/2015 in [facility] Cardiac Rehab   Date  03/13/15   Educator  SB   Instruction Review Code  2- meets goals/outcomes      Other Matters of the Heart: - Provides group verbal, written materials and models to describe Heart Failure, Angina, Valve Disease, and Diabetes in the realm of heart disease. Includes description of the disease process and treatment options available to the cardiac patient.   Exercise & Equipment Safety: - Individual verbal instruction and demonstration of equipment use and safety with use of the equipment.      Cardiac Rehab from 04/03/2015 in [facility] Cardiac Rehab   Date  03/01/15   Educator  C. Enterkin,RN   Instruction Review Code  1- partially meets, needs review/practice      Infection Prevention: - Provides verbal and written material to individual with discussion of infection control including proper hand washing and proper equipment cleaning during exercise session.      Cardiac Rehab from 04/03/2015 in [facility] Cardiac Rehab   Date  03/01/15   Educator  C. Enterkin,RN   Instruction Review Code  2- meets  goals/outcomes      Falls Prevention: - Provides verbal and written material to individual with discussion of falls prevention and safety.      Cardiac Rehab from 04/03/2015 in [facility] Cardiac Rehab   Date  03/01/15   Educator  C. ENterkinRN   Instruction Review Code  2- meets goals/outcomes      Diabetes: - Individual verbal and written instruction to review signs/symptoms of diabetes, desired ranges of glucose level fasting, after meals and with exercise. Advice that pre and post exercise glucose checks will be done for 3 sessions at entry of program.      Cardiac Rehab from 04/03/2015 in [facility] Cardiac Rehab   Date  03/01/15   Educator  C. Enterkin, RN   Instruction Review Code  1- partially meets, needs review/practice       Knowledge Questionnaire Score:     Knowledge Questionnaire Score - 03/01/15 1024    Knowledge Questionnaire Score   Pre Score 25      Personal Goals and Risk Factors at Admission:     Personal Goals and Risk Factors at Admission - 02/28/15 1527    Personal Goals and Risk Factors on Admission   Increase Aerobic Exercise and Physical Activity Yes   Intervention While in program, learn and follow the exercise prescription taught. Start at a low level workload and increase workload after able to maintain previous level for 30 minutes. Increase time before increasing intensity.   Quit Smoking Yes  Quit 01/27/2015   Intervention Utilize your health care professional team to help with smoking cessation while in the program. Your doctor can prescribe medications to aid in cessation. The program can provide information and counseling as needed.   Diabetes Yes   Goal Blood glucose control identified  by blood glucose values, HgbA1C. Participant verbalizes understanding of the signs/symptoms of hyper/hypo glycemia, proper foot care and importance of medication and nutrition plan for blood glucose control.   Intervention Provide nutrition & aerobic exercise along with  prescribed medications to achieve blood glucose in normal ranges: Fasting 65-99 mg/dL   Hypertension Yes   Goal Participant will see blood pressure controlled within the values of 140/57mm/Hg or within value directed by their physician.   Intervention Provide nutrition & aerobic exercise along with prescribed medications to achieve BP 140/90 or less.   Lipids Yes   Goal Cholesterol controlled with medications as prescribed, with individualized exercise RX and with personalized nutrition plan. Value goals: LDL < 70mg , HDL > 40mg . Participant states understanding of desired cholesterol values and following prescriptions.   Intervention Provide nutrition & aerobic exercise along with prescribed medications to achieve LDL 70mg , HDL >40mg .   Stress Yes   Goal To meet with psychosocial counselor for stress and relaxation information and guidance. To state understanding of performing relaxation techniques and or identifying personal stressors.   Intervention Provide education on types of stress, identifiying stressors, and ways to cope with stress. Provide demonstration and active practice of relaxation techniques.  Fleda recently quit smoking.      Personal Goals and Risk Factors Review:      Goals and Risk Factor Review      04/05/15 1810           Increase Aerobic Exercise and Physical Activity   Goals Progress/Improvement seen  Yes       Comments Patient has completed 16/36 sessions.  Crysta states exercise is going well.  She has not noticed any specific changes as a result of exercising on a regual basis in Cardiac Rehab.         Diabetes   Goal Blood glucose control identified by blood glucose values, HgbA1C. Participant verbalizes understanding of the signs/symptoms of hyper/hypo glycemia, proper foot care and importance of medication and nutrition plan for blood glucose control.  Blood sugar is well controlled by diet and exercise per patient.  Last FSBS check in Cardiac Rehab was on  October 24th with pre-exercise value of 127 and  117 post exercise.         Hypertension   Goal Participant will see blood pressure controlled within the values of 140/51mm/Hg or within value directed by their physician.  BP well controlled today with initial reading of 122/80 and post relaxation phase BP of 136/80.            Personal Goals Discharge (Final Personal Goals and Risk Factors Review):      Goals and Risk Factor Review - 04/05/15 1810    Increase Aerobic Exercise and Physical Activity   Goals Progress/Improvement seen  Yes   Comments Patient has completed 16/36 sessions.  Uriel states exercise is going well.  She has not noticed any specific changes as a result of exercising on a regual basis in Cardiac Rehab.     Diabetes   Goal Blood glucose control identified by blood glucose values, HgbA1C. Participant verbalizes understanding of the signs/symptoms of hyper/hypo glycemia, proper foot care and importance of medication and nutrition plan for blood glucose control.  Blood sugar is well controlled by diet and exercise per patient.  Last FSBS check in Cardiac Rehab was on October 24th with pre-exercise value of 127 and  117 post exercise.     Hypertension   Goal Participant will see blood pressure controlled  within the values of 140/49mm/Hg or within value directed by their physician.  BP well controlled today with initial reading of 122/80 and post relaxation phase BP of 136/80.        ITP Comments:     ITP Comments      04/25/15 1033           ITP Comments 30 day review  Continue with ITP  has been out sick since 11/16 visit          Comments:

## 2015-04-26 NOTE — Addendum Note (Signed)
Addended by: Rudy Jew on: 04/26/2015 11:13 AM   Modules accepted: Orders

## 2015-04-27 ENCOUNTER — Encounter: Payer: Self-pay | Admitting: *Deleted

## 2015-04-27 DIAGNOSIS — I252 Old myocardial infarction: Secondary | ICD-10-CM

## 2015-04-27 NOTE — Progress Notes (Signed)
Patient arrived to cardiac rehab at the end of the session today.  Patient has not been to cardiac rehab since April 05, 2015.  Patient stated she has been unable to come to rehab due to pressure ulcer on her right great toe and plantar wart on right great toe.  Pressure ulcer on top of right great toe has healed.  Now wearing a special sandal/boot/shoe and is unable to put complete pressure on her right foot.  MD told patient she could return to cardiac rehab but is restricted to  50% weight bearing on that foot.  Patient had previously been on the treadmill.  Now patient will need to use a seated piece of equipment.   Patient also reported that she does not want to attend any educational sessions going forward, as her ADHD meds are wearing off by the time she comes to cardiac rehab in the afternoon.  Patient wants to exercise only.  Calendars with the appropriate times to attend each session highlighted for the months of December and January.

## 2015-05-01 ENCOUNTER — Encounter: Payer: BLUE CROSS/BLUE SHIELD | Admitting: *Deleted

## 2015-05-01 DIAGNOSIS — I252 Old myocardial infarction: Secondary | ICD-10-CM

## 2015-05-01 NOTE — Progress Notes (Signed)
Cardiac Individual Treatment Plan  Patient Details  Name: GENNESIS CIOTTI MRN: [id number] Date of Birth: 04-19-1966 Referring Provider:  Alden Benjamin Catherin*  Initial Encounter Date:    Visit Diagnosis: Status post myocardial infarction  Patient's Home Medications on Admission:  Current outpatient prescriptions:  .  amphetamine-dextroamphetamine (ADDERALL XR) 30 MG 24 hr capsule, Take 30 mg by mouth., Disp: , Rfl:  .  atenolol (TENORMIN) 50 MG tablet, Take 100 mg by mouth., Disp: , Rfl:  .  atorvastatin (LIPITOR) 20 MG tablet, Take 20 mg by mouth., Disp: , Rfl:  .  Calcium Carb-Ergocalciferol 500-200 MG-UNIT TABS, Take by mouth., Disp: , Rfl:  .  chlorpheniramine-HYDROcodone (TUSSIONEX PENNKINETIC ER) 10-8 MG/5ML SUER, Take 5 mLs by mouth 2 (two) times daily., Disp: 140 mL, Rfl: 0 .  clindamycin (CLEOCIN) 150 MG capsule, Take 2 capsules (300 mg total) by mouth 4 (four) times daily., Disp: 56 capsule, Rfl: 0 .  clopidogrel (PLAVIX) 75 MG tablet, Take by mouth., Disp: , Rfl:  .  cyclobenzaprine (FLEXERIL) 10 MG tablet, , Disp: , Rfl:  .  desloratadine (CLARINEX) 5 MG tablet, Take 5 mg by mouth., Disp: , Rfl:  .  esomeprazole (NEXIUM) 40 MG capsule, Take 40 mg by mouth., Disp: , Rfl:  .  ferrous sulfate 325 (65 FE) MG tablet, Take 325 mg by mouth., Disp: , Rfl:  .  Liraglutide 18 MG/3ML SOPN, Inject 1.8 mg into the skin., Disp: , Rfl:  .  meloxicam (MOBIC) 7.5 MG tablet, , Disp: , Rfl:  .  methimazole (TAPAZOLE) 5 MG tablet, Take one-half tablet by mouth daily, Disp: , Rfl:  .  methocarbamol (ROBAXIN) 750 MG tablet, , Disp: , Rfl:  .  montelukast (SINGULAIR) 10 MG tablet, Take 10 mg by mouth., Disp: , Rfl:  .  mupirocin ointment (BACTROBAN) 2 %, Apply to affected area 3 times daily, Disp: 22 g, Rfl: 0 .  sulfamethoxazole-trimethoprim (BACTRIM DS) 800-160 MG tablet, Take 1 tablet by mouth 2 (two) times daily., Disp: 20 tablet, Rfl: 0 .  venlafaxine XR (EFFEXOR-XR) 150 MG 24 hr capsule,  Take 150 mg by mouth., Disp: , Rfl:   Past Medical History: Past Medical History  Diagnosis Date  . Hypertension   . Asthma   . Hyperlipemia   . Graves disease     Tobacco Use: History  Smoking status  . Former Smoker -- 1.00 packs/day for 35 years  . Types: Cigarettes  . Quit date: 01/27/2015  Smokeless tobacco  . Never Used    Labs: Recent Review Flowsheet Data    There is no flowsheet data to display.       Exercise Target Goals:    Exercise Program Goal: Individual exercise prescription set with THRR, safety & activity barriers. Participant demonstrates ability to understand and report RPE using BORG scale, to self-measure pulse accurately, and to acknowledge the importance of the exercise prescription.  Exercise Prescription Goal: Starting with aerobic activity 30 plus minutes a day, 3 days per week for initial exercise prescription. Provide home exercise prescription and guidelines that participant acknowledges understanding prior to discharge.  Activity Barriers & Risk Stratification:     Activity Barriers & Risk Stratification - 03/01/15 1024    Activity Barriers & Risk Stratification   Risk Stratification High      6 Minute Walk:     6 Minute Walk      02/28/15 0957       6 Minute Walk   Phase Initial  Distance 1570 feet     Walk Time 6 minutes     Resting HR 75 bpm     Resting BP 124/80 mmHg     Max Ex. HR 110 bpm     Max Ex. BP 126/74 mmHg     RPE 11     Symptoms No        Initial Exercise Prescription:     Initial Exercise Prescription - 02/28/15 1000    Date of Initial Exercise Prescription   Date 02/28/15   Treadmill   MPH 2.8   Grade 0   Minutes 10   Bike   Level 0.4   Minutes 10   Recumbant Bike   Level 3   RPM 40   Watts 25   Minutes 15   NuStep   Level 2   Watts 20   Minutes 15   Arm Ergometer   Level 1   Watts 10   Minutes 10   Arm/Foot Ergometer   Level 4   Watts 12   Minutes 10   Cybex   Level 2    RPM 50   Minutes 15   Recumbant Elliptical   Level 1   RPM 40   Watts 10   Minutes 15   REL-XR   Level 2   Watts 25   Minutes 15   Prescription Details   Frequency (times per week) 3   Duration Progress to 30 minutes of continuous aerobic without signs/symptoms of physical distress   Intensity   THRR REST +  30   Ratings of Perceived Exertion 11-15   Progression Continue progressive overload as per policy without signs/symptoms or physical distress.   Resistance Training   Training Prescription Yes   Weight 2   Reps 10-15      Exercise Prescription Changes:     Exercise Prescription Changes      03/15/15 1700 03/20/15 1200 04/03/15 1600 04/05/15 0741     Exercise Review   Progression Yes Yes Yes Yes    Response to Exercise   Blood Pressure (Admit)  122/84 mmHg 122/84 mmHg 122/80 mmHg    Blood Pressure (Exercise)  158/84 mmHg 158/84 mmHg 144/76 mmHg    Blood Pressure (Exit)  118/82 mmHg 118/82 mmHg 136/80 mmHg    Heart Rate (Admit)  90 bpm 90 bpm 103 bpm    Heart Rate (Exercise)  114 bpm 114 bpm 114 bpm    Heart Rate (Exit)  95 bpm 95 bpm 113 bpm    Rating of Perceived Exertion (Exercise)  9 9 12     Symptoms  None None None    Comments  Reviewed individualized exercise prescription and made increases per departmental policy. Exercise increases were discussed with the patient and they were able to perform the new work loads without issue (no signs or symptoms).  Reviewed individualized exercise prescription and made increases per departmental policy. Exercise increases were discussed with the patient and they were able to perform the new work loads without issue (no signs or symptoms).  Discussed adding one day a week of exercise at home in addition to the three days a week at [facility]. Explained that 150 minutes a week of exercise is the goal for combating and managing chronic health conditions. This volume of exercise is also proven to give other health benefits.  Discussed interval training with [name]. She will incorporate this into her treadmill walking.     Duration  Progress to 30 minutes of continuous  aerobic without signs/symptoms of physical distress Progress to 30 minutes of continuous aerobic without signs/symptoms of physical distress Progress to 50 minutes of aerobic without signs/symptoms of physical distress    Intensity  Rest + 30 Rest + 30 Rest + 30    Progression  Continue progressive overload as per policy without signs/symptoms or physical distress. Continue progressive overload as per policy without signs/symptoms or physical distress. Continue progressive overload as per policy without signs/symptoms or physical distress.    Resistance Training   Training Prescription  Yes Yes Yes    Weight  5 5 5     Reps  10-15 10-15 10-15    Interval Training   Interval Training  No No No    Treadmill   MPH 3.5 3.5 3.5 3.5    Grade 0 0 2 2    Minutes 15 20 20 20     NuStep   Level  5 5 5     Watts  45 45 45    Minutes  20 20 20     REL-XR   Level 5 5 7 7     Watts 80 80 80 80    Minutes 15 15 15 15        Discharge Exercise Prescription (Final Exercise Prescription Changes):     Exercise Prescription Changes - 04/05/15 0741    Exercise Review   Progression Yes   Response to Exercise   Blood Pressure (Admit) 122/80 mmHg   Blood Pressure (Exercise) 144/76 mmHg   Blood Pressure (Exit) 136/80 mmHg   Heart Rate (Admit) 103 bpm   Heart Rate (Exercise) 114 bpm   Heart Rate (Exit) 113 bpm   Rating of Perceived Exertion (Exercise) 12   Symptoms None   Comments Discussed adding one day a week of exercise at home in addition to the three days a week at [facility]. Explained that 150 minutes a week of exercise is the goal for combating and managing chronic health conditions. This volume of exercise is also proven to give other health benefits. Discussed interval training with [name]. She will incorporate this into her treadmill walking.    Duration  Progress to 50 minutes of aerobic without signs/symptoms of physical distress   Intensity Rest + 30   Progression Continue progressive overload as per policy without signs/symptoms or physical distress.   Resistance Training   Training Prescription Yes   Weight 5   Reps 10-15   Interval Training   Interval Training No   Treadmill   MPH 3.5   Grade 2   Minutes 20   NuStep   Level 5   Watts 45   Minutes 20   REL-XR   Level 7   Watts 80   Minutes 15      Nutrition:  Target Goals: Understanding of nutrition guidelines, daily intake of sodium 1500mg , cholesterol 200mg , calories 30% from fat and 7% or less from saturated fats, daily to have 5 or more servings of fruits and vegetables.  Biometrics:     Pre Biometrics - 02/28/15 0956    Pre Biometrics   Height 5\' 10"  (1.778 m)   Weight 209 lb 11.2 oz (95.119 kg)   Waist Circumference 39 inches   Hip Circumference 43.5 inches   Waist to Hip Ratio 0.9 %   BMI (Calculated) 30.2       Nutrition Therapy Plan and Nutrition Goals:     Nutrition Therapy & Goals - 02/28/15 1626    Nutrition Therapy   Drug/Food Interactions Statins/Certain  Fruits   Intervention Plan   Intervention Using nutrition plan and personal goals to gain a healthy nutrition lifestyle. Add exercise as prescribed.      Nutrition Discharge: Rate Your Plate Scores:   Nutrition Goals Re-Evaluation:     Nutrition Goals Re-Evaluation      05/01/15 1709           Personal Goal #1 Re-Evaluation   Personal Goal #1 Samreen reports she has lost 6 lbs since she has cut out her sodas with sugar in them due to her foot ulcer which she was seen in the Emerg. Dept.        Goal Progress Seen Yes          Psychosocial: Target Goals: Acknowledge presence or absence of depression, maximize coping skills, provide positive support system. Participant is able to verbalize types and ability to use techniques and skills needed for reducing stress and  depression.  Initial Review & Psychosocial Screening:     Initial Psych Review & Screening - 03/01/15 1028    Initial Review   Current issues with Current Stress Concerns  Sheylin and her family have had multiple heatlh problems. Son [name] heart surgery as a infant, Husband is waiting for a LUng transplant. He was in [facility]   Family Dynamics   Good Support System? Yes   Barriers   Psychosocial barriers to participate in program The patient should benefit from training in stress management and relaxation.   Screening Interventions   Interventions Encouraged to exercise;Program counselor consult      Quality of Life Scores:     Quality of Life - 03/01/15 1508    Quality of Life Scores   Health/Function Pre 21.37 %   Socioeconomic Pre 29.14 %   Psych/Spiritual Pre 23.14 %   Family Pre 25.2 %   GLOBAL Pre 23.9 %      PHQ-9:     Recent Review Flowsheet Data    Depression screen [facility] 2/9 02/28/2015   Decreased Interest 1   Down, Depressed, Hopeless 0   PHQ - 2 Score 1   Altered sleeping 2   Tired, decreased energy 2   Change in appetite 1   Feeling bad or failure about yourself  0   Trouble concentrating 0   Moving slowly or fidgety/restless 0   Suicidal thoughts 0   PHQ-9 Score 6   Difficult doing work/chores Not difficult at all      Psychosocial Evaluation and Intervention:     Psychosocial Evaluation - 03/01/15 1730    Psychosocial Evaluation & Interventions   Comments Counselor met with Ms. Bare today for initial psychosocial evaluation.   She is a 49 year old female who had a heart attack and stent insertion about a month ago.  She has a spouse of 15 years  and lives near his family (who are not considered very supportive)  Ms. Fetty has multiple health issues with Graves Disease, and ADHD which is treated with medication.  She admits that she does not sleep well and is need of a sleep study but "tired of doctors currently  Her appetite is good.   She admits to a history of anxiety but denies depressive symptoms in the past or currently.  She takes medication for anxiety as needed.  Ms. Alsbury reports she has multiple stressors in her life at this time with spouse's brother living in her home for the past 3 years, and conflict with him as well  as spouses's family in general.  Also, Ms. Uhr reports her spouse  has been sick the past year and had an episode recently that was concerning.  Counselor discussed stress management techniques with Ms. Crader during this time.  Ms. Ogando has goals to exercise with greater ease in the future, increase her stamina and strength and hopefully lose some weight.  Counselor encouraged Ms. Brinkmeier to consider asking her doctor for a sleep study in order to promote overall health and wellness and expedite recovery from her cardiac procedure.       Psychosocial Re-Evaluation:   Vocational Rehabilitation: Provide vocational rehab assistance to qualifying candidates.   Vocational Rehab Evaluation & Intervention:     Vocational Rehab - 03/01/15 1024    Initial Vocational Rehab Evaluation & Intervention   Assessment shows need for Vocational Rehabilitation No      Education: Education Goals: Education classes will be provided on a weekly basis, covering required topics. Participant will state understanding/return demonstration of topics presented.  Learning Barriers/Preferences:     Learning Barriers/Preferences - 03/01/15 1024    Learning Barriers/Preferences   Learning Barriers None   Learning Preferences None      Education Topics: General Nutrition Guidelines/Fats and Fiber: -Group instruction provided by verbal, written material, models and posters to present the general guidelines for heart healthy nutrition. Gives an explanation and review of dietary fats and fiber.   Controlling Sodium/Reading Food Labels: -Group verbal and written material supporting the discussion of sodium use in  heart healthy nutrition. Review and explanation with models, verbal and written materials for utilization of the food label.   Exercise Physiology & Risk Factors: - Group verbal and written instruction with models to review the exercise physiology of the cardiovascular system and associated critical values. Details cardiovascular disease risk factors and the goals associated with each risk factor.   Aerobic Exercise & Resistance Training: - Gives group verbal and written discussion on the health impact of inactivity. On the components of aerobic and resistive training programs and the benefits of this training and how to safely progress through these programs.   Flexibility, Balance, General Exercise Guidelines: - Provides group verbal and written instruction on the benefits of flexibility and balance training programs. Provides general exercise guidelines with specific guidelines to those with heart or lung disease. Demonstration and skill practice provided.          Cardiac Rehab from 04/03/2015 in [facility]iac [facility]   Date  04/03/15   Educator  SW   Instruction Review Code  2- meets goals/outcomes      Stress Management: - Provides group verbal and written instruction about the health risks of elevated stress, cause of high stress, and healthy ways to reduce stress.   Depression: - Provides group verbal and written instruction on the correlation between heart/lung disease and depressed mood, treatment options, and the stigmas associated with seeking treatment.      Cardiac Rehab from 04/03/2015 in [facility] Cardiac Rehab   Date  03/15/15   Educator  Jeannetta Ellis   Instruction Review Code  2- meets goals/outcomes      Anatomy & Physiology of the Heart: - Group verbal and written instruction and models provide basic cardiac anatomy and physiology, with the coronary electrical and arterial systems. Review of: AMI, Angina, Valve disease, Heart Failure, Cardiac Arrhythmia, Pacemakers,  and the ICD.   Cardiac Procedures: - Group verbal and written instruction and models to describe the testing methods done to diagnose heart disease.  Reviews the outcomes of the test results. Describes the treatment choices: Medical Management, Angioplasty, or Coronary Bypass Surgery.      Cardiac Rehab from 04/03/2015 in [facility] Cardiac Rehab   Date  03/13/15   Educator  SB   Instruction Review Code  2- meets goals/outcomes      Cardiac Medications: - Group verbal and written instruction to review commonly prescribed medications for heart disease. Reviews the medication, class of the drug, and side effects. Includes the steps to properly store meds and maintain the prescription regimen.      Cardiac Rehab from 04/03/2015 in [facility] Cardiac Rehab   Date  03/20/15   Educator  SB   Instruction Review Code  2- meets goals/outcomes      Go Sex-Intimacy & Heart Disease, Get SMART - Goal Setting: - Group verbal and written instruction through game format to discuss heart disease and the return to sexual intimacy. Provides group verbal and written material to discuss and apply goal setting through the application of the S.M.A.R.T. Method.      Cardiac Rehab from 04/03/2015 in [facility] Cardiac Rehab   Date  03/13/15   Educator  SB   Instruction Review Code  2- meets goals/outcomes      Other Matters of the Heart: - Provides group verbal, written materials and models to describe Heart Failure, Angina, Valve Disease, and Diabetes in the realm of heart disease. Includes description of the disease process and treatment options available to the cardiac patient.   Exercise & Equipment Safety: - Individual verbal instruction and demonstration of equipment use and safety with use of the equipment.      Cardiac Rehab from 04/03/2015 in [facility] Cardiac Rehab   Date  03/01/15   Educator  C. Breindel Collier,RN   Instruction Review Code  1- partially meets, needs review/practice      Infection Prevention: -  Provides verbal and written material to individual with discussion of infection control including proper hand washing and proper equipment cleaning during exercise session.      Cardiac Rehab from 04/03/2015 in [facility] Cardiac Rehab   Date  03/01/15   Educator  C. Niasia Lanphear,RN   Instruction Review Code  2- meets goals/outcomes      Falls Prevention: - Provides verbal and written material to individual with discussion of falls prevention and safety.      Cardiac Rehab from 04/03/2015 in [facility] Cardiac Rehab   Date  03/01/15   Educator  C. ENterkinRN   Instruction Review Code  2- meets goals/outcomes      Diabetes: - Individual verbal and written instruction to review signs/symptoms of diabetes, desired ranges of glucose level fasting, after meals and with exercise. Advice that pre and post exercise glucose checks will be done for 3 sessions at entry of program.      Cardiac Rehab from 04/03/2015 in [facility] Cardiac Rehab   Date  03/01/15   Educator  C. Pegeen Stiger, RN   Instruction Review Code  1- partially meets, needs review/practice       Knowledge Questionnaire Score:     Knowledge Questionnaire Score - 03/01/15 1024    Knowledge Questionnaire Score   Pre Score 25      Personal Goals and Risk Factors at Admission:     Personal Goals and Risk Factors at Admission - 02/28/15 1527    Personal Goals and Risk Factors on Admission   Increase Aerobic Exercise and Physical Activity Yes   Intervention While in program, learn and follow  the exercise prescription taught. Start at a low level workload and increase workload after able to maintain previous level for 30 minutes. Increase time before increasing intensity.   Quit Smoking Yes  Quit 01/27/2015   Intervention Utilize your health care professional team to help with smoking cessation while in the program. Your doctor can prescribe medications to aid in cessation. The program can provide information and counseling as needed.   Diabetes  Yes   Goal Blood glucose control identified by blood glucose values, HgbA1C. Participant verbalizes understanding of the signs/symptoms of hyper/hypo glycemia, proper foot care and importance of medication and nutrition plan for blood glucose control.   Intervention Provide nutrition & aerobic exercise along with prescribed medications to achieve blood glucose in normal ranges: Fasting 65-99 mg/dL   Hypertension Yes   Goal Participant will see blood pressure controlled within the values of 140/42mm/Hg or within value directed by their physician.   Intervention Provide nutrition & aerobic exercise along with prescribed medications to achieve BP 140/90 or less.   Lipids Yes   Goal Cholesterol controlled with medications as prescribed, with individualized exercise RX and with personalized nutrition plan. Value goals: LDL < 70mg , HDL > 40mg . Participant states understanding of desired cholesterol values and following prescriptions.   Intervention Provide nutrition & aerobic exercise along with prescribed medications to achieve LDL 70mg , HDL >40mg .   Stress Yes   Goal To meet with psychosocial counselor for stress and relaxation information and guidance. To state understanding of performing relaxation techniques and or identifying personal stressors.   Intervention Provide education on types of stress, identifiying stressors, and ways to cope with stress. Provide demonstration and active practice of relaxation techniques.  Natania recently quit smoking.      Personal Goals and Risk Factors Review:      Goals and Risk Factor Review      04/05/15 1810 05/01/15 1710         Increase Aerobic Exercise and Physical Activity   Goals Progress/Improvement seen  Yes Yes      Comments Patient has completed 16/36 sessions.  Avantika states exercise is going well.  She has not noticed any specific changes as a result of exercising on a regual basis in Cardiac Rehab.         Diabetes   Goal Blood glucose  control identified by blood glucose values, HgbA1C. Participant verbalizes understanding of the signs/symptoms of hyper/hypo glycemia, proper foot care and importance of medication and nutrition plan for blood glucose control.  Blood sugar is well controlled by diet and exercise per patient.  Last FSBS check in Cardiac Rehab was on October 24th with pre-exercise value of 127 and  117 post exercise.         Progress seen towards goals  Yes      Comments  Shatema reports that she has gotten her blood sugars better recently by cutting out her sodas with sugar in them. She has lost 6 lbs she reports since in the Emerg Dept for her foot ulcer.       Hypertension   Goal Participant will see blood pressure controlled within the values of 140/64mm/Hg or within value directed by their physician.  BP well controlled today with initial reading of 122/80 and post relaxation phase BP of 136/80.   Participant will see blood pressure controlled within the values of 140/25mm/Hg or within value directed by their physician.      Progress seen toward goals  Yes  Comments  Deziah's blood pressure is stable at this point.       Abnormal Lipids   Progress seen towards goals  Unknown      Comments  To follow up with his primary care MD.       Stress   Progress seen towards goals  Yes      Comments  Justina wants to only attend exercise since she feels her ADHD medicine is wearing off in the afternoon and it is stressful for her to sit through the Cardiac Rehab education portions.          Personal Goals Discharge (Final Personal Goals and Risk Factors Review):      Goals and Risk Factor Review - 05/01/15 1710    Increase Aerobic Exercise and Physical Activity   Goals Progress/Improvement seen  Yes   Diabetes   Progress seen towards goals Yes   Comments Dorinda reports that she has gotten her blood sugars better recently by cutting out her sodas with sugar in them. She has lost 6 lbs she reports since in the Emerg  Dept for her foot ulcer.    Hypertension   Goal Participant will see blood pressure controlled within the values of 140/36mm/Hg or within value directed by their physician.   Progress seen toward goals Yes   Comments Yaminah's blood pressure is stable at this point.    Abnormal Lipids   Progress seen towards goals Unknown   Comments To follow up with his primary care MD.    Stress   Progress seen towards goals Yes   Comments Espen wants to only attend exercise since she feels her ADHD medicine is wearing off in the afternoon and it is stressful for her to sit through the Cardiac Rehab education portions.       ITP Comments:     ITP Comments      04/25/15 1033           ITP Comments 30 day review  Continue with ITP  has been out sick since 11/16 visit          Comments:

## 2015-05-01 NOTE — Progress Notes (Signed)
Daily Session Note  Patient Details  Name: KIMMERLY LORA MRN: [id number] Date of Birth: [birth year] Referring Provider:  Lovell Sheehan Catherin*  Encounter Date: 05/01/2015  Check In:     Session Check In - 05/01/15 1708    Check-In   Staff Present Gerlene Burdock, RN, BSN;Steven Way, BS, ACSM EP-C, Exercise Physiologist;Susanne Bice, RN, BSN, [place]   ER physicians immediately available to respond to emergencies See telemetry face sheet for immediately available ER MD   Medication changes reported     No   Fall or balance concerns reported    No   Warm-up and Cool-down Performed on first and last piece of equipment   VAD Patient? No   Pain Assessment   Currently in Pain? No/denies         Goals Met:  Proper associated with RPD/PD & O2 Sat Exercise tolerated well  Goals Unmet:  Not Applicable  Goals Comments: Syncere is back exercising after wearing a foot boot for her sore on her foot.    Dr. [name] is Medical Director for [facility] and [facility].

## 2015-05-03 ENCOUNTER — Encounter: Payer: BLUE CROSS/BLUE SHIELD | Admitting: *Deleted

## 2015-05-03 DIAGNOSIS — I252 Old myocardial infarction: Secondary | ICD-10-CM | POA: Diagnosis not present

## 2015-05-03 NOTE — Progress Notes (Signed)
Daily Session Note  Patient Details  Name: Caitlyn Roth MRN: [id number] Date of Birth: [birth year] Referring Provider:  Lovell Sheehan Catherin*  Encounter Date: 05/03/2015  Check In:     Session Check In - 05/03/15 1741    Check-In   Staff Present Gerlene Burdock, RN, BSN;Susanne Bice, RN, BSN, CCRP;Renee Dillard [place], MS, ACSM CEP, Exercise Physiologist   ER physicians immediately available to respond to emergencies See telemetry face sheet for immediately available ER MD   Medication changes reported     No   Fall or balance concerns reported    No   Warm-up and Cool-down Performed on first and last piece of equipment   VAD Patient? No   Pain Assessment   Currently in Pain? No/denies         Goals Met:  Exercise tolerated well No report of cardiac concerns or symptoms Strength training completed today  Goals Unmet:  Not Applicable  Goals Comments:    Dr. [name] is Medical Director for [place] and [facility].

## 2015-05-10 ENCOUNTER — Encounter: Payer: BLUE CROSS/BLUE SHIELD | Admitting: *Deleted

## 2015-05-10 DIAGNOSIS — I252 Old myocardial infarction: Secondary | ICD-10-CM | POA: Diagnosis not present

## 2015-05-10 NOTE — Progress Notes (Signed)
Daily Session Note  Patient Details  Name: Caitlyn Roth MRN: [id number] Date of Birth: 1966-02-15 Referring Provider:  Teodoro Spray, MD  Encounter Date: 05/10/2015  Check In:     Session Check In - 05/10/15 1635    Check-In   Staff Present Candiss Norse, MS, ACSM CEP, Exercise Physiologist;Diane Joya Gaskins, RN, BSN;Mary Kellie Shropshire, RN   ER physicians immediately available to respond to emergencies See telemetry face sheet for immediately available ER MD   Medication changes reported     No   Fall or balance concerns reported    No   Warm-up and Cool-down Performed on first and last piece of equipment   VAD Patient? No   Pain Assessment   Currently in Pain? No/denies   Multiple Pain Sites No         Goals Met:  Independence with exercise equipment Exercise tolerated well No report of cardiac concerns or symptoms Strength training completed today  Goals Unmet:  Not Applicable  Goals Comments: Patient completed exercise prescription and all exercise goals during rehab session. The exercise was tolerated well and the patient is progressing in the program.    Dr. [name] is Medical Director for [facility] and [facility].

## 2015-05-11 ENCOUNTER — Encounter: Payer: Self-pay | Admitting: Dietician

## 2015-05-11 NOTE — Progress Notes (Signed)
Patient did not keep RD appointment scheduled for today.

## 2015-05-18 ENCOUNTER — Telehealth: Payer: Self-pay | Admitting: *Deleted

## 2015-05-18 NOTE — Telephone Encounter (Signed)
Patient was last here in Cardiac Rehab on May 10, 2015.  At that time patient stated Thursday, December 22nd, 2016 would be her last day.  Patient did not come on Thursday, December 22nd, 2016.  Reached out to patient via telephone to inquire if she plans to return to Cardiac Rehab. Left message on home phone requesting patient to call our department to let us know about her plans.

## 2015-05-21 NOTE — Progress Notes (Signed)
Cardiac Individual Treatment Plan  Patient Details  Name: Caitlyn Roth MRN: [id number] Date of Birth: [birth year] Referring Provider:  Dalia Heading, MD  Initial Encounter Date:    Visit Diagnosis: Status post myocardial infarction  Patient's Home Medications on Admission:  Current outpatient prescriptions:  .  amphetamine-dextroamphetamine (ADDERALL XR) 30 MG 24 hr capsule, Take 30 mg by mouth., Disp: , Rfl:  .  atenolol (TENORMIN) 50 MG tablet, Take 100 mg by mouth., Disp: , Rfl:  .  atorvastatin (LIPITOR) 20 MG tablet, Take 20 mg by mouth., Disp: , Rfl:  .  Calcium Carb-Ergocalciferol 500-200 MG-UNIT TABS, Take by mouth., Disp: , Rfl:  .  chlorpheniramine-HYDROcodone (TUSSIONEX PENNKINETIC ER) 10-8 MG/5ML SUER, Take 5 mLs by mouth 2 (two) times daily., Disp: 140 mL, Rfl: 0 .  clindamycin (CLEOCIN) 150 MG capsule, Take 2 capsules (300 mg total) by mouth 4 (four) times daily., Disp: 56 capsule, Rfl: 0 .  clopidogrel (PLAVIX) 75 MG tablet, Take by mouth., Disp: , Rfl:  .  cyclobenzaprine (FLEXERIL) 10 MG tablet, , Disp: , Rfl:  .  desloratadine (CLARINEX) 5 MG tablet, Take 5 mg by mouth., Disp: , Rfl:  .  esomeprazole (NEXIUM) 40 MG capsule, Take 40 mg by mouth., Disp: , Rfl:  .  ferrous sulfate 325 (65 FE) MG tablet, Take 325 mg by mouth., Disp: , Rfl:  .  Liraglutide 18 MG/3ML SOPN, Inject 1.8 mg into the skin., Disp: , Rfl:  .  meloxicam (MOBIC) 7.5 MG tablet, , Disp: , Rfl:  .  methimazole (TAPAZOLE) 5 MG tablet, Take one-half tablet by mouth daily, Disp: , Rfl:  .  methocarbamol (ROBAXIN) 750 MG tablet, , Disp: , Rfl:  .  montelukast (SINGULAIR) 10 MG tablet, Take 10 mg by mouth., Disp: , Rfl:  .  mupirocin ointment (BACTROBAN) 2 %, Apply to affected area 3 times daily, Disp: 22 g, Rfl: 0 .  sulfamethoxazole-trimethoprim (BACTRIM DS) 800-160 MG tablet, Take 1 tablet by mouth 2 (two) times daily., Disp: 20 tablet, Rfl: 0 .  venlafaxine XR (EFFEXOR-XR) 150 MG 24 hr capsule, Take  150 mg by mouth., Disp: , Rfl:   Past Medical History: Past Medical History  Diagnosis Date  . Hypertension   . Asthma   . Hyperlipemia   . Graves disease     Tobacco Use: History  Smoking status  . Former Smoker -- 1.00 packs/day for 35 years  . Types: Cigarettes  . Quit date: 01/27/2015  Smokeless tobacco  . Never Used    Labs: Recent Review Flowsheet Data    There is no flowsheet data to display.       Exercise Target Goals:    Exercise Program Goal: Individual exercise prescription set with THRR, safety & activity barriers. Participant demonstrates ability to understand and report RPE using BORG scale, to self-measure pulse accurately, and to acknowledge the importance of the exercise prescription.  Exercise Prescription Goal: Starting with aerobic activity 30 plus minutes a day, 3 days per week for initial exercise prescription. Provide home exercise prescription and guidelines that participant acknowledges understanding prior to discharge.  Activity Barriers & Risk Stratification:     Activity Barriers & Risk Stratification - 03/01/15 1024    Activity Barriers & Risk Stratification   Risk Stratification High      6 Minute Walk:     6 Minute Walk      02/28/15 0957       6 Minute Walk   Phase Initial  Distance 1570 feet     Walk Time 6 minutes     Resting HR 75 bpm     Resting BP 124/80 mmHg     Max Ex. HR 110 bpm     Max Ex. BP 126/74 mmHg     RPE 11     Symptoms No        Initial Exercise Prescription:     Initial Exercise Prescription - 02/28/15 1000    Date of Initial Exercise Prescription   Date 02/28/15   Treadmill   MPH 2.8   Grade 0   Minutes 10   Bike   Level 0.4   Minutes 10   Recumbant Bike   Level 3   RPM 40   Watts 25   Minutes 15   NuStep   Level 2   Watts 20   Minutes 15   Arm Ergometer   Level 1   Watts 10   Minutes 10   Arm/Foot Ergometer   Level 4   Watts 12   Minutes 10   Cybex   Level 2   RPM  50   Minutes 15   Recumbant Elliptical   Level 1   RPM 40   Watts 10   Minutes 15   REL-XR   Level 2   Watts 25   Minutes 15   Prescription Details   Frequency (times per week) 3   Duration Progress to 30 minutes of continuous aerobic without signs/symptoms of physical distress   Intensity   THRR REST +  30   Ratings of Perceived Exertion 11-15   Progression Continue progressive overload as per policy without signs/symptoms or physical distress.   Resistance Training   Training Prescription Yes   Weight 2   Reps 10-15      Exercise Prescription Changes:     Exercise Prescription Changes      03/15/15 1700 03/20/15 1200 04/03/15 1600 04/05/15 0741 05/10/15 1504   Exercise Review   Progression Yes Yes Yes Yes Yes   Response to Exercise   Blood Pressure (Admit)  122/84 mmHg 122/84 mmHg 122/80 mmHg 126/80 mmHg   Blood Pressure (Exercise)  158/84 mmHg 158/84 mmHg 144/76 mmHg 130/66 mmHg   Blood Pressure (Exit)  118/82 mmHg 118/82 mmHg 136/80 mmHg 108/64 mmHg   Heart Rate (Admit)  90 bpm 90 bpm 103 bpm 76 bpm   Heart Rate (Exercise)  114 bpm 114 bpm 114 bpm 122 bpm   Heart Rate (Exit)  95 bpm 95 bpm 113 bpm 97 bpm   Rating of Perceived Exertion (Exercise)  9 9 12 12    Symptoms  None None None None   Comments  Reviewed individualized exercise prescription and made increases per departmental policy. Exercise increases were discussed with the patient and they were able to perform the new work loads without issue (no signs or symptoms).  Reviewed individualized exercise prescription and made increases per departmental policy. Exercise increases were discussed with the patient and they were able to perform the new work loads without issue (no signs or symptoms).  Discussed adding one day a week of exercise at home in addition to the three days a week at [facility]. Explained that 150 minutes a week of exercise is the goal for combating and managing chronic health conditions. This volume  of exercise is also proven to give other health benefits. Discussed interval training with Caitlyn Roth. She will incorporate this into her treadmill walking.  Caitlyn Roth can continuously exercise for  the entire class and his exercise progression will now focus on intensity. We met with her and discussed interval training but after a bout of gout her workloads had to be adjusted and lowered. Caitlyn Roth can only walk on the treadmill with no incline and at a slower speed than prior to the gout. Caitlyn Roth is exercising regularly at home, we has a bike, elliptical and a treadmill. She accumulates 30 minutes of aerobic exercise on days she doesn't come to class and is very disciplined in this.  Despite lowering her TM workloads, we were able to increase her NS workloads due to increases in leg strength.    Duration  Progress to 30 minutes of continuous aerobic without signs/symptoms of physical distress Progress to 30 minutes of continuous aerobic without signs/symptoms of physical distress Progress to 50 minutes of aerobic without signs/symptoms of physical distress Progress to 50 minutes of aerobic without signs/symptoms of physical distress   Intensity  Rest + 30 Rest + 30 Rest + 30 Rest + 30   Progression  Continue progressive overload as per policy without signs/symptoms or physical distress. Continue progressive overload as per policy without signs/symptoms or physical distress. Continue progressive overload as per policy without signs/symptoms or physical distress. Continue progressive overload as per policy without signs/symptoms or physical distress.   Resistance Training   Training Prescription  Yes Yes Yes Yes   Weight  5 5 5 5    Reps  10-15 10-15 10-15 10-15   Interval Training   Interval Training  No No No No   Treadmill   MPH 3.5 3.5 3.5 3.5 2.8  Workload was adjusted after returning from gout.    Grade 0 0 2 2 0   Minutes 15 20 20 20 20    NuStep   Level  5 5 5 9    Watts  45 45 45 75   Minutes  20 20 20 20     REL-XR   Level 5 5 7 7 7    Watts 80 80 80 80 80   Minutes 15 15 15 15 15       Discharge Exercise Prescription (Final Exercise Prescription Changes):     Exercise Prescription Changes - 05/10/15 1504    Exercise Review   Progression Yes   Response to Exercise   Blood Pressure (Admit) 126/80 mmHg   Blood Pressure (Exercise) 130/66 mmHg   Blood Pressure (Exit) 108/64 mmHg   Heart Rate (Admit) 76 bpm   Heart Rate (Exercise) 122 bpm   Heart Rate (Exit) 97 bpm   Rating of Perceived Exertion (Exercise) 12   Symptoms None   Comments Caitlyn Roth can continuously exercise for the entire class and his exercise progression will now focus on intensity. We met with her and discussed interval training but after a bout of gout her workloads had to be adjusted and lowered. Caitlyn Roth can only walk on the treadmill with no incline and at a slower speed than prior to the gout. Caitlyn Roth is exercising regularly at home, we has a bike, elliptical and a treadmill. She accumulates 30 minutes of aerobic exercise on days she doesn't come to class and is very disciplined in this.  Despite lowering her TM workloads, we were able to increase her NS workloads due to increases in leg strength.    Duration Progress to 50 minutes of aerobic without signs/symptoms of physical distress   Intensity Rest + 30   Progression Continue progressive overload as per policy without signs/symptoms or physical distress.   Resistance  Training   Training Prescription Yes   Weight 5   Reps 10-15   Interval Training   Interval Training No   Treadmill   MPH 2.8  Workload was adjusted after returning from gout.    Grade 0   Minutes 20   NuStep   Level 9   Watts 75   Minutes 20   REL-XR   Level 7   Watts 80   Minutes 15      Nutrition:  Target Goals: Understanding of nutrition guidelines, daily intake of sodium 1500mg , cholesterol 200mg , calories 30% from fat and 7% or less from saturated fats, daily to have 5 or more servings of  fruits and vegetables.  Biometrics:     Pre Biometrics - 02/28/15 0956    Pre Biometrics   Height 5\' 10"  (1.778 m)   Weight 209 lb 11.2 oz (95.119 kg)   Waist Circumference 39 inches   Hip Circumference 43.5 inches   Waist to Hip Ratio 0.9 %   BMI (Calculated) 30.2       Nutrition Therapy Plan and Nutrition Goals:     Nutrition Therapy & Goals - 02/28/15 1626    Nutrition Therapy   Drug/Food Interactions Statins/Certain Fruits   Intervention Plan   Intervention Using nutrition plan and personal goals to gain a healthy nutrition lifestyle. Add exercise as prescribed.      Nutrition Discharge: Rate Your Plate Scores:   Nutrition Goals Re-Evaluation:     Nutrition Goals Re-Evaluation      05/01/15 1709           Personal Goal #1 Re-Evaluation   Personal Goal #1 Caitlyn Roth reports she has lost 6 lbs since she has cut out her sodas with sugar in them due to her foot ulcer which she was seen in the Emerg. Dept.        Goal Progress Seen Yes          Psychosocial: Target Goals: Acknowledge presence or absence of depression, maximize coping skills, provide positive support system. Participant is able to verbalize types and ability to use techniques and skills needed for reducing stress and depression.  Initial Review & Psychosocial Screening:     Initial Psych Review & Screening - 03/01/15 1028    Initial Review   Current issues with Current Stress Concerns  Caitlyn Roth and her family have had multiple heatlh problems. Son Caitlyn Roth heart surgery as a infant, Husband is waiting for a LUng transplant. He was in [facility]   Family Dynamics   Good Support System? Yes   Barriers   Psychosocial barriers to participate in program The patient should benefit from training in stress management and relaxation.   Screening Interventions   Interventions Encouraged to exercise;Program counselor consult      Quality of Life Scores:     Quality of Life - 03/01/15 1508     Quality of Life Scores   Health/Function Pre 21.37 %   Socioeconomic Pre 29.14 %   Psych/Spiritual Pre 23.14 %   Family Pre 25.2 %   GLOBAL Pre 23.9 %      PHQ-9:     Recent Review Flowsheet Data    Depression screen [facility] 2/9 02/28/2015   Decreased Interest 1   Down, Depressed, Hopeless 0   PHQ - 2 Score 1   Altered sleeping 2   Tired, decreased energy 2   Change in appetite 1   Feeling bad or failure about yourself  0  Trouble concentrating 0   Moving slowly or fidgety/restless 0   Suicidal thoughts 0   PHQ-9 Score 6   Difficult doing work/chores Not difficult at all      Psychosocial Evaluation and Intervention:     Psychosocial Evaluation - 03/01/15 1730    Psychosocial Evaluation & Interventions   Comments Counselor met with Caitlyn Roth today for initial psychosocial evaluation.   She is a 50 year old female who had a heart attack and stent insertion about a month ago.  She has a spouse of 15 years  and lives near his family (who are not considered very supportive)  Caitlyn Roth has multiple health issues with Graves Disease, and ADHD which is treated with medication.  She admits that she does not sleep well and is need of a sleep study but "tired of doctors currently  Her appetite is good.  She admits to a history of anxiety but denies depressive symptoms in the past or currently.  She takes medication for anxiety as needed.  Caitlyn Roth reports she has multiple stressors in her life at this time with spouse's brother living in her home for the past 3 years, and conflict with him as well as spouses's family in general.  Also, Caitlyn Roth reports her spouse  has been sick the past year and had an episode recently that was concerning.  Counselor discussed stress management techniques with Caitlyn Roth during this time.  Caitlyn Roth has goals to exercise with greater ease in the future, increase her stamina and strength and hopefully lose some weight.  Counselor encouraged Caitlyn Roth to  consider asking her doctor for a sleep study in order to promote overall health and wellness and expedite recovery from her cardiac procedure.       Psychosocial Re-Evaluation:     Psychosocial Re-Evaluation      05/03/15 1656           Psychosocial Re-Evaluation   Comments Follow up with Caitlyn Roth today reporting she is noticing more stamina and strength since beginning this program.   She is able to "go all day" now which wasn't the case before.  Caitlyn Roth reports the holidays are "just another day" for her this year since she can't travel due to having eye surgery on 12/27.  She anticipates being back at work and in this program within a week if all goes well.  Caitlyn Roth also has reported that her brother-in-law no longer lives in the home and that has helped with her stress level going down.  Counselor will continue to follow as needed.           Vocational Rehabilitation: Provide vocational rehab assistance to qualifying candidates.   Vocational Rehab Evaluation & Intervention:     Vocational Rehab - 03/01/15 1024    Initial Vocational Rehab Evaluation & Intervention   Assessment shows need for Vocational Rehabilitation No      Education: Education Goals: Education classes will be provided on a weekly basis, covering required topics. Participant will state understanding/return demonstration of topics presented.  Learning Barriers/Preferences:     Learning Barriers/Preferences - 03/01/15 1024    Learning Barriers/Preferences   Learning Barriers None   Learning Preferences None      Education Topics: General Nutrition Guidelines/Fats and Fiber: -Group instruction provided by verbal, written material, models and posters to present the general guidelines for heart healthy nutrition. Gives an explanation and review of dietary fats and fiber.   Controlling Sodium/Reading Food Labels: -  Group verbal and written material supporting the discussion of sodium use in heart  healthy nutrition. Review and explanation with models, verbal and written materials for utilization of the food label.   Exercise Physiology & Risk Factors: - Group verbal and written instruction with models to review the exercise physiology of the cardiovascular system and associated critical values. Details cardiovascular disease risk factors and the goals associated with each risk factor.   Aerobic Exercise & Resistance Training: - Gives group verbal and written discussion on the health impact of inactivity. On the components of aerobic and resistive training programs and the benefits of this training and how to safely progress through these programs.   Flexibility, Balance, General Exercise Guidelines: - Provides group verbal and written instruction on the benefits of flexibility and balance training programs. Provides general exercise guidelines with specific guidelines to those with heart or lung disease. Demonstration and skill practice provided.          Cardiac Rehab from 04/03/2015 in [facility] Cardiac and Pulmonary Rehab   Date  04/03/15   Educator  SW   Instruction Review Code  2- meets goals/outcomes      Stress Management: - Provides group verbal and written instruction about the health risks of elevated stress, cause of high stress, and healthy ways to reduce stress.   Depression: - Provides group verbal and written instruction on the correlation between heart/lung disease and depressed mood, treatment options, and the stigmas associated with seeking treatment.      Cardiac Rehab from 04/03/2015 in [facility] Cardiac and Pulmonary Rehab   Date  03/15/15   Educator  Jeannetta Ellis   Instruction Review Code  2- meets goals/outcomes      Anatomy & Physiology of the Heart: - Group verbal and written instruction and models provide basic cardiac anatomy and physiology, with the coronary electrical and arterial systems. Review of: AMI, Angina, Valve disease, Heart Failure, Cardiac  Arrhythmia, Pacemakers, and the ICD.   Cardiac Procedures: - Group verbal and written instruction and models to describe the testing methods done to diagnose heart disease. Reviews the outcomes of the test results. Describes the treatment choices: Medical Management, Angioplasty, or Coronary Bypass Surgery.      Cardiac Rehab from 04/03/2015 in [facility] Cardiac and Pulmonary Rehab   Date  03/13/15   Educator  SB   Instruction Review Code  2- meets goals/outcomes      Cardiac Medications: - Group verbal and written instruction to review commonly prescribed medications for heart disease. Reviews the medication, class of the drug, and side effects. Includes the steps to properly store meds and maintain the prescription regimen.      Cardiac Rehab from 04/03/2015 in [facility] Cardiac and Pulmonary Rehab   Date  03/20/15   Educator  SB   Instruction Review Code  2- meets goals/outcomes      Go Sex-Intimacy & Heart Disease, Get SMART - Goal Setting: - Group verbal and written instruction through game format to discuss heart disease and the return to sexual intimacy. Provides group verbal and written material to discuss and apply goal setting through the application of the S.M.A.R.T. Method.      Cardiac Rehab from 04/03/2015 in [facility] Cardiac and Pulmonary Rehab   Date  03/13/15   Educator  SB   Instruction Review Code  2- meets goals/outcomes      Other Matters of the Heart: - Provides group verbal, written materials and models to describe Heart Failure, Angina, Valve Disease, and  Diabetes in the realm of heart disease. Includes description of the disease process and treatment options available to the cardiac patient.   Exercise & Equipment Safety: - Individual verbal instruction and demonstration of equipment use and safety with use of the equipment.      Cardiac Rehab from 04/03/2015 in [facility] Cardiac and Pulmonary Rehab   Date  03/01/15   Educator  C. Enterkin,RN   Instruction Review Code  1-  partially meets, needs review/practice      Infection Prevention: - Provides verbal and written material to individual with discussion of infection control including proper hand washing and proper equipment cleaning during exercise session.      Cardiac Rehab from 04/03/2015 in [facility] Cardiac and Pulmonary Rehab   Date  03/01/15   Educator  C. Enterkin,RN   Instruction Review Code  2- meets goals/outcomes      Falls Prevention: - Provides verbal and written material to individual with discussion of falls prevention and safety.      Cardiac Rehab from 04/03/2015 in [facility] Cardiac and Pulmonary Rehab   Date  03/01/15   Educator  C. ENterkinRN   Instruction Review Code  2- meets goals/outcomes      Diabetes: - Individual verbal and written instruction to review signs/symptoms of diabetes, desired ranges of glucose level fasting, after meals and with exercise. Advice that pre and post exercise glucose checks will be done for 3 sessions at entry of program.      Cardiac Rehab from 04/03/2015 in [facility] Cardiac and Pulmonary Rehab   Date  03/01/15   Educator  C. Enterkin, RN   Instruction Review Code  1- partially meets, needs review/practice       Knowledge Questionnaire Score:     Knowledge Questionnaire Score - 03/01/15 1024    Knowledge Questionnaire Score   Pre Score 25      Personal Goals and Risk Factors at Admission:     Personal Goals and Risk Factors at Admission - 02/28/15 1527    Personal Goals and Risk Factors on Admission   Increase Aerobic Exercise and Physical Activity Yes   Intervention While in program, learn and follow the exercise prescription taught. Start at a low level workload and increase workload after able to maintain previous level for 30 minutes. Increase time before increasing intensity.   Quit Smoking Yes  Quit 01/27/2015   Intervention Utilize your health care professional team to help with smoking cessation while in the program. Your doctor can  prescribe medications to aid in cessation. The program can provide information and counseling as needed.   Diabetes Yes   Goal Blood glucose control identified by blood glucose values, HgbA1C. Participant verbalizes understanding of the signs/symptoms of hyper/hypo glycemia, proper foot care and importance of medication and nutrition plan for blood glucose control.   Intervention Provide nutrition & aerobic exercise along with prescribed medications to achieve blood glucose in normal ranges: Fasting 65-99 mg/dL   Hypertension Yes   Goal Participant will see blood pressure controlled within the values of 140/24mm/Hg or within value directed by their physician.   Intervention Provide nutrition & aerobic exercise along with prescribed medications to achieve BP 140/90 or less.   Lipids Yes   Goal Cholesterol controlled with medications as prescribed, with individualized exercise RX and with personalized nutrition plan. Value goals: LDL < 70mg , HDL > 40mg . Participant states understanding of desired cholesterol values and following prescriptions.   Intervention Provide nutrition & aerobic exercise along with prescribed medications  to achieve LDL 70mg , HDL >40mg .   Stress Yes   Goal To meet with psychosocial counselor for stress and relaxation information and guidance. To state understanding of performing relaxation techniques and or identifying personal stressors.   Intervention Provide education on types of stress, identifiying stressors, and ways to cope with stress. Provide demonstration and active practice of relaxation techniques.  Caitlyn Roth recently quit smoking.      Personal Goals and Risk Factors Review:      Goals and Risk Factor Review      04/05/15 1810 05/01/15 1710 05/17/15 0842       Increase Aerobic Exercise and Physical Activity   Goals Progress/Improvement seen  Yes Yes Yes     Comments Patient has completed 16/36 sessions.  Jaselle states exercise is going well.  She has not  noticed any specific changes as a result of exercising on a regual basis in Cardiac Rehab.    Melonia has been progressing well him the program and can exercise continuously for the entire class time. She had a small setback with gout, but has since recovered and returned to her normal exercise routine. The only change we had to make was lower her walking speed on the treadmill and remove the incline. She is gaining muscle strength and this is reflected in her NS workload. She has a very structured plan for home exercise and has an elliptical, treadmill and bike at home. She uses these machines to accumulate at least 30 minutes of exercise on days she doesn't come to class. She seems to have a very good understanding of exercise programming.      Diabetes   Goal Blood glucose control identified by blood glucose values, HgbA1C. Participant verbalizes understanding of the signs/symptoms of hyper/hypo glycemia, proper foot care and importance of medication and nutrition plan for blood glucose control.  Blood sugar is well controlled by diet and exercise per patient.  Last FSBS check in Cardiac Rehab was on October 24th with pre-exercise value of 127 and  117 post exercise.         Progress seen towards goals  Yes      Comments  Brees reports that she has gotten her blood sugars better recently by cutting out her sodas with sugar in them. She has lost 6 lbs she reports since in the Emerg Dept for her foot ulcer.       Hypertension   Goal Participant will see blood pressure controlled within the values of 140/23mm/Hg or within value directed by their physician.  BP well controlled today with initial reading of 122/80 and post relaxation phase BP of 136/80.   Participant will see blood pressure controlled within the values of 140/58mm/Hg or within value directed by their physician.      Progress seen toward goals  Yes      Comments  Iliyana's blood pressure is stable at this point.       Abnormal Lipids   Progress  seen towards goals  Unknown      Comments  To follow up with his primary care MD.       Stress   Progress seen towards goals  Yes      Comments  Medina wants to only attend exercise since she feels her ADHD medicine is wearing off in the afternoon and it is stressful for her to sit through the Cardiac Rehab education portions.          Personal Goals Discharge (Final Personal Goals and  Risk Factors Review):      Goals and Risk Factor Review - 05/17/15 0842    Increase Aerobic Exercise and Physical Activity   Goals Progress/Improvement seen  Yes   Comments Xina has been progressing well him the program and can exercise continuously for the entire class time. She had a small setback with gout, but has since recovered and returned to her normal exercise routine. The only change we had to make was lower her walking speed on the treadmill and remove the incline. She is gaining muscle strength and this is reflected in her NS workload. She has a very structured plan for home exercise and has an elliptical, treadmill and bike at home. She uses these machines to accumulate at least 30 minutes of exercise on days she doesn't come to class. She seems to have a very good understanding of exercise programming.       ITP Comments:     ITP Comments      04/25/15 1033 05/21/15 1355         ITP Comments 30 day review  Continue with ITP  has been out sick since 11/16 visit Ready for 30 day review.  Continue with ITP         Comments:

## 2015-05-24 ENCOUNTER — Emergency Department
Admission: EM | Admit: 2015-05-24 | Discharge: 2015-05-24 | Disposition: A | Discharge status: Home / Self Care | Payer: BLUE CROSS/BLUE SHIELD | Attending: Emergency Medicine | Admitting: Emergency Medicine

## 2015-05-24 ENCOUNTER — Encounter: Payer: BLUE CROSS/BLUE SHIELD | Attending: Cardiology

## 2015-05-24 DIAGNOSIS — I10 Essential (primary) hypertension: Secondary | ICD-10-CM | POA: Diagnosis not present

## 2015-05-24 DIAGNOSIS — Z87891 Personal history of nicotine dependence: Secondary | ICD-10-CM | POA: Insufficient documentation

## 2015-05-24 DIAGNOSIS — K2100 Gastro-esophageal reflux disease with esophagitis, without bleeding: Secondary | ICD-10-CM

## 2015-05-24 DIAGNOSIS — Z79899 Other long term (current) drug therapy: Secondary | ICD-10-CM | POA: Insufficient documentation

## 2015-05-24 DIAGNOSIS — K529 Noninfective gastroenteritis and colitis, unspecified: Secondary | ICD-10-CM | POA: Diagnosis not present

## 2015-05-24 DIAGNOSIS — R1011 Right upper quadrant pain: Secondary | ICD-10-CM | POA: Diagnosis present

## 2015-05-24 DIAGNOSIS — I252 Old myocardial infarction: Secondary | ICD-10-CM | POA: Insufficient documentation

## 2015-05-24 LAB — COMPREHENSIVE METABOLIC PANEL
ALT: 52 U/L (ref 14–54)
ANION GAP: 8 (ref 5–15)
AST: 33 U/L (ref 15–41)
Albumin: 4.3 g/dL (ref 3.5–5.0)
Alkaline Phosphatase: 137 U/L — ABNORMAL HIGH (ref 38–126)
BUN: 15 mg/dL (ref 6–20)
CHLORIDE: 104 mmol/L (ref 101–111)
CO2: 24 mmol/L (ref 22–32)
CREATININE: 1.06 mg/dL — AB (ref 0.44–1.00)
Calcium: 9.6 mg/dL (ref 8.9–10.3)
GFR calc non Af Amer: >60 mL/min (ref >60)
Glucose, Bld: 161 mg/dL — ABNORMAL HIGH (ref 65–99)
POTASSIUM: 3.8 mmol/L (ref 3.5–5.1)
SODIUM: 136 mmol/L (ref 135–145)
Total Bilirubin: 0.9 mg/dL (ref 0.3–1.2)
Total Protein: 7.9 g/dL (ref 6.5–8.1)

## 2015-05-24 LAB — CBC
HEMATOCRIT: 43.4 % (ref 35.0–47.0)
HEMOGLOBIN: 14.7 g/dL (ref 12.0–16.0)
MCH: 29.5 pg (ref 26.0–34.0)
MCHC: 34 g/dL (ref 32.0–36.0)
MCV: 86.9 fL (ref 80.0–100.0)
Platelets: 218 10*3/uL (ref 150–440)
RBC: 4.99 MIL/uL (ref 3.80–5.20)
RDW: 13.9 % (ref 11.5–14.5)
WBC: 9.4 10*3/uL (ref 3.6–11.0)

## 2015-05-24 LAB — TROPONIN I: Troponin I: <0.03 ng/mL (ref <0.031)

## 2015-05-24 LAB — LIPASE, BLOOD: LIPASE: 30 U/L (ref 11–51)

## 2015-05-24 MED ORDER — RANITIDINE HCL 150 MG PO CAPS: 150.0000 mg | ORAL_CAPSULE | Freq: Two times a day (BID) | ORAL | Status: DC

## 2015-05-24 MED ORDER — FAMOTIDINE 20 MG PO TABS
40.0000 mg | ORAL_TABLET | Freq: Once | ORAL | Status: AC
  Administered 2015-05-24: 40 mg via ORAL
  Filled 2015-05-24: qty 2

## 2015-05-24 MED ORDER — METOCLOPRAMIDE HCL 10 MG PO TABS: 10.0000 mg | ORAL_TABLET | Freq: Three times a day (TID) | ORAL | Status: DC

## 2015-05-24 MED ORDER — FAMOTIDINE 20 MG PO TABS
ORAL_TABLET | ORAL | Status: AC
  Administered 2015-05-24: 40 mg via ORAL
  Filled 2015-05-24: qty 1

## 2015-05-24 MED ORDER — METOCLOPRAMIDE HCL 5 MG/ML IJ SOLN
10.0000 mg | Freq: Once | INTRAMUSCULAR | Status: AC
Start: 2015-05-24 — End: 2015-05-24
  Administered 2015-05-24: 10 mg via INTRAVENOUS
  Filled 2015-05-24: qty 2

## 2015-05-24 MED ORDER — GI COCKTAIL ~~LOC~~
30.0000 mL | ORAL | Status: AC
  Administered 2015-05-24: 30 mL via ORAL
  Filled 2015-05-24: qty 30

## 2015-05-24 MED ORDER — SUCRALFATE 1 G PO TABS: 1.0000 g | ORAL_TABLET | Freq: Four times a day (QID) | ORAL | Status: DC

## 2015-05-24 MED ORDER — SODIUM CHLORIDE 0.9 % IV BOLUS (SEPSIS)
1000.0000 mL | Freq: Once | INTRAVENOUS | Status: AC
  Administered 2015-05-24: 1000 mL via INTRAVENOUS

## 2015-05-24 NOTE — Addendum Note (Signed)
Addended by: Rudy Jew on: 05/24/2015 11:35 AM   Modules accepted: Orders

## 2015-05-24 NOTE — ED Notes (Signed)
Pt c/o having vomiting with RUQ pain that started on Monday, states no vomiting since but continues to have abd pain with watery diarrhea

## 2015-05-24 NOTE — ED Notes (Signed)
MD at bedside, pt states sudden onset of RUQ abd pain Sunday, states nausea and vomiting and diaherra

## 2015-05-24 NOTE — ED Provider Notes (Signed)
[facility] Emergency Department Provider Note  ____________________________________________  Time seen: 10:40 AM  I have reviewed the triage vital signs and the nursing notes.   HISTORY  Chief Complaint Abdominal Pain and Diarrhea    HPI Caitlyn Roth is a 50 y.o. female who complains of right upper quadrant abdominal pain for the past 2 days. She is having nausea and vomiting and diarrhea. Not affected by eating. No chest pain or shortness of breath. No fever or syncope. No back pain. No urinary symptoms.     Past Medical History  Diagnosis Date  . Hypertension   . Asthma   . Hyperlipemia   . Graves disease      There are no active problems to display for this patient.    Past Surgical History  Procedure Laterality Date  . Knee arthroscopy    . Cholecystectomy    . Tubal ligation    . Cesarean section    . Sinus surgery with instatrak    . Cardiac catheterization    . Coronary angioplasty    . Cardiac stents       Current Outpatient Rx  Name  Route  Sig  Dispense  Refill  . amphetamine-dextroamphetamine (ADDERALL XR) 30 MG 24 hr capsule   Oral   Take 30 mg by mouth.         Marland Kitchen atorvastatin (LIPITOR) 80 MG tablet   Oral   Take 1 tablet by mouth daily.      1   . BRILINTA 90 MG TABS tablet   Oral   Take 90 mg by mouth every 12 (twelve) hours.      1     Dispense as written.   . Calcium Carb-Ergocalciferol 500-200 MG-UNIT TABS   Oral   Take by mouth.         . clopidogrel (PLAVIX) 75 MG tablet   Oral   Take by mouth.         . desloratadine (CLARINEX) 5 MG tablet   Oral   Take 5 mg by mouth.         Elwin Sleight 200-5 MCG/ACT AERO   Inhalation   Inhale 2 puffs into the lungs 2 (two) times daily.      2     Dispense as written.   . DUREZOL 0.05 % EMUL   Right Eye   Place 1 drop into the right eye 2 (two) times daily.      6     Dispense as written.   . ferrous sulfate 325 (65 FE) MG tablet    Oral   Take 325 mg by mouth.         . ILEVRO 0.3 % ophthalmic suspension   Right Eye   Place 1 drop into the right eye daily.      1     Dispense as written.   Marland Kitchen lisinopril (PRINIVIL,ZESTRIL) 2.5 MG tablet   Oral   Take 1 tablet by mouth daily.      1   . methimazole (TAPAZOLE) 5 MG tablet      Take one-half tablet by mouth daily         . venlafaxine XR (EFFEXOR-XR) 150 MG 24 hr capsule   Oral   Take 150 mg by mouth daily with breakfast.         . metoCLOPramide (REGLAN) 10 MG tablet   Oral   Take 1 tablet (10 mg total) by mouth 4 (four) times  daily -  before meals and at bedtime.   60 tablet   0   . ranitidine (ZANTAC) 150 MG capsule   Oral   Take 1 capsule (150 mg total) by mouth 2 (two) times daily.   28 capsule   0   . sucralfate (CARAFATE) 1 g tablet   Oral   Take 1 tablet (1 g total) by mouth 4 (four) times daily.   120 tablet   1      Allergies Lac bovis; Acyclovir; Cefdinir; Codeine; and Other   No family history on file.  Social History Social History  Substance Use Topics  . Smoking status: Former Smoker -- 1.00 packs/day for 35 years    Types: Cigarettes    Quit date: 01/27/2015  . Smokeless tobacco: Never Used  . Alcohol Use: Yes    Review of Systems  Constitutional:   No fever or chills. No weight changes Eyes:   No blurry vision or double vision.  ENT:   No sore throat. Cardiovascular:   No chest pain. Respiratory:   No dyspnea or cough. Gastrointestinal:   Right upper quadrant abdominal pain with vomiting and diarrhea.  No BRBPR or melena. Genitourinary:   Negative for dysuria, urinary retention, bloody urine, or difficulty urinating. Musculoskeletal:   Negative for back pain. No joint swelling or pain. Skin:   Negative for rash. Neurological:   Negative for headaches, focal weakness or numbness. Psychiatric:  No anxiety or depression.   Endocrine:  No hot/cold intolerance, changes in energy, or sleep  difficulty.  10-point ROS otherwise negative.  ____________________________________________   PHYSICAL EXAM:  VITAL SIGNS: ED Triage Vitals  Enc Vitals Group     BP 05/24/15 0810 120/86 mmHg     Pulse Rate 05/24/15 0810 102     Resp 05/24/15 0810 18     Temp 05/24/15 0810 97.7 F (36.5 C)     Temp Source 05/24/15 0810 Oral     SpO2 05/24/15 0810 100 %     Weight 05/24/15 0810 200 lb (90.719 kg)     Height 05/24/15 0810 5\' 9"  (1.753 m)     Head Cir --      Peak Flow --      Pain Score 05/24/15 0810 1     Pain Loc --      Pain Edu? --      Excl. in GC? --     Vital signs reviewed, nursing assessments reviewed.   Constitutional:   Alert and oriented. Well appearing and in no distress. Eyes:   No scleral icterus. No conjunctival pallor. PERRL. EOMI ENT   Head:   Normocephalic and atraumatic.   Nose:   No congestion/rhinnorhea. No septal hematoma   Mouth/Throat:   Dry mucous membranes, no pharyngeal erythema. No peritonsillar mass. No uvula shift.   Neck:   No stridor. No SubQ emphysema. No meningismus. Hematological/Lymphatic/Immunilogical:   No cervical lymphadenopathy. Cardiovascular:   Slight tachycardia heart rate 100-105. Normal and symmetric distal pulses are present in all extremities. No murmurs, rubs, or gallops. Respiratory:   Normal respiratory effort without tachypnea nor retractions. Breath sounds are clear and equal bilaterally. No wheezes/rales/rhonchi. Gastrointestinal:   Soft with bilateral upper quadrant abdominal pain and tenderness. No distention. There is no CVA tenderness.  No rebound, rigidity, or guarding. Genitourinary:   deferred Musculoskeletal:   Nontender with normal range of motion in all extremities. No joint effusions.  No lower extremity tenderness.  No edema. Neurologic:   Normal  speech and language.  CN 2-10 normal. Motor grossly intact. No pronator drift.  Normal gait. No gross focal neurologic deficits are appreciated.   Skin:    Skin is warm, dry and intact. No rash noted.  No petechiae, purpura, or bullae. Psychiatric:   Mood and affect are normal. Speech and behavior are normal. Patient exhibits appropriate insight and judgment.  ____________________________________________    LABS (pertinent positives/negatives) (all labs ordered are listed, but only abnormal results are displayed) Labs Reviewed  COMPREHENSIVE METABOLIC PANEL - Abnormal; Notable for the following:    Glucose, Bld 161 (*)    Creatinine, Ser 1.06 (*)    Alkaline Phosphatase 137 (*)    All other components within normal limits  LIPASE, BLOOD  CBC  TROPONIN I  URINALYSIS COMPLETEWITH MICROSCOPIC ([facility] ONLY)   ____________________________________________   EKG    ____________________________________________    RADIOLOGY    ____________________________________________   PROCEDURES   ____________________________________________   INITIAL IMPRESSION / ASSESSMENT AND PLAN / ED COURSE  Pertinent labs & imaging results that were available during my care of the patient were reviewed by me and considered in my medical decision making (see chart for details).  Patient is well-appearing no acute distress. Presents with abdominal pain with vomiting and diarrhea, likely to be a viral illness. She is status post cholecystectomy and on exam there is low suspicion for any, located biliary pathology. We'll check labs here and IV fluids and antiemetics.  ----------------------------------------- 2:01 PM on 05/24/2015 -----------------------------------------  Workup negative. Labs are unremarkable including troponin. Patient feels much better after Reglan fluids and antiacids. We'll continue these medications and have her follow up with primary care.     ____________________________________________   FINAL CLINICAL IMPRESSION(S) / ED DIAGNOSES  Final diagnoses:  Gastroesophageal reflux disease with esophagitis   Gastroenteritis      Sharman Cheek, MD 05/24/15 1401

## 2015-05-24 NOTE — ED Notes (Signed)
Pt states unable to give urine sample at this time 

## 2015-05-24 NOTE — Discharge Instructions (Signed)

## 2015-05-25 ENCOUNTER — Inpatient Hospital Stay
Admission: RE | Admit: 2015-05-25 | Discharge: 2015-05-25 | Disposition: A | Discharge status: Home / Self Care | Payer: Self-pay | Source: Ambulatory Visit | Attending: *Deleted | Admitting: *Deleted

## 2015-05-25 ENCOUNTER — Other Ambulatory Visit: Payer: Self-pay | Admitting: Unknown Physician Specialty

## 2015-05-25 ENCOUNTER — Other Ambulatory Visit: Payer: Self-pay | Admitting: *Deleted

## 2015-05-25 DIAGNOSIS — Z9289 Personal history of other medical treatment: Secondary | ICD-10-CM

## 2015-05-25 DIAGNOSIS — R928 Other abnormal and inconclusive findings on diagnostic imaging of breast: Secondary | ICD-10-CM

## 2015-06-15 ENCOUNTER — Ambulatory Visit
Admission: RE | Admit: 2015-06-15 | Discharge: 2015-06-15 | Disposition: A | Discharge status: Home / Self Care | Payer: BLUE CROSS/BLUE SHIELD | Source: Ambulatory Visit | Attending: Unknown Physician Specialty | Admitting: Unknown Physician Specialty

## 2015-06-15 DIAGNOSIS — R928 Other abnormal and inconclusive findings on diagnostic imaging of breast: Secondary | ICD-10-CM | POA: Diagnosis not present

## 2015-06-15 DIAGNOSIS — N63 Unspecified lump in breast: Secondary | ICD-10-CM | POA: Insufficient documentation

## 2015-06-16 ENCOUNTER — Telehealth: Payer: Self-pay | Admitting: *Deleted

## 2015-06-16 NOTE — Progress Notes (Signed)
Cardiac Individual Treatment Plan  Patient Details  Name: Caitlyn Roth MRN: [id number] Date of Birth: 07-24-1965 Referring Provider:  Dalia Heading, MD  Initial Encounter Date:    Visit Diagnosis: Status post myocardial infarction - Plan: CARDIAC REHAB 30 DAY REVIEW  Patient's Home Medications on Admission:  Current outpatient prescriptions:  .  amphetamine-dextroamphetamine (ADDERALL XR) 30 MG 24 hr capsule, Take 30 mg by mouth., Disp: , Rfl:  .  atorvastatin (LIPITOR) 80 MG tablet, Take 1 tablet by mouth daily., Disp: , Rfl: 1 .  BRILINTA 90 MG TABS tablet, Take 90 mg by mouth every 12 (twelve) hours., Disp: , Rfl: 1 .  Calcium Carb-Ergocalciferol 500-200 MG-UNIT TABS, Take by mouth., Disp: , Rfl:  .  clopidogrel (PLAVIX) 75 MG tablet, Take by mouth., Disp: , Rfl:  .  desloratadine (CLARINEX) 5 MG tablet, Take 5 mg by mouth., Disp: , Rfl:  .  DULERA 200-5 MCG/ACT AERO, Inhale 2 puffs into the lungs 2 (two) times daily., Disp: , Rfl: 2 .  DUREZOL 0.05 % EMUL, Place 1 drop into the right eye 2 (two) times daily., Disp: , Rfl: 6 .  ferrous sulfate 325 (65 FE) MG tablet, Take 325 mg by mouth., Disp: , Rfl:  .  ILEVRO 0.3 % ophthalmic suspension, Place 1 drop into the right eye daily., Disp: , Rfl: 1 .  lisinopril (PRINIVIL,ZESTRIL) 2.5 MG tablet, Take 1 tablet by mouth daily., Disp: , Rfl: 1 .  methimazole (TAPAZOLE) 5 MG tablet, Take one-half tablet by mouth daily, Disp: , Rfl:  .  metoCLOPramide (REGLAN) 10 MG tablet, Take 1 tablet (10 mg total) by mouth 4 (four) times daily -  before meals and at bedtime., Disp: 60 tablet, Rfl: 0 .  ranitidine (ZANTAC) 150 MG capsule, Take 1 capsule (150 mg total) by mouth 2 (two) times daily., Disp: 28 capsule, Rfl: 0 .  sucralfate (CARAFATE) 1 g tablet, Take 1 tablet (1 g total) by mouth 4 (four) times daily., Disp: 120 tablet, Rfl: 1 .  venlafaxine XR (EFFEXOR-XR) 150 MG 24 hr capsule, Take 150 mg by mouth daily with breakfast., Disp: , Rfl:    Past Medical History: Past Medical History  Diagnosis Date  . Hypertension   . Asthma   . Hyperlipemia   . Graves disease     Tobacco Use: History  Smoking status  . Former Smoker -- 1.00 packs/day for 35 years  . Types: Cigarettes  . Quit date: 01/27/2015  Smokeless tobacco  . Never Used    Labs: Recent Review Flowsheet Data    There is no flowsheet data to display.       Exercise Target Goals:    Exercise Program Goal: Individual exercise prescription set with THRR, safety & activity barriers. Participant demonstrates ability to understand and report RPE using BORG scale, to self-measure pulse accurately, and to acknowledge the importance of the exercise prescription.  Exercise Prescription Goal: Starting with aerobic activity 30 plus minutes a day, 3 days per week for initial exercise prescription. Provide home exercise prescription and guidelines that participant acknowledges understanding prior to discharge.  Activity Barriers & Risk Stratification:     Activity Barriers & Risk Stratification - 03/01/15 1024    Activity Barriers & Risk Stratification   Risk Stratification High      6 Minute Walk:     6 Minute Walk      02/28/15 0957       6 Minute Walk   Phase Initial  Distance 1570 feet     Walk Time 6 minutes     Resting HR 75 bpm     Resting BP 124/80 mmHg     Max Ex. HR 110 bpm     Max Ex. BP 126/74 mmHg     RPE 11     Symptoms No        Initial Exercise Prescription:     Initial Exercise Prescription - 02/28/15 1000    Date of Initial Exercise Prescription   Date 02/28/15   Treadmill   MPH 2.8   Grade 0   Minutes 10   Bike   Level 0.4   Minutes 10   Recumbant Bike   Level 3   RPM 40   Watts 25   Minutes 15   NuStep   Level 2   Watts 20   Minutes 15   Arm Ergometer   Level 1   Watts 10   Minutes 10   Arm/Foot Ergometer   Level 4   Watts 12   Minutes 10   Cybex   Level 2   RPM 50   Minutes 15   Recumbant  Elliptical   Level 1   RPM 40   Watts 10   Minutes 15   REL-XR   Level 2   Watts 25   Minutes 15   Prescription Details   Frequency (times per week) 3   Duration Progress to 30 minutes of continuous aerobic without signs/symptoms of physical distress   Intensity   THRR REST +  30   Ratings of Perceived Exertion 11-15   Progression Continue progressive overload as per policy without signs/symptoms or physical distress.   Resistance Training   Training Prescription Yes   Weight 2   Reps 10-15      Exercise Prescription Changes:     Exercise Prescription Changes      03/15/15 1700 03/20/15 1200 04/03/15 1600 04/05/15 0741 05/10/15 1504   Exercise Review   Progression Yes Yes Yes Yes Yes   Response to Exercise   Blood Pressure (Admit)  122/84 mmHg 122/84 mmHg 122/80 mmHg 126/80 mmHg   Blood Pressure (Exercise)  158/84 mmHg 158/84 mmHg 144/76 mmHg 130/66 mmHg   Blood Pressure (Exit)  118/82 mmHg 118/82 mmHg 136/80 mmHg 108/64 mmHg   Heart Rate (Admit)  90 bpm 90 bpm 103 bpm 76 bpm   Heart Rate (Exercise)  114 bpm 114 bpm 114 bpm 122 bpm   Heart Rate (Exit)  95 bpm 95 bpm 113 bpm 97 bpm   Rating of Perceived Exertion (Exercise)  9 9 12 12    Symptoms  None None None None   Comments  Reviewed individualized exercise prescription and made increases per departmental policy. Exercise increases were discussed with the patient and they were able to perform the new work loads without issue (no signs or symptoms).  Reviewed individualized exercise prescription and made increases per departmental policy. Exercise increases were discussed with the patient and they were able to perform the new work loads without issue (no signs or symptoms).  Discussed adding one day a week of exercise at home in addition to the three days a week at [facility]. Explained that 150 minutes a week of exercise is the goal for combating and managing chronic health conditions. This volume of exercise is also proven to  give other health benefits. Discussed interval training with [name]. She will incorporate this into her treadmill walking.  Marjona can continuously exercise for  the entire class and his exercise progression will now focus on intensity. We met with her and discussed interval training but after a bout of gout her workloads had to be adjusted and lowered. Ellaree can only walk on the treadmill with no incline and at a slower speed than prior to the gout. Cherrish is exercising regularly at home, we has a bike, elliptical and a treadmill. She accumulates 30 minutes of aerobic exercise on days she doesn't come to class and is very disciplined in this.  Despite lowering her TM workloads, we were able to increase her NS workloads due to increases in leg strength.    Duration  Progress to 30 minutes of continuous aerobic without signs/symptoms of physical distress Progress to 30 minutes of continuous aerobic without signs/symptoms of physical distress Progress to 50 minutes of aerobic without signs/symptoms of physical distress Progress to 50 minutes of aerobic without signs/symptoms of physical distress   Intensity  Rest + 30 Rest + 30 Rest + 30 Rest + 30   Progression  Continue progressive overload as per policy without signs/symptoms or physical distress. Continue progressive overload as per policy without signs/symptoms or physical distress. Continue progressive overload as per policy without signs/symptoms or physical distress. Continue progressive overload as per policy without signs/symptoms or physical distress.   Resistance Training   Training Prescription  Yes Yes Yes Yes   Weight  5 5 5 5    Reps  10-15 10-15 10-15 10-15   Interval Training   Interval Training  No No No No   Treadmill   MPH 3.5 3.5 3.5 3.5 2.8  Workload was adjusted after returning from gout.    Grade 0 0 2 2 0   Minutes 15 20 20 20 20    NuStep   Level  5 5 5 9    Watts  45 45 45 75   Minutes  20 20 20 20    REL-XR   Level 5 5 7 7 7     Watts 80 80 80 80 80   Minutes 15 15 15 15 15      06/05/15 1200           Exercise Review   Progression No  Absent since last review, last visit 05/10/15       Response to Exercise   Symptoms None       Comments Ex. Rx. will need to be evaluated when Chazz returns due to an extended absence. Her fitness level will also be considered.        Duration Progress to 50 minutes of aerobic without signs/symptoms of physical distress       Intensity Rest + 30       Progression Continue progressive overload as per policy without signs/symptoms or physical distress.       Resistance Training   Training Prescription Yes       Weight 5       Reps 10-15       Interval Training   Interval Training No       Treadmill   MPH 2.8  Workload was adjusted after returning from gout.        Grade 0       Minutes 20       NuStep   Level 9       Watts 75       Minutes 20       REL-XR   Level 7       Watts 80  Minutes 15          Discharge Exercise Prescription (Final Exercise Prescription Changes):     Exercise Prescription Changes - 06/05/15 1200    Exercise Review   Progression No  Absent since last review, last visit 05/10/15   Response to Exercise   Symptoms None   Comments Ex. Rx. will need to be evaluated when Yatzari returns due to an extended absence. Her fitness level will also be considered.    Duration Progress to 50 minutes of aerobic without signs/symptoms of physical distress   Intensity Rest + 30   Progression Continue progressive overload as per policy without signs/symptoms or physical distress.   Resistance Training   Training Prescription Yes   Weight 5   Reps 10-15   Interval Training   Interval Training No   Treadmill   MPH 2.8  Workload was adjusted after returning from gout.    Grade 0   Minutes 20   NuStep   Level 9   Watts 75   Minutes 20   REL-XR   Level 7   Watts 80   Minutes 15      Nutrition:  Target Goals: Understanding of nutrition  guidelines, daily intake of sodium 1500mg , cholesterol 200mg , calories 30% from fat and 7% or less from saturated fats, daily to have 5 or more servings of fruits and vegetables.  Biometrics:     Pre Biometrics - 02/28/15 0956    Pre Biometrics   Height 5\' 10"  (1.778 m)   Weight 209 lb 11.2 oz (95.119 kg)   Waist Circumference 39 inches   Hip Circumference 43.5 inches   Waist to Hip Ratio 0.9 %   BMI (Calculated) 30.2       Nutrition Therapy Plan and Nutrition Goals:     Nutrition Therapy & Goals - 02/28/15 1626    Nutrition Therapy   Drug/Food Interactions Statins/Certain Fruits   Intervention Plan   Intervention Using nutrition plan and personal goals to gain a healthy nutrition lifestyle. Add exercise as prescribed.      Nutrition Discharge: Rate Your Plate Scores:   Nutrition Goals Re-Evaluation:     Nutrition Goals Re-Evaluation      05/01/15 1709           Personal Goal #1 Re-Evaluation   Personal Goal #1 Samreet reports she has lost 6 lbs since she has cut out her sodas with sugar in them due to her foot ulcer which she was seen in the Emerg. Dept.        Goal Progress Seen Yes          Psychosocial: Target Goals: Acknowledge presence or absence of depression, maximize coping skills, provide positive support system. Participant is able to verbalize types and ability to use techniques and skills needed for reducing stress and depression.  Initial Review & Psychosocial Screening:     Initial Psych Review & Screening - 03/01/15 1028    Initial Review   Current issues with Current Stress Concerns  Arriona and her family have had multiple heatlh problems. Son [name] heart surgery as a infant, Husband is waiting for a LUng transplant. He was in [facility]   Family Dynamics   Good Support System? Yes   Barriers   Psychosocial barriers to participate in program The patient should benefit from training in stress management and relaxation.   Screening  Interventions   Interventions Encouraged to exercise;Program counselor consult      Quality of  Life Scores:     Quality of Life - 03/01/15 1508    Quality of Life Scores   Health/Function Pre 21.37 %   Socioeconomic Pre 29.14 %   Psych/Spiritual Pre 23.14 %   Family Pre 25.2 %   GLOBAL Pre 23.9 %      PHQ-9:     Recent Review Flowsheet Data    Depression screen [facility] 2/9 02/28/2015   Decreased Interest 1   Down, Depressed, Hopeless 0   PHQ - 2 Score 1   Altered sleeping 2   Tired, decreased energy 2   Change in appetite 1   Feeling bad or failure about yourself  0   Trouble concentrating 0   Moving slowly or fidgety/restless 0   Suicidal thoughts 0   PHQ-9 Score 6   Difficult doing work/chores Not difficult at all      Psychosocial Evaluation and Intervention:     Psychosocial Evaluation - 03/01/15 1730    Psychosocial Evaluation & Interventions   Comments Counselor met with Ms. Rodger today for initial psychosocial evaluation.   She is a 50 year old female who had a heart attack and stent insertion about a month ago.  She has a spouse of 15 years  and lives near his family (who are not considered very supportive)  Ms. Roszak has multiple health issues with Graves Disease, and ADHD which is treated with medication.  She admits that she does not sleep well and is need of a sleep study but "tired of doctors currently  Her appetite is good.  She admits to a history of anxiety but denies depressive symptoms in the past or currently.  She takes medication for anxiety as needed.  Ms. Pardy reports she has multiple stressors in her life at this time with spouse's brother living in her home for the past 3 years, and conflict with him as well as spouses's family in general.  Also, Ms. Klosterman reports her spouse  has been sick the past year and had an episode recently that was concerning.  Counselor discussed stress management techniques with Ms. Bulthuis during this time.  Ms. Volkov  has goals to exercise with greater ease in the future, increase her stamina and strength and hopefully lose some weight.  Counselor encouraged Ms. Postma to consider asking her doctor for a sleep study in order to promote overall health and wellness and expedite recovery from her cardiac procedure.       Psychosocial Re-Evaluation:     Psychosocial Re-Evaluation      05/03/15 1656           Psychosocial Re-Evaluation   Comments Follow up with Ms. Ciocca today reporting she is noticing more stamina and strength since beginning this program.   She is able to "go all day" now which wasn't the case before.  Ms. Ippoliti reports the holidays are "just another day" for her this year since she can't travel due to having eye surgery on 12/27.  She anticipates being back at work and in this program within a week if all goes well.  Ms. Vars also has reported that her brother-in-law no longer lives in the home and that has helped with her stress level going down.  Counselor will continue to follow as needed.           Vocational Rehabilitation: Provide vocational rehab assistance to qualifying candidates.   Vocational Rehab Evaluation & Intervention:     Vocational Rehab - 03/01/15 1024  Initial Vocational Rehab Evaluation & Intervention   Assessment shows need for Vocational Rehabilitation No      Education: Education Goals: Education classes will be provided on a weekly basis, covering required topics. Participant will state understanding/return demonstration of topics presented.  Learning Barriers/Preferences:     Learning Barriers/Preferences - 03/01/15 1024    Learning Barriers/Preferences   Learning Barriers None   Learning Preferences None      Education Topics: General Nutrition Guidelines/Fats and Fiber: -Group instruction provided by verbal, written material, models and posters to present the general guidelines for heart healthy nutrition. Gives an explanation and review  of dietary fats and fiber.   Controlling Sodium/Reading Food Labels: -Group verbal and written material supporting the discussion of sodium use in heart healthy nutrition. Review and explanation with models, verbal and written materials for utilization of the food label.   Exercise Physiology & Risk Factors: - Group verbal and written instruction with models to review the exercise physiology of the cardiovascular system and associated critical values. Details cardiovascular disease risk factors and the goals associated with each risk factor.   Aerobic Exercise & Resistance Training: - Gives group verbal and written discussion on the health impact of inactivity. On the components of aerobic and resistive training programs and the benefits of this training and how to safely progress through these programs.   Flexibility, Balance, General Exercise Guidelines: - Provides group verbal and written instruction on the benefits of flexibility and balance training programs. Provides general exercise guidelines with specific guidelines to those with heart or lung disease. Demonstration and skill practice provided.          Cardiac Rehab from 04/03/2015 in [facility] Cardiac and Pulmonary Rehab   Date  04/03/15   Educator  SW   Instruction Review Code  2- meets goals/outcomes      Stress Management: - Provides group verbal and written instruction about the health risks of elevated stress, cause of high stress, and healthy ways to reduce stress.   Depression: - Provides group verbal and written instruction on the correlation between heart/lung disease and depressed mood, treatment options, and the stigmas associated with seeking treatment.      Cardiac Rehab from 04/03/2015 in [facility] Cardiac and Pulmonary Rehab   Date  03/15/15   Educator  Jeannetta Ellis   Instruction Review Code  2- meets goals/outcomes      Anatomy & Physiology of the Heart: - Group verbal and written instruction and models  provide basic cardiac anatomy and physiology, with the coronary electrical and arterial systems. Review of: AMI, Angina, Valve disease, Heart Failure, Cardiac Arrhythmia, Pacemakers, and the ICD.   Cardiac Procedures: - Group verbal and written instruction and models to describe the testing methods done to diagnose heart disease. Reviews the outcomes of the test results. Describes the treatment choices: Medical Management, Angioplasty, or Coronary Bypass Surgery.      Cardiac Rehab from 04/03/2015 in [facility] Cardiac and Pulmonary Rehab   Date  03/13/15   Educator  SB   Instruction Review Code  2- meets goals/outcomes      Cardiac Medications: - Group verbal and written instruction to review commonly prescribed medications for heart disease. Reviews the medication, class of the drug, and side effects. Includes the steps to properly store meds and maintain the prescription regimen.      Cardiac Rehab from 04/03/2015 in [facility] Cardiac and Pulmonary Rehab   Date  03/20/15   Educator  SB   Instruction Review Code  2- meets goals/outcomes      Go Sex-Intimacy & Heart Disease, Get SMART - Goal Setting: - Group verbal and written instruction through game format to discuss heart disease and the return to sexual intimacy. Provides group verbal and written material to discuss and apply goal setting through the application of the S.M.A.R.T. Method.      Cardiac Rehab from 04/03/2015 in [facility] Cardiac and Pulmonary Rehab   Date  03/13/15   Educator  SB   Instruction Review Code  2- meets goals/outcomes      Other Matters of the Heart: - Provides group verbal, written materials and models to describe Heart Failure, Angina, Valve Disease, and Diabetes in the realm of heart disease. Includes description of the disease process and treatment options available to the cardiac patient.   Exercise & Equipment Safety: - Individual verbal instruction and demonstration of equipment use and safety with use of the  equipment.      Cardiac Rehab from 04/03/2015 in [facility] Cardiac and Pulmonary Rehab   Date  03/01/15   Educator  C. Enterkin,RN   Instruction Review Code  1- partially meets, needs review/practice      Infection Prevention: - Provides verbal and written material to individual with discussion of infection control including proper hand washing and proper equipment cleaning during exercise session.      Cardiac Rehab from 04/03/2015 in [facility] Cardiac and Pulmonary Rehab   Date  03/01/15   Educator  C. Enterkin,RN   Instruction Review Code  2- meets goals/outcomes      Falls Prevention: - Provides verbal and written material to individual with discussion of falls prevention and safety.      Cardiac Rehab from 04/03/2015 in [facility] Cardiac and Pulmonary Rehab   Date  03/01/15   Educator  C. ENterkinRN   Instruction Review Code  2- meets goals/outcomes      Diabetes: - Individual verbal and written instruction to review signs/symptoms of diabetes, desired ranges of glucose level fasting, after meals and with exercise. Advice that pre and post exercise glucose checks will be done for 3 sessions at entry of program.      Cardiac Rehab from 04/03/2015 in [facility] Cardiac and Pulmonary Rehab   Date  03/01/15   Educator  C. Enterkin, RN   Instruction Review Code  1- partially meets, needs review/practice       Knowledge Questionnaire Score:     Knowledge Questionnaire Score - 03/01/15 1024    Knowledge Questionnaire Score   Pre Score 25      Personal Goals and Risk Factors at Admission:     Personal Goals and Risk Factors at Admission - 02/28/15 1527    Personal Goals and Risk Factors on Admission   Increase Aerobic Exercise and Physical Activity Yes   Intervention While in program, learn and follow the exercise prescription taught. Start at a low level workload and increase workload after able to maintain previous level for 30 minutes. Increase time before increasing intensity.   Quit  Smoking Yes  Quit 01/27/2015   Intervention Utilize your health care professional team to help with smoking cessation while in the program. Your doctor can prescribe medications to aid in cessation. The program can provide information and counseling as needed.   Diabetes Yes   Goal Blood glucose control identified by blood glucose values, HgbA1C. Participant verbalizes understanding of the signs/symptoms of hyper/hypo glycemia, proper foot care and importance of medication and nutrition plan for blood glucose control.  Intervention Provide nutrition & aerobic exercise along with prescribed medications to achieve blood glucose in normal ranges: Fasting 65-99 mg/dL   Hypertension Yes   Goal Participant will see blood pressure controlled within the values of 140/44mm/Hg or within value directed by their physician.   Intervention Provide nutrition & aerobic exercise along with prescribed medications to achieve BP 140/90 or less.   Lipids Yes   Goal Cholesterol controlled with medications as prescribed, with individualized exercise RX and with personalized nutrition plan. Value goals: LDL < 70mg , HDL > 40mg . Participant states understanding of desired cholesterol values and following prescriptions.   Intervention Provide nutrition & aerobic exercise along with prescribed medications to achieve LDL 70mg , HDL >40mg .   Stress Yes   Goal To meet with psychosocial counselor for stress and relaxation information and guidance. To state understanding of performing relaxation techniques and or identifying personal stressors.   Intervention Provide education on types of stress, identifiying stressors, and ways to cope with stress. Provide demonstration and active practice of relaxation techniques.  Tondalaya recently quit smoking.      Personal Goals and Risk Factors Review:      Goals and Risk Factor Review      04/05/15 1810 05/01/15 1710 05/17/15 0842 06/05/15 1205     Increase Aerobic Exercise and Physical  Activity   Goals Progress/Improvement seen  Yes Yes Yes No    Comments Patient has completed 16/36 sessions.  Chrystian states exercise is going well.  She has not noticed any specific changes as a result of exercising on a regual basis in Cardiac Rehab.    Dylanie has been progressing well him the program and can exercise continuously for the entire class time. She had a small setback with gout, but has since recovered and returned to her normal exercise routine. The only change we had to make was lower her walking speed on the treadmill and remove the incline. She is gaining muscle strength and this is reflected in her NS workload. She has a very structured plan for home exercise and has an elliptical, treadmill and bike at home. She uses these machines to accumulate at least 30 minutes of exercise on days she doesn't come to class. She seems to have a very good understanding of exercise programming.  Ex. Rx. will need to be evaluated when Suzette returns due to an extended absence. Her fitness level will also be considered.     Diabetes   Goal Blood glucose control identified by blood glucose values, HgbA1C. Participant verbalizes understanding of the signs/symptoms of hyper/hypo glycemia, proper foot care and importance of medication and nutrition plan for blood glucose control.  Blood sugar is well controlled by diet and exercise per patient.  Last FSBS check in [facility] was on October 24th with pre-exercise value of 127 and  117 post exercise.         Progress seen towards goals  Yes      Comments  Sumiko reports that she has gotten her blood sugars better recently by cutting out her sodas with sugar in them. She has lost 6 lbs she reports since in the [facility] for her foot ulcer.       Hypertension   Goal Participant will see blood pressure controlled within the values of 140/24mm/Hg or within value directed by their physician.  BP well controlled today with initial reading of 122/80 and post  relaxation phase BP of 136/80.   Participant will see blood pressure controlled within the  values of 140/28mm/Hg or within value directed by their physician.      Progress seen toward goals  Yes      Comments  Lometa's blood pressure is stable at this point.       Abnormal Lipids   Progress seen towards goals  Unknown      Comments  To follow up with his primary care MD.       Stress   Progress seen towards goals  Yes      Comments  Perina wants to only attend exercise since she feels her ADHD medicine is wearing off in the afternoon and it is stressful for her to sit through the Cardiac Rehab education portions.          Personal Goals Discharge (Final Personal Goals and Risk Factors Review):      Goals and Risk Factor Review - 06/05/15 1205    Increase Aerobic Exercise and Physical Activity   Goals Progress/Improvement seen  No   Comments Ex. Rx. will need to be evaluated when Saphira returns due to an extended absence. Her fitness level will also be considered.       ITP Comments:     ITP Comments      04/25/15 1033 05/21/15 1355 06/16/15 1208       ITP Comments 30 day review  Continue with ITP  has been out sick since 11/16 visit [facility] for 30 day review.  Continue with ITP [facility] fro 30 day review, Continue with ITP.  Frock has been absent tis month, called to see if she plans to return to the program.        Comments:

## 2015-06-16 NOTE — Addendum Note (Signed)
Addended by: Rudy Jew on: 06/16/2015 03:10 PM   Modules accepted: Orders

## 2015-06-16 NOTE — Telephone Encounter (Signed)
Called to check on status to return to program. Spoke with her husband, asked him to have her give [place] a call to verify status with program.

## 2015-06-16 NOTE — Progress Notes (Signed)
Cardiac Individual Treatment Plan  Patient Details  Name: Caitlyn Roth MRN: [id number] Date of Birth: 21-Jul-1965 Referring Provider:  Dalia Heading, MD  Initial Encounter Date:    Visit Diagnosis: Status post myocardial infarction - Plan: CARDIAC REHAB 30 DAY REVIEW  Patient's Home Medications on Admission:  Current outpatient prescriptions:  .  amphetamine-dextroamphetamine (ADDERALL XR) 30 MG 24 hr capsule, Take 30 mg by mouth., Disp: , Rfl:  .  atorvastatin (LIPITOR) 80 MG tablet, Take 1 tablet by mouth daily., Disp: , Rfl: 1 .  BRILINTA 90 MG TABS tablet, Take 90 mg by mouth every 12 (twelve) hours., Disp: , Rfl: 1 .  Calcium Carb-Ergocalciferol 500-200 MG-UNIT TABS, Take by mouth., Disp: , Rfl:  .  clopidogrel (PLAVIX) 75 MG tablet, Take by mouth., Disp: , Rfl:  .  desloratadine (CLARINEX) 5 MG tablet, Take 5 mg by mouth., Disp: , Rfl:  .  DULERA 200-5 MCG/ACT AERO, Inhale 2 puffs into the lungs 2 (two) times daily., Disp: , Rfl: 2 .  DUREZOL 0.05 % EMUL, Place 1 drop into the right eye 2 (two) times daily., Disp: , Rfl: 6 .  ferrous sulfate 325 (65 FE) MG tablet, Take 325 mg by mouth., Disp: , Rfl:  .  ILEVRO 0.3 % ophthalmic suspension, Place 1 drop into the right eye daily., Disp: , Rfl: 1 .  lisinopril (PRINIVIL,ZESTRIL) 2.5 MG tablet, Take 1 tablet by mouth daily., Disp: , Rfl: 1 .  methimazole (TAPAZOLE) 5 MG tablet, Take one-half tablet by mouth daily, Disp: , Rfl:  .  metoCLOPramide (REGLAN) 10 MG tablet, Take 1 tablet (10 mg total) by mouth 4 (four) times daily -  before meals and at bedtime., Disp: 60 tablet, Rfl: 0 .  ranitidine (ZANTAC) 150 MG capsule, Take 1 capsule (150 mg total) by mouth 2 (two) times daily., Disp: 28 capsule, Rfl: 0 .  sucralfate (CARAFATE) 1 g tablet, Take 1 tablet (1 g total) by mouth 4 (four) times daily., Disp: 120 tablet, Rfl: 1 .  venlafaxine XR (EFFEXOR-XR) 150 MG 24 hr capsule, Take 150 mg by mouth daily with breakfast., Disp: , Rfl:    Past Medical History: Past Medical History  Diagnosis Date  . Hypertension   . Asthma   . Hyperlipemia   . Graves disease     Tobacco Use: History  Smoking status  . Former Smoker -- 1.00 packs/day for 35 years  . Types: Cigarettes  . Quit date: 01/27/2015  Smokeless tobacco  . Never Used    Labs: Recent Review Flowsheet Data    There is no flowsheet data to display.       Exercise Target Goals:    Exercise Program Goal: Individual exercise prescription set with THRR, safety & activity barriers. Participant demonstrates ability to understand and report RPE using BORG scale, to self-measure pulse accurately, and to acknowledge the importance of the exercise prescription.  Exercise Prescription Goal: Starting with aerobic activity 30 plus minutes a day, 3 days per week for initial exercise prescription. Provide home exercise prescription and guidelines that participant acknowledges understanding prior to discharge.  Activity Barriers & Risk Stratification:     Activity Barriers & Risk Stratification - 03/01/15 1024    Activity Barriers & Risk Stratification   Risk Stratification High      6 Minute Walk:     6 Minute Walk      02/28/15 0957       6 Minute Walk   Phase Initial  Distance 1570 feet     Walk Time 6 minutes     Resting HR 75 bpm     Resting BP 124/80 mmHg     Max Ex. HR 110 bpm     Max Ex. BP 126/74 mmHg     RPE 11     Symptoms No        Initial Exercise Prescription:     Initial Exercise Prescription - 02/28/15 1000    Date of Initial Exercise Prescription   Date 02/28/15   Treadmill   MPH 2.8   Grade 0   Minutes 10   Bike   Level 0.4   Minutes 10   Recumbant Bike   Level 3   RPM 40   Watts 25   Minutes 15   NuStep   Level 2   Watts 20   Minutes 15   Arm Ergometer   Level 1   Watts 10   Minutes 10   Arm/Foot Ergometer   Level 4   Watts 12   Minutes 10   Cybex   Level 2   RPM 50   Minutes 15   Recumbant  Elliptical   Level 1   RPM 40   Watts 10   Minutes 15   REL-XR   Level 2   Watts 25   Minutes 15   Prescription Details   Frequency (times per week) 3   Duration Progress to 30 minutes of continuous aerobic without signs/symptoms of physical distress   Intensity   THRR REST +  30   Ratings of Perceived Exertion 11-15   Progression Continue progressive overload as per policy without signs/symptoms or physical distress.   Resistance Training   Training Prescription Yes   Weight 2   Reps 10-15      Exercise Prescription Changes:     Exercise Prescription Changes      03/15/15 1700 03/20/15 1200 04/03/15 1600 04/05/15 0741 05/10/15 1504   Exercise Review   Progression Yes Yes Yes Yes Yes   Response to Exercise   Blood Pressure (Admit)  122/84 mmHg 122/84 mmHg 122/80 mmHg 126/80 mmHg   Blood Pressure (Exercise)  158/84 mmHg 158/84 mmHg 144/76 mmHg 130/66 mmHg   Blood Pressure (Exit)  118/82 mmHg 118/82 mmHg 136/80 mmHg 108/64 mmHg   Heart Rate (Admit)  90 bpm 90 bpm 103 bpm 76 bpm   Heart Rate (Exercise)  114 bpm 114 bpm 114 bpm 122 bpm   Heart Rate (Exit)  95 bpm 95 bpm 113 bpm 97 bpm   Rating of Perceived Exertion (Exercise)  9 9 12 12    Symptoms  None None None None   Comments  Reviewed individualized exercise prescription and made increases per departmental policy. Exercise increases were discussed with the patient and they were able to perform the new work loads without issue (no signs or symptoms).  Reviewed individualized exercise prescription and made increases per departmental policy. Exercise increases were discussed with the patient and they were able to perform the new work loads without issue (no signs or symptoms).  Discussed adding one day a week of exercise at home in addition to the three days a week at [facility]. Explained that 150 minutes a week of exercise is the goal for combating and managing chronic health conditions. This volume of exercise is also proven to  give other health benefits. Discussed interval training with Caitlyn Roth. She will incorporate this into her treadmill walking.  Caitlyn Roth can continuously exercise for  Cardiac Individual Treatment Plan  Patient Details  Name: Caitlyn Roth MRN: [id number] Date of Birth: [birth year] Referring Provider:  Teodoro Spray, MD  Initial Encounter Date:    Visit Diagnosis: Status post myocardial infarction - Plan: CARDIAC REHAB 30 DAY REVIEW  Patient's Home Medications on Admission:  Current outpatient prescriptions:  .  amphetamine-dextroamphetamine (ADDERALL XR) 30 MG 24 hr capsule, Take 30 mg by mouth., Disp: , Rfl:  .  atorvastatin (LIPITOR) 80 MG tablet, Take 1 tablet by mouth daily., Disp: , Rfl: 1 .  BRILINTA 90 MG TABS tablet, Take 90 mg by mouth every 12 (twelve) hours., Disp: , Rfl: 1 .  Calcium Carb-Ergocalciferol 500-200 MG-UNIT TABS, Take by mouth., Disp: , Rfl:  .  clopidogrel (PLAVIX) 75 MG tablet, Take by mouth., Disp: , Rfl:  .  desloratadine (CLARINEX) 5 MG tablet, Take 5 mg by mouth., Disp: , Rfl:  .  DULERA 200-5 MCG/ACT AERO, Inhale 2 puffs into the lungs 2 (two) times daily., Disp: , Rfl: 2 .  DUREZOL 0.05 % EMUL, Place 1 drop into the right eye 2 (two) times daily., Disp: , Rfl: 6 .  ferrous sulfate 325 (65 FE) MG tablet, Take 325 mg by mouth., Disp: , Rfl:  .  ILEVRO 0.3 % ophthalmic suspension, Place 1 drop into the right eye daily., Disp: , Rfl: 1 .  lisinopril (PRINIVIL,ZESTRIL) 2.5 MG tablet, Take 1 tablet by mouth daily., Disp: , Rfl: 1 .  methimazole (TAPAZOLE) 5 MG tablet, Take one-half tablet by mouth daily, Disp: , Rfl:  .  metoCLOPramide (REGLAN) 10 MG tablet, Take 1 tablet (10 mg total) by mouth 4 (four) times daily -  before meals and at bedtime., Disp: 60 tablet, Rfl: 0 .  ranitidine (ZANTAC) 150 MG capsule, Take 1 capsule (150 mg total) by mouth 2 (two) times daily., Disp: 28 capsule, Rfl: 0 .  sucralfate (CARAFATE) 1 g tablet, Take 1 tablet (1 g total) by mouth 4 (four) times daily., Disp: 120 tablet, Rfl: 1 .  venlafaxine XR (EFFEXOR-XR) 150 MG 24 hr capsule, Take 150 mg by mouth daily with breakfast., Disp: , Rfl:    Past Medical History: Past Medical History  Diagnosis Date  . Hypertension   . Asthma   . Hyperlipemia   . Graves disease     Tobacco Use: History  Smoking status  . Former Smoker -- 1.00 packs/day for 35 years  . Types: Cigarettes  . Quit date: 01/27/2015  Smokeless tobacco  . Never Used    Labs: Recent Review Flowsheet Data    There is no flowsheet data to display.       Exercise Target Goals:    Exercise Program Goal: Individual exercise prescription set with THRR, safety & activity barriers. Participant demonstrates ability to understand and report RPE using BORG scale, to self-measure pulse accurately, and to acknowledge the importance of the exercise prescription.  Exercise Prescription Goal: Starting with aerobic activity 30 plus minutes a day, 3 days per week for initial exercise prescription. Provide home exercise prescription and guidelines that participant acknowledges understanding prior to discharge.  Activity Barriers & Risk Stratification:     Activity Barriers & Risk Stratification - 03/01/15 1024    Activity Barriers & Risk Stratification   Risk Stratification High      6 Minute Walk:     6 Minute Walk      02/28/15 0957       6 Minute Walk   Phase Initial  Minutes 15          Discharge Exercise Prescription (Final Exercise Prescription Changes):     Exercise Prescription Changes - 06/05/15 1200    Exercise Review   Progression No  Absent since last review, last visit 05/10/15   Response to Exercise   Symptoms None   Comments Ex. Rx. will need to be evaluated when Cena returns due to an extended absence. Her fitness level will also be considered.    Duration Progress to 50 minutes of aerobic without signs/symptoms of physical distress   Intensity Rest + 30   Progression Continue progressive overload as per policy without signs/symptoms or physical distress.   Resistance Training   Training Prescription Yes   Weight 5   Reps 10-15   Interval Training   Interval Training No   Treadmill   MPH 2.8  Workload was adjusted after returning from gout.    Grade 0   Minutes 20   NuStep   Level 9   Watts 75   Minutes 20   REL-XR   Level 7   Watts 80   Minutes 15      Nutrition:  Target Goals: Understanding of nutrition  guidelines, daily intake of sodium 1500mg , cholesterol 200mg , calories 30% from fat and 7% or less from saturated fats, daily to have 5 or more servings of fruits and vegetables.  Biometrics:     Pre Biometrics - 02/28/15 0956    Pre Biometrics   Height 5\' 10"  (1.778 m)   Weight 209 lb 11.2 oz (95.119 kg)   Waist Circumference 39 inches   Hip Circumference 43.5 inches   Waist to Hip Ratio 0.9 %   BMI (Calculated) 30.2       Nutrition Therapy Plan and Nutrition Goals:     Nutrition Therapy & Goals - 02/28/15 1626    Nutrition Therapy   Drug/Food Interactions Statins/Certain Fruits   Intervention Plan   Intervention Using nutrition plan and personal goals to gain a healthy nutrition lifestyle. Add exercise as prescribed.      Nutrition Discharge: Rate Your Plate Scores:   Nutrition Goals Re-Evaluation:     Nutrition Goals Re-Evaluation      05/01/15 1709           Personal Goal #1 Re-Evaluation   Personal Goal #1 Teera reports she has lost 6 lbs since she has cut out her sodas with sugar in them due to her foot ulcer which she was seen in the Emerg. Dept.        Goal Progress Seen Yes          Psychosocial: Target Goals: Acknowledge presence or absence of depression, maximize coping skills, provide positive support system. Participant is able to verbalize types and ability to use techniques and skills needed for reducing stress and depression.  Initial Review & Psychosocial Screening:     Initial Psych Review & Screening - 03/01/15 1028    Initial Review   Current issues with Current Stress Concerns  Malani and her family have had multiple heatlh problems. Son [name] heart surgery as a infant, Husband is waiting for a LUng transplant. He was in [facility]   Family Dynamics   Good Support System? Yes   Barriers   Psychosocial barriers to participate in program The patient should benefit from training in stress management and relaxation.   Screening  Interventions   Interventions Encouraged to exercise;Program counselor consult      Quality of  Cardiac Individual Treatment Plan  Patient Details  Name: Caitlyn Roth MRN: [id number] Date of Birth: [birth year] Referring Provider:  Teodoro Spray, MD  Initial Encounter Date:    Visit Diagnosis: Status post myocardial infarction - Plan: CARDIAC REHAB 30 DAY REVIEW  Patient's Home Medications on Admission:  Current outpatient prescriptions:  .  amphetamine-dextroamphetamine (ADDERALL XR) 30 MG 24 hr capsule, Take 30 mg by mouth., Disp: , Rfl:  .  atorvastatin (LIPITOR) 80 MG tablet, Take 1 tablet by mouth daily., Disp: , Rfl: 1 .  BRILINTA 90 MG TABS tablet, Take 90 mg by mouth every 12 (twelve) hours., Disp: , Rfl: 1 .  Calcium Carb-Ergocalciferol 500-200 MG-UNIT TABS, Take by mouth., Disp: , Rfl:  .  clopidogrel (PLAVIX) 75 MG tablet, Take by mouth., Disp: , Rfl:  .  desloratadine (CLARINEX) 5 MG tablet, Take 5 mg by mouth., Disp: , Rfl:  .  DULERA 200-5 MCG/ACT AERO, Inhale 2 puffs into the lungs 2 (two) times daily., Disp: , Rfl: 2 .  DUREZOL 0.05 % EMUL, Place 1 drop into the right eye 2 (two) times daily., Disp: , Rfl: 6 .  ferrous sulfate 325 (65 FE) MG tablet, Take 325 mg by mouth., Disp: , Rfl:  .  ILEVRO 0.3 % ophthalmic suspension, Place 1 drop into the right eye daily., Disp: , Rfl: 1 .  lisinopril (PRINIVIL,ZESTRIL) 2.5 MG tablet, Take 1 tablet by mouth daily., Disp: , Rfl: 1 .  methimazole (TAPAZOLE) 5 MG tablet, Take one-half tablet by mouth daily, Disp: , Rfl:  .  metoCLOPramide (REGLAN) 10 MG tablet, Take 1 tablet (10 mg total) by mouth 4 (four) times daily -  before meals and at bedtime., Disp: 60 tablet, Rfl: 0 .  ranitidine (ZANTAC) 150 MG capsule, Take 1 capsule (150 mg total) by mouth 2 (two) times daily., Disp: 28 capsule, Rfl: 0 .  sucralfate (CARAFATE) 1 g tablet, Take 1 tablet (1 g total) by mouth 4 (four) times daily., Disp: 120 tablet, Rfl: 1 .  venlafaxine XR (EFFEXOR-XR) 150 MG 24 hr capsule, Take 150 mg by mouth daily with breakfast., Disp: , Rfl:    Past Medical History: Past Medical History  Diagnosis Date  . Hypertension   . Asthma   . Hyperlipemia   . Graves disease     Tobacco Use: History  Smoking status  . Former Smoker -- 1.00 packs/day for 35 years  . Types: Cigarettes  . Quit date: 01/27/2015  Smokeless tobacco  . Never Used    Labs: Recent Review Flowsheet Data    There is no flowsheet data to display.       Exercise Target Goals:    Exercise Program Goal: Individual exercise prescription set with THRR, safety & activity barriers. Participant demonstrates ability to understand and report RPE using BORG scale, to self-measure pulse accurately, and to acknowledge the importance of the exercise prescription.  Exercise Prescription Goal: Starting with aerobic activity 30 plus minutes a day, 3 days per week for initial exercise prescription. Provide home exercise prescription and guidelines that participant acknowledges understanding prior to discharge.  Activity Barriers & Risk Stratification:     Activity Barriers & Risk Stratification - 03/01/15 1024    Activity Barriers & Risk Stratification   Risk Stratification High      6 Minute Walk:     6 Minute Walk      02/28/15 0957       6 Minute Walk   Phase Initial  Cardiac Individual Treatment Plan  Patient Details  Name: Caitlyn Roth MRN: [id number] Date of Birth: [birth year] Referring Provider:  Teodoro Spray, MD  Initial Encounter Date:    Visit Diagnosis: Status post myocardial infarction - Plan: CARDIAC REHAB 30 DAY REVIEW  Patient's Home Medications on Admission:  Current outpatient prescriptions:  .  amphetamine-dextroamphetamine (ADDERALL XR) 30 MG 24 hr capsule, Take 30 mg by mouth., Disp: , Rfl:  .  atorvastatin (LIPITOR) 80 MG tablet, Take 1 tablet by mouth daily., Disp: , Rfl: 1 .  BRILINTA 90 MG TABS tablet, Take 90 mg by mouth every 12 (twelve) hours., Disp: , Rfl: 1 .  Calcium Carb-Ergocalciferol 500-200 MG-UNIT TABS, Take by mouth., Disp: , Rfl:  .  clopidogrel (PLAVIX) 75 MG tablet, Take by mouth., Disp: , Rfl:  .  desloratadine (CLARINEX) 5 MG tablet, Take 5 mg by mouth., Disp: , Rfl:  .  DULERA 200-5 MCG/ACT AERO, Inhale 2 puffs into the lungs 2 (two) times daily., Disp: , Rfl: 2 .  DUREZOL 0.05 % EMUL, Place 1 drop into the right eye 2 (two) times daily., Disp: , Rfl: 6 .  ferrous sulfate 325 (65 FE) MG tablet, Take 325 mg by mouth., Disp: , Rfl:  .  ILEVRO 0.3 % ophthalmic suspension, Place 1 drop into the right eye daily., Disp: , Rfl: 1 .  lisinopril (PRINIVIL,ZESTRIL) 2.5 MG tablet, Take 1 tablet by mouth daily., Disp: , Rfl: 1 .  methimazole (TAPAZOLE) 5 MG tablet, Take one-half tablet by mouth daily, Disp: , Rfl:  .  metoCLOPramide (REGLAN) 10 MG tablet, Take 1 tablet (10 mg total) by mouth 4 (four) times daily -  before meals and at bedtime., Disp: 60 tablet, Rfl: 0 .  ranitidine (ZANTAC) 150 MG capsule, Take 1 capsule (150 mg total) by mouth 2 (two) times daily., Disp: 28 capsule, Rfl: 0 .  sucralfate (CARAFATE) 1 g tablet, Take 1 tablet (1 g total) by mouth 4 (four) times daily., Disp: 120 tablet, Rfl: 1 .  venlafaxine XR (EFFEXOR-XR) 150 MG 24 hr capsule, Take 150 mg by mouth daily with breakfast., Disp: , Rfl:    Past Medical History: Past Medical History  Diagnosis Date  . Hypertension   . Asthma   . Hyperlipemia   . Graves disease     Tobacco Use: History  Smoking status  . Former Smoker -- 1.00 packs/day for 35 years  . Types: Cigarettes  . Quit date: 01/27/2015  Smokeless tobacco  . Never Used    Labs: Recent Review Flowsheet Data    There is no flowsheet data to display.       Exercise Target Goals:    Exercise Program Goal: Individual exercise prescription set with THRR, safety & activity barriers. Participant demonstrates ability to understand and report RPE using BORG scale, to self-measure pulse accurately, and to acknowledge the importance of the exercise prescription.  Exercise Prescription Goal: Starting with aerobic activity 30 plus minutes a day, 3 days per week for initial exercise prescription. Provide home exercise prescription and guidelines that participant acknowledges understanding prior to discharge.  Activity Barriers & Risk Stratification:     Activity Barriers & Risk Stratification - 03/01/15 1024    Activity Barriers & Risk Stratification   Risk Stratification High      6 Minute Walk:     6 Minute Walk      02/28/15 0957       6 Minute Walk   Phase Initial  Cardiac Individual Treatment Plan  Patient Details  Name: Caitlyn Roth MRN: [id number] Date of Birth: [birth year] Referring Provider:  Teodoro Spray, MD  Initial Encounter Date:    Visit Diagnosis: Status post myocardial infarction - Plan: CARDIAC REHAB 30 DAY REVIEW  Patient's Home Medications on Admission:  Current outpatient prescriptions:  .  amphetamine-dextroamphetamine (ADDERALL XR) 30 MG 24 hr capsule, Take 30 mg by mouth., Disp: , Rfl:  .  atorvastatin (LIPITOR) 80 MG tablet, Take 1 tablet by mouth daily., Disp: , Rfl: 1 .  BRILINTA 90 MG TABS tablet, Take 90 mg by mouth every 12 (twelve) hours., Disp: , Rfl: 1 .  Calcium Carb-Ergocalciferol 500-200 MG-UNIT TABS, Take by mouth., Disp: , Rfl:  .  clopidogrel (PLAVIX) 75 MG tablet, Take by mouth., Disp: , Rfl:  .  desloratadine (CLARINEX) 5 MG tablet, Take 5 mg by mouth., Disp: , Rfl:  .  DULERA 200-5 MCG/ACT AERO, Inhale 2 puffs into the lungs 2 (two) times daily., Disp: , Rfl: 2 .  DUREZOL 0.05 % EMUL, Place 1 drop into the right eye 2 (two) times daily., Disp: , Rfl: 6 .  ferrous sulfate 325 (65 FE) MG tablet, Take 325 mg by mouth., Disp: , Rfl:  .  ILEVRO 0.3 % ophthalmic suspension, Place 1 drop into the right eye daily., Disp: , Rfl: 1 .  lisinopril (PRINIVIL,ZESTRIL) 2.5 MG tablet, Take 1 tablet by mouth daily., Disp: , Rfl: 1 .  methimazole (TAPAZOLE) 5 MG tablet, Take one-half tablet by mouth daily, Disp: , Rfl:  .  metoCLOPramide (REGLAN) 10 MG tablet, Take 1 tablet (10 mg total) by mouth 4 (four) times daily -  before meals and at bedtime., Disp: 60 tablet, Rfl: 0 .  ranitidine (ZANTAC) 150 MG capsule, Take 1 capsule (150 mg total) by mouth 2 (two) times daily., Disp: 28 capsule, Rfl: 0 .  sucralfate (CARAFATE) 1 g tablet, Take 1 tablet (1 g total) by mouth 4 (four) times daily., Disp: 120 tablet, Rfl: 1 .  venlafaxine XR (EFFEXOR-XR) 150 MG 24 hr capsule, Take 150 mg by mouth daily with breakfast., Disp: , Rfl:    Past Medical History: Past Medical History  Diagnosis Date  . Hypertension   . Asthma   . Hyperlipemia   . Graves disease     Tobacco Use: History  Smoking status  . Former Smoker -- 1.00 packs/day for 35 years  . Types: Cigarettes  . Quit date: 01/27/2015  Smokeless tobacco  . Never Used    Labs: Recent Review Flowsheet Data    There is no flowsheet data to display.       Exercise Target Goals:    Exercise Program Goal: Individual exercise prescription set with THRR, safety & activity barriers. Participant demonstrates ability to understand and report RPE using BORG scale, to self-measure pulse accurately, and to acknowledge the importance of the exercise prescription.  Exercise Prescription Goal: Starting with aerobic activity 30 plus minutes a day, 3 days per week for initial exercise prescription. Provide home exercise prescription and guidelines that participant acknowledges understanding prior to discharge.  Activity Barriers & Risk Stratification:     Activity Barriers & Risk Stratification - 03/01/15 1024    Activity Barriers & Risk Stratification   Risk Stratification High      6 Minute Walk:     6 Minute Walk      02/28/15 0957       6 Minute Walk   Phase Initial  Intervention Provide nutrition & aerobic exercise along with prescribed medications to achieve blood glucose in normal ranges: Fasting 65-99 mg/dL   Hypertension Yes   Goal Participant will see blood pressure controlled within the values of 140/38mm/Hg or within value directed by their physician.   Intervention Provide nutrition & aerobic exercise along with prescribed medications to achieve BP 140/90 or less.   Lipids Yes   Goal Cholesterol controlled with medications as prescribed, with individualized exercise RX and with personalized nutrition plan. Value goals: LDL < 70mg , HDL > 40mg . Participant states understanding of desired cholesterol values and following prescriptions.   Intervention Provide nutrition & aerobic exercise along with prescribed medications to achieve LDL 70mg , HDL >40mg .   Stress Yes   Goal To meet with psychosocial counselor for stress and relaxation information and guidance. To state understanding of performing relaxation techniques and or identifying personal stressors.   Intervention Provide education on types of stress, identifiying stressors, and ways to cope with stress. Provide demonstration and active practice of relaxation techniques.  Shirell recently quit smoking.      Personal Goals and Risk Factors Review:      Goals and Risk Factor Review      04/05/15 1810 05/01/15 1710 05/17/15 0842 06/05/15 1205     Increase Aerobic Exercise and Physical  Activity   Goals Progress/Improvement seen  Yes Yes Yes No    Comments Patient has completed 16/36 sessions.  Deboraha states exercise is going well.  She has not noticed any specific changes as a result of exercising on a regual basis in [facility]hab.    Adeli has been progressing well him the program and can exercise continuously for the entire class time. She had a small setback with gout, but has since recovered and returned to her normal exercise routine. The only change we had to make was lower her walking speed on the treadmill and remove the incline. She is gaining muscle strength and this is reflected in her NS workload. She has a very structured plan for home exercise and has an elliptical, treadmill and bike at home. She uses these machines to accumulate at least 30 minutes of exercise on days she doesn't come to class. She seems to have a very good understanding of exercise programming.  Ex. Rx. will need to be evaluated when Yulieth returns due to an extended absence. Her fitness level will also be considered.     Diabetes   Goal Blood glucose control identified by blood glucose values, HgbA1C. Participant verbalizes understanding of the signs/symptoms of hyper/hypo glycemia, proper foot care and importance of medication and nutrition plan for blood glucose control.  Blood sugar is well controlled by diet and exercise per patient.  Last FSBS check in Cardiac Rehab was on October 24th with pre-exercise value of 127 and  117 post exercise.         Progress seen towards goals  Yes      Comments  Xi reports that she has gotten her blood sugars better recently by cutting out her sodas with sugar in them. She has lost 6 lbs she reports since in the Emerg Dept for her foot ulcer.       Hypertension   Goal Participant will see blood pressure controlled within the values of 140/92mm/Hg or within value directed by their physician.  BP well controlled today with initial reading of 122/80 and post  relaxation phase BP of 136/80.   Participant will see blood pressure controlled within the  Cardiac Individual Treatment Plan  Patient Details  Name: Caitlyn Roth MRN: [id number] Date of Birth: [birth year] Referring Provider:  Teodoro Spray, MD  Initial Encounter Date:    Visit Diagnosis: Status post myocardial infarction - Plan: CARDIAC REHAB 30 DAY REVIEW  Patient's Home Medications on Admission:  Current outpatient prescriptions:  .  amphetamine-dextroamphetamine (ADDERALL XR) 30 MG 24 hr capsule, Take 30 mg by mouth., Disp: , Rfl:  .  atorvastatin (LIPITOR) 80 MG tablet, Take 1 tablet by mouth daily., Disp: , Rfl: 1 .  BRILINTA 90 MG TABS tablet, Take 90 mg by mouth every 12 (twelve) hours., Disp: , Rfl: 1 .  Calcium Carb-Ergocalciferol 500-200 MG-UNIT TABS, Take by mouth., Disp: , Rfl:  .  clopidogrel (PLAVIX) 75 MG tablet, Take by mouth., Disp: , Rfl:  .  desloratadine (CLARINEX) 5 MG tablet, Take 5 mg by mouth., Disp: , Rfl:  .  DULERA 200-5 MCG/ACT AERO, Inhale 2 puffs into the lungs 2 (two) times daily., Disp: , Rfl: 2 .  DUREZOL 0.05 % EMUL, Place 1 drop into the right eye 2 (two) times daily., Disp: , Rfl: 6 .  ferrous sulfate 325 (65 FE) MG tablet, Take 325 mg by mouth., Disp: , Rfl:  .  ILEVRO 0.3 % ophthalmic suspension, Place 1 drop into the right eye daily., Disp: , Rfl: 1 .  lisinopril (PRINIVIL,ZESTRIL) 2.5 MG tablet, Take 1 tablet by mouth daily., Disp: , Rfl: 1 .  methimazole (TAPAZOLE) 5 MG tablet, Take one-half tablet by mouth daily, Disp: , Rfl:  .  metoCLOPramide (REGLAN) 10 MG tablet, Take 1 tablet (10 mg total) by mouth 4 (four) times daily -  before meals and at bedtime., Disp: 60 tablet, Rfl: 0 .  ranitidine (ZANTAC) 150 MG capsule, Take 1 capsule (150 mg total) by mouth 2 (two) times daily., Disp: 28 capsule, Rfl: 0 .  sucralfate (CARAFATE) 1 g tablet, Take 1 tablet (1 g total) by mouth 4 (four) times daily., Disp: 120 tablet, Rfl: 1 .  venlafaxine XR (EFFEXOR-XR) 150 MG 24 hr capsule, Take 150 mg by mouth daily with breakfast., Disp: , Rfl:    Past Medical History: Past Medical History  Diagnosis Date  . Hypertension   . Asthma   . Hyperlipemia   . Graves disease     Tobacco Use: History  Smoking status  . Former Smoker -- 1.00 packs/day for 35 years  . Types: Cigarettes  . Quit date: 01/27/2015  Smokeless tobacco  . Never Used    Labs: Recent Review Flowsheet Data    There is no flowsheet data to display.       Exercise Target Goals:    Exercise Program Goal: Individual exercise prescription set with THRR, safety & activity barriers. Participant demonstrates ability to understand and report RPE using BORG scale, to self-measure pulse accurately, and to acknowledge the importance of the exercise prescription.  Exercise Prescription Goal: Starting with aerobic activity 30 plus minutes a day, 3 days per week for initial exercise prescription. Provide home exercise prescription and guidelines that participant acknowledges understanding prior to discharge.  Activity Barriers & Risk Stratification:     Activity Barriers & Risk Stratification - 03/01/15 1024    Activity Barriers & Risk Stratification   Risk Stratification High      6 Minute Walk:     6 Minute Walk      02/28/15 0957       6 Minute Walk   Phase Initial

## 2015-06-16 NOTE — Progress Notes (Signed)
Discharge Summary  Patient Details  Name: Caitlyn Roth MRN: [id number] Date of Birth: 04-04-1966 Referring Provider:  Dalia Heading, MD   Number of Visits: 19/36   Reason for Discharge:  Early Exit:  Personal  Smoking History:  History  Smoking status  . Former Smoker -- 1.00 packs/day for 35 years  . Types: Cigarettes  . Quit date: 01/27/2015  Smokeless tobacco  . Never Used    Diagnosis:  Status post myocardial infarction - Plan: CARDIAC REHAB 30 DAY REVIEW, CARDIAC REHAB 30 DAY REVIEW  ADL UCSD:   Initial Exercise Prescription:     Initial Exercise Prescription - 02/28/15 1000    Date of Initial Exercise Prescription   Date 02/28/15   Treadmill   MPH 2.8   Grade 0   Minutes 10   Bike   Level 0.4   Minutes 10   Recumbant Bike   Level 3   RPM 40   Watts 25   Minutes 15   NuStep   Level 2   Watts 20   Minutes 15   Arm Ergometer   Level 1   Watts 10   Minutes 10   Arm/Foot Ergometer   Level 4   Watts 12   Minutes 10   Cybex   Level 2   RPM 50   Minutes 15   Recumbant Elliptical   Level 1   RPM 40   Watts 10   Minutes 15   REL-XR   Level 2   Watts 25   Minutes 15   Prescription Details   Frequency (times per week) 3   Duration Progress to 30 minutes of continuous aerobic without signs/symptoms of physical distress   Intensity   THRR REST +  30   Ratings of Perceived Exertion 11-15   Progression Continue progressive overload as per policy without signs/symptoms or physical distress.   Resistance Training   Training Prescription Yes   Weight 2   Reps 10-15      Discharge Exercise Prescription (Final Exercise Prescription Changes):     Exercise Prescription Changes - 06/05/15 1200    Exercise Review   Progression No  Absent since last review, last visit 05/10/15   Response to Exercise   Symptoms None   Comments Ex. Rx. will need to be evaluated when Caitlyn Roth returns due to an extended absence. Caitlyn Roth fitness level will also be  considered.    Duration Progress to 50 minutes of aerobic without signs/symptoms of physical distress   Intensity Rest + 30   Progression Continue progressive overload as per policy without signs/symptoms or physical distress.   Resistance Training   Training Prescription Yes   Weight 5   Reps 10-15   Interval Training   Interval Training No   Treadmill   MPH 2.8  Workload was adjusted after returning from gout.    Grade 0   Minutes 20   NuStep   Level 9   Watts 75   Minutes 20   REL-XR   Level 7   Watts 80   Minutes 15      Functional Capacity:     6 Minute Walk      02/28/15 0957       6 Minute Walk   Phase Initial     Distance 1570 feet     Walk Time 6 minutes     Resting HR 75 bpm     Resting BP 124/80 mmHg     Max  Ex. HR 110 bpm     Max Ex. BP 126/74 mmHg     RPE 11     Symptoms No        Psychological, QOL, Others - Outcomes: PHQ 2/9: Depression screen PHQ 2/9 02/28/2015  Decreased Interest 1  Down, Depressed, Hopeless 0  PHQ - 2 Score 1  Altered sleeping 2  Tired, decreased energy 2  Change in appetite 1  Feeling bad or failure about yourself  0  Trouble concentrating 0  Moving slowly or fidgety/restless 0  Suicidal thoughts 0  PHQ-9 Score 6  Difficult doing work/chores Not difficult at all    Quality of Life:     Quality of Life - 03/01/15 1508    Quality of Life Scores   Health/Function Pre 21.37 %   Socioeconomic Pre 29.14 %   Psych/Spiritual Pre 23.14 %   Family Pre 25.2 %   GLOBAL Pre 23.9 %      Personal Goals: Goals established at orientation with interventions provided to work toward goal.     Personal Goals and Risk Factors at Admission - 02/28/15 1527    Personal Goals and Risk Factors on Admission   Increase Aerobic Exercise and Physical Activity Yes   Intervention While in program, learn and follow the exercise prescription taught. Start at a low level workload and increase workload after able to maintain previous  level for 30 minutes. Increase time before increasing intensity.   Quit Smoking Yes  Quit 01/27/2015   Intervention Utilize your health care professional team to help with smoking cessation while in the program. Your doctor can prescribe medications to aid in cessation. The program can provide information and counseling as needed.   Diabetes Yes   Goal Blood glucose control identified by blood glucose values, HgbA1C. Participant verbalizes understanding of the signs/symptoms of hyper/hypo glycemia, proper foot care and importance of medication and nutrition plan for blood glucose control.   Intervention Provide nutrition & aerobic exercise along with prescribed medications to achieve blood glucose in normal ranges: Fasting 65-99 mg/dL   Hypertension Yes   Goal Participant will see blood pressure controlled within the values of 140/48mm/Hg or within value directed by their physician.   Intervention Provide nutrition & aerobic exercise along with prescribed medications to achieve BP 140/90 or less.   Lipids Yes   Goal Cholesterol controlled with medications as prescribed, with individualized exercise RX and with personalized nutrition plan. Value goals: LDL < 70mg , HDL > 40mg . Participant states understanding of desired cholesterol values and following prescriptions.   Intervention Provide nutrition & aerobic exercise along with prescribed medications to achieve LDL 70mg , HDL >40mg .   Stress Yes   Goal To meet with psychosocial counselor for stress and relaxation information and guidance. To state understanding of performing relaxation techniques and or identifying personal stressors.   Intervention Provide education on types of stress, identifiying stressors, and ways to cope with stress. Provide demonstration and active practice of relaxation techniques.  Caitlyn Roth recently quit smoking.       Personal Goals Discharge:     Goals and Risk Factor Review      04/05/15 1810 05/01/15 1710 05/17/15 0842  06/05/15 1205     Increase Aerobic Exercise and Physical Activity   Goals Progress/Improvement seen  Yes Yes Yes No    Comments Patient has completed 16/36 sessions.  Caitlyn Roth states exercise is going well.  Caitlyn Roth has not noticed any specific changes as a result of exercising on a regual  basis in Cardiac Rehab.    Caitlyn Roth has been progressing well him the program and can exercise continuously for the entire class time. Caitlyn Roth had a small setback with gout, but has since recovered and returned to Caitlyn Roth normal exercise routine. The only change we had to make was lower Caitlyn Roth walking speed on the treadmill and remove the incline. Caitlyn Roth is gaining muscle strength and this is reflected in Caitlyn Roth NS workload. Caitlyn Roth has a very structured plan for home exercise and has an elliptical, treadmill and bike at home. Caitlyn Roth uses these machines to accumulate at least 30 minutes of exercise on days Caitlyn Roth doesn't come to class. Caitlyn Roth seems to have a very good understanding of exercise programming.  Ex. Rx. will need to be evaluated when Caitlyn Roth returns due to an extended absence. Caitlyn Roth fitness level will also be considered.     Diabetes   Goal Blood glucose control identified by blood glucose values, HgbA1C. Participant verbalizes understanding of the signs/symptoms of hyper/hypo glycemia, proper foot care and importance of medication and nutrition plan for blood glucose control.  Blood sugar is well controlled by diet and exercise per patient.  Last FSBS check in Cardiac Rehab was on October 24th with pre-exercise value of 127 and  117 post exercise.         Progress seen towards goals  Yes      Comments  Caitlyn Roth reports that Caitlyn Roth has gotten Caitlyn Roth blood sugars better recently by cutting out Caitlyn Roth sodas with sugar in them. Caitlyn Roth has lost 6 lbs Caitlyn Roth reports since in the Emerg Dept for Caitlyn Roth foot ulcer.       Hypertension   Goal Participant will see blood pressure controlled within the values of 140/37mm/Hg or within value directed by their physician.  BP well  controlled today with initial reading of 122/80 and post relaxation phase BP of 136/80.   Participant will see blood pressure controlled within the values of 140/66mm/Hg or within value directed by their physician.      Progress seen toward goals  Yes      Comments  Caitlyn Roth blood pressure is stable at this point.       Abnormal Lipids   Progress seen towards goals  Unknown      Comments  To follow up with his primary care MD.       Stress   Progress seen towards goals  Yes      Comments  Caitlyn Roth wants to only attend exercise since Caitlyn Roth feels Caitlyn Roth ADHD medicine is wearing off in the afternoon and it is stressful for Caitlyn Roth to sit through the Cardiac Rehab education portions.          Nutrition & Weight - Outcomes:     Pre Biometrics - 02/28/15 0956    Pre Biometrics   Height 5\' 10"  (1.778 m)   Weight 209 lb 11.2 oz (95.119 kg)   Waist Circumference 39 inches   Hip Circumference 43.5 inches   Waist to Hip Ratio 0.9 %   BMI (Calculated) 30.2       Nutrition:     Nutrition Therapy & Goals - 02/28/15 1626    Nutrition Therapy   Drug/Food Interactions Statins/Certain Fruits   Intervention Plan   Intervention Using nutrition plan and personal goals to gain a healthy nutrition lifestyle. Add exercise as prescribed.      Nutrition Discharge:   Education Questionnaire Score:     Knowledge Questionnaire Score - 03/01/15 1024    Knowledge Questionnaire  Score   Pre Score 25      Goals reviewed with patient; copy given to patient.

## 2015-06-20 ENCOUNTER — Other Ambulatory Visit: Payer: Self-pay | Admitting: Unknown Physician Specialty

## 2015-06-20 DIAGNOSIS — N63 Unspecified lump in unspecified breast: Secondary | ICD-10-CM

## 2015-06-28 ENCOUNTER — Other Ambulatory Visit: Payer: Self-pay | Admitting: Unknown Physician Specialty

## 2015-06-28 ENCOUNTER — Ambulatory Visit
Admission: RE | Admit: 2015-06-28 | Discharge: 2015-06-28 | Disposition: A | Discharge status: Home / Self Care | Payer: BLUE CROSS/BLUE SHIELD | Source: Ambulatory Visit | Attending: Unknown Physician Specialty | Admitting: Unknown Physician Specialty

## 2015-06-28 DIAGNOSIS — N6002 Solitary cyst of left breast: Secondary | ICD-10-CM | POA: Diagnosis present

## 2015-06-28 DIAGNOSIS — N63 Unspecified lump in unspecified breast: Secondary | ICD-10-CM

## 2016-03-05 ENCOUNTER — Emergency Department
Admission: EM | Admit: 2016-03-05 | Discharge: 2016-03-05 | Disposition: A | Discharge status: Home / Self Care | Payer: BLUE CROSS/BLUE SHIELD | Attending: Emergency Medicine | Admitting: Emergency Medicine

## 2016-03-05 ENCOUNTER — Encounter: Payer: Self-pay | Admitting: Emergency Medicine

## 2016-03-05 DIAGNOSIS — Y929 Unspecified place or not applicable: Secondary | ICD-10-CM | POA: Diagnosis not present

## 2016-03-05 DIAGNOSIS — Z87891 Personal history of nicotine dependence: Secondary | ICD-10-CM | POA: Insufficient documentation

## 2016-03-05 DIAGNOSIS — S81811A Laceration without foreign body, right lower leg, initial encounter: Secondary | ICD-10-CM | POA: Diagnosis not present

## 2016-03-05 DIAGNOSIS — Z23 Encounter for immunization: Secondary | ICD-10-CM | POA: Insufficient documentation

## 2016-03-05 DIAGNOSIS — Y9389 Activity, other specified: Secondary | ICD-10-CM | POA: Diagnosis not present

## 2016-03-05 DIAGNOSIS — Z79899 Other long term (current) drug therapy: Secondary | ICD-10-CM | POA: Insufficient documentation

## 2016-03-05 DIAGNOSIS — S81851A Open bite, right lower leg, initial encounter: Secondary | ICD-10-CM | POA: Diagnosis present

## 2016-03-05 DIAGNOSIS — Y999 Unspecified external cause status: Secondary | ICD-10-CM | POA: Insufficient documentation

## 2016-03-05 DIAGNOSIS — I10 Essential (primary) hypertension: Secondary | ICD-10-CM | POA: Insufficient documentation

## 2016-03-05 DIAGNOSIS — W540XXA Bitten by dog, initial encounter: Secondary | ICD-10-CM | POA: Insufficient documentation

## 2016-03-05 DIAGNOSIS — J45909 Unspecified asthma, uncomplicated: Secondary | ICD-10-CM | POA: Diagnosis not present

## 2016-03-05 MED ORDER — LIDOCAINE HCL (PF) 1 % IJ SOLN
5.0000 mL | Freq: Once | INTRAMUSCULAR | Status: AC
  Administered 2016-03-05: 5 mL
  Filled 2016-03-05: qty 5

## 2016-03-05 MED ORDER — AMOXICILLIN-POT CLAVULANATE 875-125 MG PO TABS: 1.0000 | ORAL_TABLET | Freq: Two times a day (BID) | ORAL | 0 refills | Status: AC

## 2016-03-05 MED ORDER — TETANUS-DIPHTH-ACELL PERTUSSIS 5-2.5-18.5 LF-MCG/0.5 IM SUSP
0.5000 mL | Freq: Once | INTRAMUSCULAR | Status: AC
  Administered 2016-03-05: 0.5 mL via INTRAMUSCULAR
  Filled 2016-03-05: qty 0.5

## 2016-03-05 MED ORDER — OXYCODONE-ACETAMINOPHEN 5-325 MG PO TABS: 1.0000 | ORAL_TABLET | ORAL | 0 refills | Status: DC | PRN

## 2016-03-05 NOTE — ED Notes (Signed)
States she was trying ot break up a playful encounter with neighbors dog and her's   The dog bit her on right lower leg  Bleeding controlled

## 2016-03-05 NOTE — ED Provider Notes (Signed)
[facility] Emergency Department Provider Note  ____________________________________________   First MD Initiated Contact with Patient 03/05/16 [phone]     (approximate)  I have reviewed the triage vital signs and the nursing notes.   HISTORY  Chief Complaint Animal Bite    HPI Caitlyn Roth is a 50 y.o. female is here with a dog bite. Patient states that this morning she was bitten by dog that is staying with them. Dog initially belonged to a friend who was unable to keep taking care of him. Patient and husband are false during the dog at home. Dog is up-to-date on immunizations. Patient was bit in the right lower leg. Patient states that this started with her dog growling at the other to basically be left alone. The other dog being a lab and Bertram [place] terrier mix is very hyper.Patient states she is uncertain about her immunizations and the last time she had a tetanus booster. She states that immediately after the dog bite she used some essential oils that stopped her wound from bleeding.   Past Medical History:  Diagnosis Date  . Asthma   . Graves disease   . Hyperlipemia   . Hypertension     There are no active problems to display for this patient.   Past Surgical History:  Procedure Laterality Date  . CARDIAC CATHETERIZATION    . cardiac stents    . CESAREAN SECTION    . CHOLECYSTECTOMY    . CORONARY ANGIOPLASTY    . KNEE ARTHROSCOPY    . SINUS SURGERY WITH INSTATRAK    . TUBAL LIGATION      Prior to Admission medications   Medication Sig Start Date End Date Taking? Authorizing Provider  amoxicillin-clavulanate (AUGMENTIN) 875-125 MG tablet Take 1 tablet by mouth 2 (two) times daily. 03/05/16 03/12/16  Tommi Rumps, PA-C  amphetamine-dextroamphetamine (ADDERALL XR) 30 MG 24 hr capsule Take 30 mg by mouth. 02/19/14   Historical Provider, MD  atorvastatin (LIPITOR) 80 MG tablet Take 1 tablet by mouth daily. 04/22/15   Historical  Provider, MD  BRILINTA 90 MG TABS tablet Take 90 mg by mouth every 12 (twelve) hours. 02/28/15   Historical Provider, MD  Calcium Carb-Ergocalciferol 500-200 MG-UNIT TABS Take by mouth.    Historical Provider, MD  clopidogrel (PLAVIX) 75 MG tablet Take by mouth. 03/16/15 03/15/16  Historical Provider, MD  desloratadine (CLARINEX) 5 MG tablet Take 5 mg by mouth.    Historical Provider, MD  DULERA 200-5 MCG/ACT AERO Inhale 2 puffs into the lungs 2 (two) times daily. 05/04/15   Historical Provider, MD  DUREZOL 0.05 % EMUL Place 1 drop into the right eye 2 (two) times daily. 05/18/15   Historical Provider, MD  ferrous sulfate 325 (65 FE) MG tablet Take 325 mg by mouth.    Historical Provider, MD  ILEVRO 0.3 % ophthalmic suspension Place 1 drop into the right eye daily. 05/01/15   Historical Provider, MD  lisinopril (PRINIVIL,ZESTRIL) 2.5 MG tablet Take 1 tablet by mouth daily. 04/22/15   Historical Provider, MD  methimazole (TAPAZOLE) 5 MG tablet Take one-half tablet by mouth daily 07/18/14   Historical Provider, MD  metoCLOPramide (REGLAN) 10 MG tablet Take 1 tablet (10 mg total) by mouth 4 (four) times daily -  before meals and at bedtime. 05/24/15   Sharman Cheek, MD  oxyCODONE-acetaminophen (PERCOCET) 5-325 MG tablet Take 1 tablet by mouth every 4 (four) hours as needed for severe pain. 03/05/16   Ian Cavey L  Levada Schilling, PA-C  ranitidine (ZANTAC) 150 MG capsule Take 1 capsule (150 mg total) by mouth 2 (two) times daily. 05/24/15   Sharman Cheek, MD  sucralfate (CARAFATE) 1 g tablet Take 1 tablet (1 g total) by mouth 4 (four) times daily. 05/24/15   Sharman Cheek, MD  venlafaxine XR (EFFEXOR-XR) 150 MG 24 hr capsule Take 150 mg by mouth daily with breakfast.    Historical Provider, MD    Allergies Lac bovis; Acyclovir; Cefdinir; Codeine; and Other  Family History  Problem Relation Age of Onset  . Breast cancer Maternal Aunt 51    Social History Social History  Substance Use Topics  . Smoking  status: Former Smoker    Packs/day: 1.00    Years: 35.00    Types: Cigarettes    Quit date: 01/27/2015  . Smokeless tobacco: Never Used  . Alcohol use Yes    Review of Systems Constitutional: No fever/chills Cardiovascular: Denies chest pain. Respiratory: Denies shortness of breath. Musculoskeletal: Right leg pain. Skin: Laceration right leg. Neurological: Negative for headaches, focal weakness or numbness.  10-point ROS otherwise negative.  ____________________________________________   PHYSICAL EXAM:  VITAL SIGNS: ED Triage Vitals [03/05/16 0807]  Enc Vitals Group     BP 120/90     Pulse Rate 82     Resp 18     Temp 98.3 F (36.8 C)     Temp Source Oral     SpO2 98 %     Weight 215 lb (97.5 kg)     Height 5\' 9"  (1.753 m)     Head Circumference      Peak Flow      Pain Score      Pain Loc      Pain Edu?      Excl. in GC?     Constitutional: Alert and oriented. Well appearing and in no acute distress. Eyes: Conjunctivae are normal. PERRL. EOMI. Head: Atraumatic. Nose: No congestion/rhinnorhea. Neck: No stridor.   Cardiovascular: Normal rate, regular rhythm. Grossly normal heart sounds.  Good peripheral circulation. Respiratory: Normal respiratory effort.  No retractions. Lungs CTAB. Musculoskeletal: Moves upper and lower extremities without any difficulty. On the right lower leg there is aBug bite to the right lower leg. There is no active bleeding at this time. Range of motion of the knee is without restriction. No foreign bodies were noted on visual exam. Neurologic:  Normal speech and language. No gross focal neurologic deficits are appreciated. No gait instability. Skin:  Skin is warm, dry and intact. No rash noted. Psychiatric: Mood and affect are normal. Speech and behavior are normal.  ____________________________________________   LABS (all labs ordered are listed, but only abnormal results are displayed)  Labs Reviewed - No data to  display   PROCEDURES  Procedure(s) performed: LACERATION REPAIR Performed by: Tommi Rumps Authorized by: Tommi Rumps Consent: Verbal consent obtained. Risks and benefits: risks, benefits and alternatives were discussed Consent given by: patient Patient identity confirmed: provided demographic data Prepped and Draped in normal sterile fashion Wound explored  Laceration Location: Right lower leg medial aspect.  Laceration Length: 4.0 cm  No Foreign Bodies seen or palpated  Anesthesia: local infiltration  Local anesthetic: lidocaine 1 % without epinephrine  Anesthetic total: 4 ml  Irrigation method: syringe Amount of cleaning: standard  Skin closure: 4-0 Ethilon   Number of sutures: 3   Technique: Simple interrupted   Patient tolerance: Patient tolerated the procedure well with no immediate complications.  Procedures  Critical Care performed: No  ____________________________________________   INITIAL IMPRESSION / ASSESSMENT AND PLAN / ED COURSE  Pertinent labs & imaging results that were available during my care of the patient were reviewed by me and considered in my medical decision making (see chart for details).    Clinical Course  Area was irrigated multiple times without foreign body and no continued bleeding. Patient tolerated procedure well. Patient was given a prescription for Augmentin and Percocet. Patient states that most likely she'll not get the antibiotic filled although we discussed high risk for infection with dog bites. Patient states that she believes in her essential oils and will use them instead of  the antibiotic.   ____________________________________________   FINAL CLINICAL IMPRESSION(S) / ED DIAGNOSES  Final diagnoses:  Dog bite of right lower leg, initial encounter      NEW MEDICATIONS STARTED DURING THIS VISIT:  Discharge Medication List as of 03/05/2016 10:21 AM    START taking these medications   Details   amoxicillin-clavulanate (AUGMENTIN) 875-125 MG tablet Take 1 tablet by mouth 2 (two) times daily., Starting Tue 03/05/2016, Until Tue 03/12/2016, Print    oxyCODONE-acetaminophen (PERCOCET) 5-325 MG tablet Take 1 tablet by mouth every 4 (four) hours as needed for severe pain., Starting Tue 03/05/2016, Print         Note:  This document was prepared using Dragon voice recognition software and may include unintentional dictation errors.    Tommi Rumpshonda L Pearle Wandler, PA-C 03/05/16 1424    Emily FilbertJonathan E Williams, MD 03/05/16 [phone]

## 2016-03-05 NOTE — ED Triage Notes (Signed)
Dog bite to leg

## 2016-03-05 NOTE — Discharge Instructions (Signed)
Begin taking antibiotic as directed. Percocet as needed for pain. Clean area daily with mild soap and water and watch for signs of infection. Return to emergency room if any fever or signs of infection.  WOUND CARE  Keep area clean and dry for 24 hours. Do not remove bandage, if applied.  After 24 hours, remove bandage and wash wound gently with mild soap and warm water. Reapply a new bandage after cleaning wound, if directed.  Continue daily cleansing with soap and water until stitches/staples are removed.  Do not apply any ointments or creams to the wound while stitches/staples are in place, as this may cause delayed healing.  Notify the office if you experience any of the following signs of infection: Swelling, redness, pus drainage, streaking, fever >101.0 F  Notify the office if you experience excessive bleeding that does not stop after 15-20 minutes of constant, firm pressure.

## 2016-07-15 ENCOUNTER — Other Ambulatory Visit: Payer: Self-pay | Admitting: Student

## 2016-07-15 DIAGNOSIS — M7521 Bicipital tendinitis, right shoulder: Secondary | ICD-10-CM

## 2016-07-19 ENCOUNTER — Other Ambulatory Visit: Payer: Self-pay | Admitting: Student

## 2016-07-19 DIAGNOSIS — M7521 Bicipital tendinitis, right shoulder: Secondary | ICD-10-CM

## 2016-08-02 DIAGNOSIS — M7521 Bicipital tendinitis, right shoulder: Secondary | ICD-10-CM | POA: Insufficient documentation

## 2016-08-02 DIAGNOSIS — M7581 Other shoulder lesions, right shoulder: Secondary | ICD-10-CM | POA: Insufficient documentation

## 2016-08-02 DIAGNOSIS — M75121 Complete rotator cuff tear or rupture of right shoulder, not specified as traumatic: Secondary | ICD-10-CM | POA: Insufficient documentation

## 2016-09-03 ENCOUNTER — Other Ambulatory Visit: Payer: BLUE CROSS/BLUE SHIELD

## 2016-09-05 ENCOUNTER — Ambulatory Visit: Admit: 2016-09-05 | Payer: BLUE CROSS/BLUE SHIELD | Admitting: Surgery

## 2016-09-05 SURGERY — ARTHROSCOPY, SHOULDER WITH REPAIR, ROTATOR CUFF, OPEN
Anesthesia: Choice | Laterality: Right

## 2016-09-12 ENCOUNTER — Other Ambulatory Visit: Payer: Self-pay

## 2016-09-12 DIAGNOSIS — E7800 Pure hypercholesterolemia, unspecified: Secondary | ICD-10-CM

## 2016-09-12 DIAGNOSIS — E559 Vitamin D deficiency, unspecified: Secondary | ICD-10-CM

## 2016-09-12 DIAGNOSIS — E0590 Thyrotoxicosis, unspecified without thyrotoxic crisis or storm: Secondary | ICD-10-CM

## 2016-09-12 DIAGNOSIS — E538 Deficiency of other specified B group vitamins: Secondary | ICD-10-CM

## 2016-09-12 DIAGNOSIS — R7301 Impaired fasting glucose: Secondary | ICD-10-CM

## 2016-09-13 LAB — IRON AND TIBC
Iron Saturation: 16 % (ref 15–55)
Iron: 59 ug/dL (ref 27–159)
Total Iron Binding Capacity: 368 ug/dL (ref 250–450)
UIBC: 309 ug/dL (ref 131–425)

## 2016-09-13 LAB — LIPID PANEL
CHOLESTEROL TOTAL: 155 mg/dL (ref 100–199)
Chol/HDL Ratio: 5.5 ratio — ABNORMAL HIGH (ref 0.0–4.4)
HDL: 28 mg/dL — ABNORMAL LOW (ref >39)
LDL Calculated: 84 mg/dL (ref 0–99)
TRIGLYCERIDES: 215 mg/dL — AB (ref 0–149)
VLDL CHOLESTEROL CAL: 43 mg/dL — AB (ref 5–40)

## 2016-09-13 LAB — VITAMIN D 25 HYDROXY (VIT D DEFICIENCY, FRACTURES): Vit D, 25-Hydroxy: 41 ng/mL (ref 30.0–100.0)

## 2016-09-13 LAB — TSH: TSH: 0.973 u[IU]/mL (ref 0.450–4.500)

## 2016-09-13 LAB — CREATININE, SERUM
CREATININE: 0.81 mg/dL (ref 0.57–1.00)
GFR calc Af Amer: 97 mL/min/{1.73_m2} (ref >59)
GFR, EST NON AFRICAN AMERICAN: 84 mL/min/{1.73_m2} (ref >59)

## 2016-09-13 LAB — B12 AND FOLATE PANEL
FOLATE: 5.5 ng/mL (ref >3.0)
Vitamin B-12: 637 pg/mL (ref 232–1245)

## 2016-09-13 LAB — HEPATIC FUNCTION PANEL
ALBUMIN: 4.2 g/dL (ref 3.5–5.5)
ALK PHOS: 141 IU/L — AB (ref 39–117)
ALT: 25 IU/L (ref 0–32)
AST: 11 IU/L (ref 0–40)
Bilirubin Total: 0.4 mg/dL (ref 0.0–1.2)
Bilirubin, Direct: 0.1 mg/dL (ref 0.00–0.40)
TOTAL PROTEIN: 6.7 g/dL (ref 6.0–8.5)

## 2016-09-13 LAB — HGB A1C W/O EAG: Hgb A1c MFr Bld: 6.6 % — ABNORMAL HIGH (ref 4.8–5.6)

## 2016-09-15 IMAGING — CR DG CHEST 1V PORT
1 series · 1 of 1 positions shown · non-contrast
Comparison: 05/13/2012

CLINICAL DATA: Chest pain

EXAM:
PORTABLE CHEST - 1 VIEW

[ap]
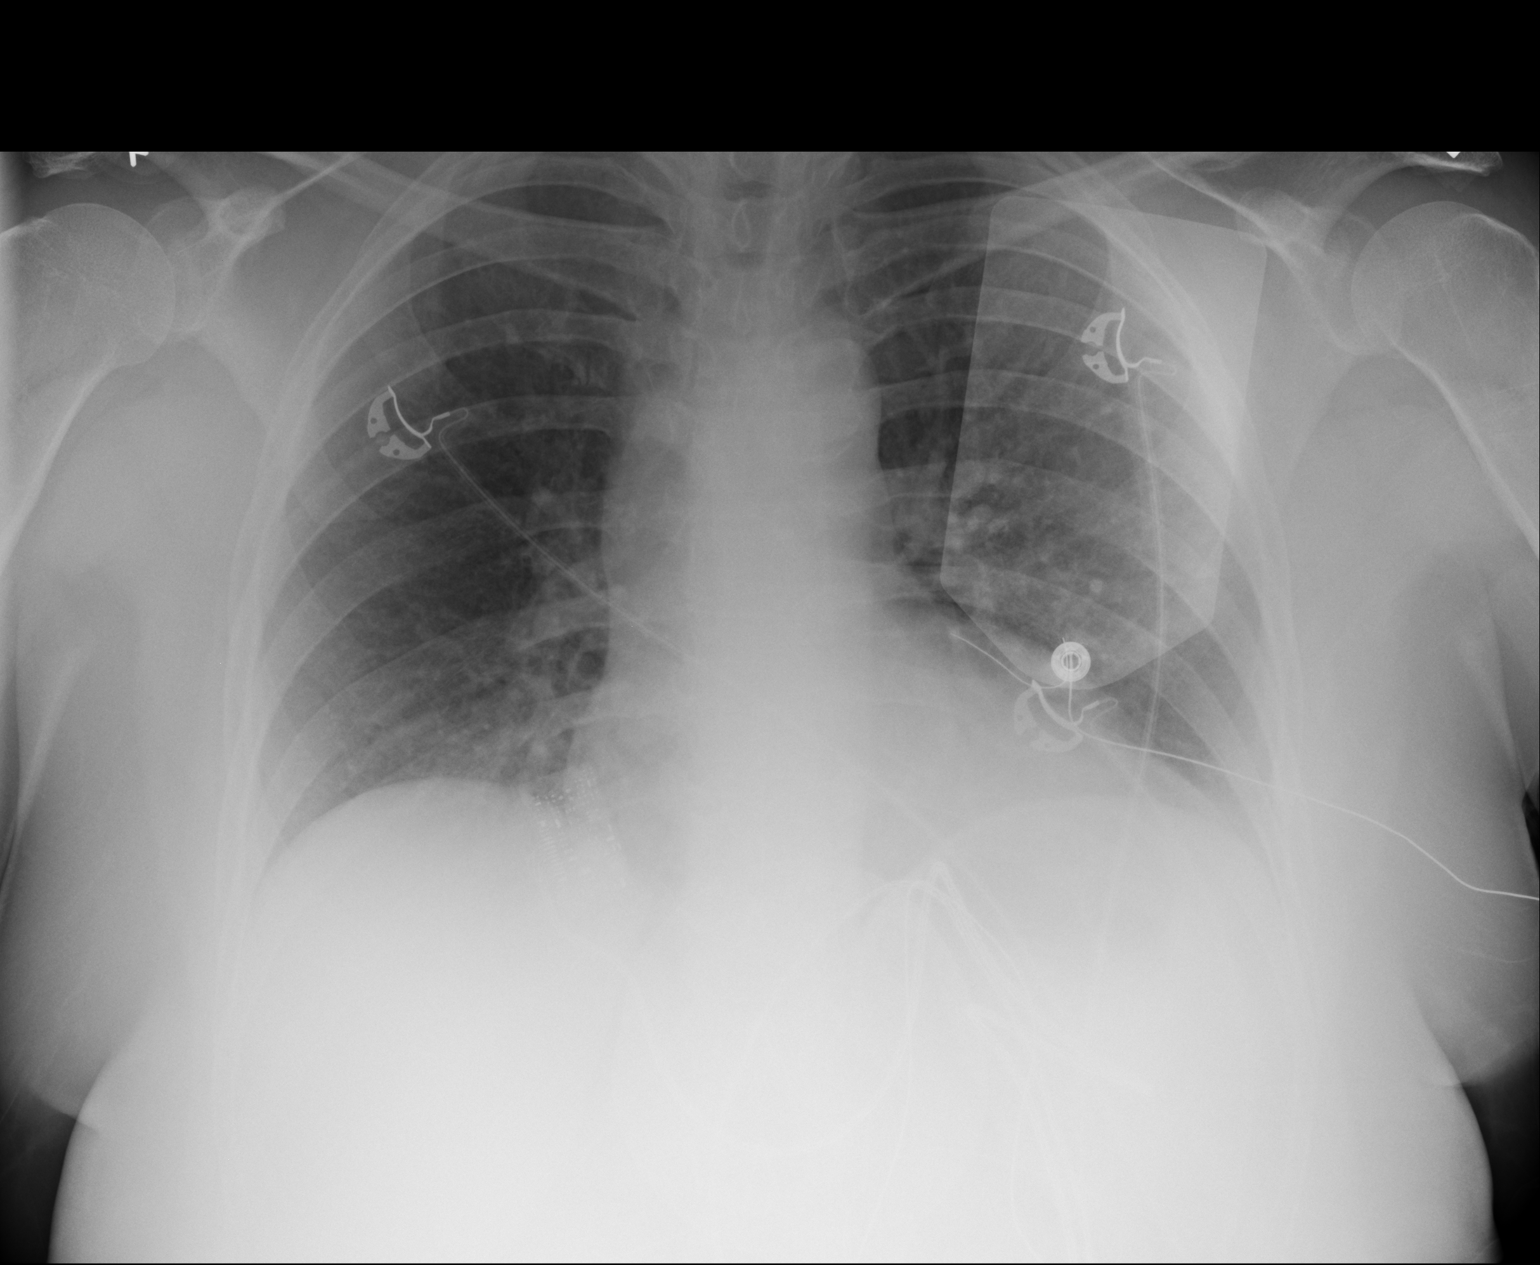

[1 of 1 positions shown; findings below may reference images not displayed]

FINDINGS: The heart size and mediastinal contours are within normal limits.
Both lungs are clear but hypo aerated with crowding of the
bronchovascular markings. The visualized skeletal structures are
unremarkable. Pacing pads overlie the chest.
IMPRESSION: No active disease.

## 2016-09-16 ENCOUNTER — Encounter
Admission: RE | Admit: 2016-09-16 | Discharge: 2016-09-16 | Disposition: A | Discharge status: Home / Self Care | Payer: BLUE CROSS/BLUE SHIELD | Source: Ambulatory Visit | Attending: Surgery | Admitting: Surgery

## 2016-09-16 DIAGNOSIS — Z01812 Encounter for preprocedural laboratory examination: Secondary | ICD-10-CM | POA: Diagnosis present

## 2016-09-16 HISTORY — DX: Prediabetes: R73.03

## 2016-09-16 HISTORY — DX: Anxiety disorder, unspecified: F41.9

## 2016-09-16 HISTORY — DX: Cerebral infarction, unspecified: I63.9

## 2016-09-16 HISTORY — DX: Gastro-esophageal reflux disease without esophagitis: K21.9

## 2016-09-16 HISTORY — DX: Family history of other specified conditions: Z84.89

## 2016-09-16 HISTORY — DX: Acute myocardial infarction, unspecified: I21.9

## 2016-09-16 NOTE — Patient Instructions (Signed)
  Your procedure is scheduled GM:WNUUVOZ May 15 , 2018. Report to Same Day Surgery. To find out your arrival time please call [phone] between 1PM - 3PM on Monday Sep 30, 2016.  Remember: Instructions that are not followed completely may result in serious medical risk, up to and including death, or upon the discretion of your surgeon and anesthesiologist your surgery may need to be rescheduled.    _x___ 1. Do not eat food or drink liquids after midnight. No gum chewing or hard candies.     _x___ 2. No Alcohol for 24 hours before or after surgery.   ____ 3. Bring all medications with you on the day of surgery if instructed.    __x__ 4. Notify your doctor if there is any change in your medical condition     (cold, fever, infections).    __x___ 5. No smoking 24 hours prior to surgery.     Do not wear jewelry, make-up, hairpins, clips or nail polish.  Do not wear lotions, powders, or perfumes.   Do not shave 48 hours prior to surgery. Men may shave face and neck.  Do not bring valuables to the hospital.    [facility] is not responsible for any belongings or valuables.               Contacts, dentures or bridgework may not be worn into surgery.  Leave your suitcase in the car. After surgery it may be brought to your room.  For patients admitted to the hospital, discharge time is determined by your treatment team.   Patients discharged the day of surgery will not be allowed to drive home.    Please read over the following fact sheets that you were given:   [facility] Preparing for Surgery  ____ Take these medicines the morning of surgery with A SIP OF WATER:    1. amphetamine-dextroamphetamine (ADDERALL XR)   2. venlafaxine XR (EFFEXOR-XR)   ____ Fleet Enema (as directed)   _x___ Use CHG Soap as directed on instruction sheet  _x___ Use inhalers on the day of surgery and bring to hospital day of surgery  ____ Stop metformin 2 days prior to surgery    ____ Take 1/2 of  usual insulin dose the night before surgery and none on the morning of  surgery.   _x__ Stop Plavix and aspirin as instructed by cardiologist.  _x___ Stop Anti-inflammatories such as Advil, Aleve, Ibuprofen, Motrin, Naproxen, Naprosyn, Goodies powders or aspirin products. OK to take Tylenol.   ____ Stop supplements until after surgery.    ____ Bring C-Pap to the hospital.

## 2016-09-16 NOTE — Pre-Procedure Instructions (Signed)
Dr. Lady Gary clearance not in front of chart.

## 2016-09-16 NOTE — Pre-Procedure Instructions (Signed)
Pt reports she gets very sick when she talks Cefdinir, nausea, vomiting and swelling of the throat. Left message on Dr. Binnie Rail nurses' voicemail regarding above allergy and does he want to continue with the Ancef or order something different.

## 2016-09-17 NOTE — Pre-Procedure Instructions (Signed)
Spoke with Ladona Ridgel at Dr. Binnie Rail office regarding Ancef order and  Cefdinir IVPB pre-op antibiotic order, she will discuss with Horris Latino and let me know how to proceed.

## 2016-09-17 NOTE — Pre-Procedure Instructions (Signed)
1615 Received fax from [facility] order for Vancomycin 1 gram IVPB .

## 2016-09-17 NOTE — Pre-Procedure Instructions (Signed)
Blood sent to the lab was rejected due to mislabeling.  Spoke with M. Machia RN, redraw these labs am of surgery.

## 2016-09-18 NOTE — Pre-Procedure Instructions (Signed)
  ECG 12-lead3/22/2018 [facility] Component Name Value Ref Range  Vent Rate (bpm) 89   PR Interval (msec) 156   QRS Interval (msec) 94   QT Interval (msec) 366   QTc (msec) 445   Result Narrative  Normal sinus rhythm Normal ECG When compared with ECG of 24-Aug-2015 10:08, No significant change was found I reviewed and concur with this report. Electronically signed UG:QBVQ MD, KEN [phone]) on 08/14/2016 8:40:19 AM     NM myocardial perfusion SPECT multiple (stress and rest)08/21/2016 [facility] Result Impression  Negative ETT.LV function normal.No evidence of ischemia.  Result Narrative  CARDIOLOGY DEPARTMENT [facility] [address] [place], [id number] [phone]  Procedure: Exercise Myocardial Perfusion Imaging ONE day procedure  Indication: Arteriosclerotic coronary artery disease Plan: NM myocardial perfusion SPECT multiple (stress  and rest), ECG stress test only  Ordering Physician:   Dr. Harold Hedge   Clinical History: 51 y.o. year old female Vitals: Height: 69 inWeight: 217 lb Cardiac risk factors include:  Smoking, Hyperlipidemia, Previous MI, HTN and Obesity    Procedure: The patient performed treadmill exercise using a Bruce protocol for 7:00  minutes. The exercise test was stopped due to fatigue.Blood pressure  response was hypertensive. The patient developed symptoms other than  fatigue during the procedure; specific symptoms included SEVERE SOB.  Rest HR: 74bpm Rest BP: 126/93mmHg Max HR: 157bpm Max BP: 222/72mmHg Mets: 10.10 % MAX HR: 92%  Stress Test Administered by: Percell Boston, CMA  ECG Interpretation: Rest VWP:VXYIAX sinus rhythm, none Stress KPV:VZSMO tachycardia, no ischemia Recovery LMB:EMLJQG sinus rhythm ECG Interpretation:negative, nondiagnostic changes.   Administrations This Visit  technetium Tc21m  sestamibi (CARDIOLITE) injection 29.71 millicurie  Admin Date 08/21/2016 Action Given Dose 29.71 millicurie Route Intravenous Administered By Titus Dubin, CNMT     technetium Tc72m sestamibi (CARDIOLITE) injection 9.89 millicurie  Admin Date 08/21/2016 Action Given Dose 9.89 millicurie Route Intravenous Administered By Dagoberto Reef, CNMT       Gated post-stress perfusion imaging was performed 30 minutes after stress.  Rest images were performed 30 minutes after injection.  Gated LV Analysis:  TID:1.02  LVEF= 57%  FINDINGS: Regional wall motion:reveals normal myocardial thickening and wall  motion. The overall quality of the study is excellent. Artifacts noted: no Left ventricular cavity: normal.  Perfusion Analysis:SPECT images demonstrate homogeneous tracer  distribution throughout the myocardium.  Status Results Details

## 2016-10-01 ENCOUNTER — Ambulatory Visit: Payer: BLUE CROSS/BLUE SHIELD | Admitting: Anesthesiology

## 2016-10-01 ENCOUNTER — Ambulatory Visit
Admission: RE | Admit: 2016-10-01 | Discharge: 2016-10-01 | Disposition: A | Discharge status: Home / Self Care | Payer: BLUE CROSS/BLUE SHIELD | Source: Ambulatory Visit | Attending: Surgery | Admitting: Surgery

## 2016-10-01 ENCOUNTER — Encounter
Admission: RE | Disposition: A | Discharge status: Home / Self Care | Payer: Self-pay | Source: Ambulatory Visit | Attending: Surgery

## 2016-10-01 DIAGNOSIS — M75101 Unspecified rotator cuff tear or rupture of right shoulder, not specified as traumatic: Secondary | ICD-10-CM | POA: Insufficient documentation

## 2016-10-01 DIAGNOSIS — Z79899 Other long term (current) drug therapy: Secondary | ICD-10-CM | POA: Diagnosis not present

## 2016-10-01 DIAGNOSIS — E119 Type 2 diabetes mellitus without complications: Secondary | ICD-10-CM | POA: Insufficient documentation

## 2016-10-01 DIAGNOSIS — Z833 Family history of diabetes mellitus: Secondary | ICD-10-CM | POA: Diagnosis not present

## 2016-10-01 DIAGNOSIS — F17200 Nicotine dependence, unspecified, uncomplicated: Secondary | ICD-10-CM | POA: Diagnosis not present

## 2016-10-01 DIAGNOSIS — M1990 Unspecified osteoarthritis, unspecified site: Secondary | ICD-10-CM | POA: Diagnosis not present

## 2016-10-01 DIAGNOSIS — Z885 Allergy status to narcotic agent status: Secondary | ICD-10-CM | POA: Insufficient documentation

## 2016-10-01 DIAGNOSIS — Z91011 Allergy to milk products: Secondary | ICD-10-CM | POA: Diagnosis not present

## 2016-10-01 DIAGNOSIS — Z888 Allergy status to other drugs, medicaments and biological substances status: Secondary | ICD-10-CM | POA: Diagnosis not present

## 2016-10-01 DIAGNOSIS — M7581 Other shoulder lesions, right shoulder: Secondary | ICD-10-CM | POA: Insufficient documentation

## 2016-10-01 DIAGNOSIS — I2510 Atherosclerotic heart disease of native coronary artery without angina pectoris: Secondary | ICD-10-CM | POA: Insufficient documentation

## 2016-10-01 DIAGNOSIS — Z820 Family history of epilepsy and other diseases of the nervous system: Secondary | ICD-10-CM | POA: Diagnosis not present

## 2016-10-01 DIAGNOSIS — E785 Hyperlipidemia, unspecified: Secondary | ICD-10-CM | POA: Insufficient documentation

## 2016-10-01 DIAGNOSIS — I69312 Visuospatial deficit and spatial neglect following cerebral infarction: Secondary | ICD-10-CM | POA: Diagnosis not present

## 2016-10-01 DIAGNOSIS — E7800 Pure hypercholesterolemia, unspecified: Secondary | ICD-10-CM | POA: Diagnosis not present

## 2016-10-01 DIAGNOSIS — Z83511 Family history of glaucoma: Secondary | ICD-10-CM | POA: Insufficient documentation

## 2016-10-01 DIAGNOSIS — M7541 Impingement syndrome of right shoulder: Secondary | ICD-10-CM | POA: Diagnosis present

## 2016-10-01 DIAGNOSIS — I252 Old myocardial infarction: Secondary | ICD-10-CM | POA: Diagnosis not present

## 2016-10-01 DIAGNOSIS — Z955 Presence of coronary angioplasty implant and graft: Secondary | ICD-10-CM | POA: Insufficient documentation

## 2016-10-01 DIAGNOSIS — Z9049 Acquired absence of other specified parts of digestive tract: Secondary | ICD-10-CM | POA: Diagnosis not present

## 2016-10-01 DIAGNOSIS — K219 Gastro-esophageal reflux disease without esophagitis: Secondary | ICD-10-CM | POA: Insufficient documentation

## 2016-10-01 DIAGNOSIS — G43909 Migraine, unspecified, not intractable, without status migrainosus: Secondary | ICD-10-CM | POA: Insufficient documentation

## 2016-10-01 DIAGNOSIS — H1851 Endothelial corneal dystrophy: Secondary | ICD-10-CM | POA: Diagnosis not present

## 2016-10-01 DIAGNOSIS — Z7902 Long term (current) use of antithrombotics/antiplatelets: Secondary | ICD-10-CM | POA: Diagnosis not present

## 2016-10-01 DIAGNOSIS — E0590 Thyrotoxicosis, unspecified without thyrotoxic crisis or storm: Secondary | ICD-10-CM | POA: Insufficient documentation

## 2016-10-01 HISTORY — PX: SHOULDER ARTHROSCOPY WITH OPEN ROTATOR CUFF REPAIR: SHX6092

## 2016-10-01 LAB — GLUCOSE, CAPILLARY
GLUCOSE-CAPILLARY: 118 mg/dL — AB (ref 65–99)
GLUCOSE-CAPILLARY: 133 mg/dL — AB (ref 65–99)

## 2016-10-01 LAB — POCT PREGNANCY, URINE: PREG TEST UR: NEGATIVE

## 2016-10-01 SURGERY — ARTHROSCOPY, SHOULDER WITH REPAIR, ROTATOR CUFF, OPEN
Anesthesia: Regional | Site: Shoulder | Laterality: Right | Wound class: Clean

## 2016-10-01 MED ORDER — ROPIVACAINE HCL 5 MG/ML IJ SOLN
INTRAMUSCULAR | Status: AC
  Filled 2016-10-01: qty 30

## 2016-10-01 MED ORDER — OXYCODONE HCL 5 MG PO TABS: 5.0000 mg | ORAL_TABLET | ORAL | Status: DC | PRN

## 2016-10-01 MED ORDER — ROPIVACAINE HCL 5 MG/ML IJ SOLN
INTRAMUSCULAR | Status: DC | PRN
  Administered 2016-10-01: 30 mL via PERINEURAL

## 2016-10-01 MED ORDER — LIDOCAINE HCL (PF) 1 % IJ SOLN
INTRAMUSCULAR | Status: DC | PRN
  Administered 2016-10-01: 5 mL via SUBCUTANEOUS

## 2016-10-01 MED ORDER — ROCURONIUM BROMIDE 50 MG/5ML IV SOLN
INTRAVENOUS | Status: AC
  Filled 2016-10-01: qty 1

## 2016-10-01 MED ORDER — PHENYLEPHRINE HCL 10 MG/ML IJ SOLN
INTRAMUSCULAR | Status: DC | PRN
  Administered 2016-10-01: 25 ug/min via INTRAVENOUS

## 2016-10-01 MED ORDER — BUPIVACAINE-EPINEPHRINE 0.5% -1:200000 IJ SOLN
INTRAMUSCULAR | Status: DC | PRN
  Administered 2016-10-01: 30 mL

## 2016-10-01 MED ORDER — MIDAZOLAM HCL 2 MG/2ML IJ SOLN
INTRAMUSCULAR | Status: AC
  Filled 2016-10-01: qty 2

## 2016-10-01 MED ORDER — CLINDAMYCIN PHOSPHATE 900 MG/50ML IV SOLN
900.0000 mg | Freq: Once | INTRAVENOUS | Status: AC
  Administered 2016-10-01: 900 mg via INTRAVENOUS

## 2016-10-01 MED ORDER — LACTATED RINGERS IV SOLN
INTRAVENOUS | Status: DC
  Administered 2016-10-01 (×2): via INTRAVENOUS

## 2016-10-01 MED ORDER — ONDANSETRON HCL 4 MG/2ML IJ SOLN
INTRAMUSCULAR | Status: DC | PRN
  Administered 2016-10-01: 4 mg via INTRAVENOUS

## 2016-10-01 MED ORDER — FENTANYL CITRATE (PF) 100 MCG/2ML IJ SOLN
50.0000 ug | Freq: Once | INTRAMUSCULAR | Status: AC
  Administered 2016-10-01: 50 ug via INTRAVENOUS

## 2016-10-01 MED ORDER — POTASSIUM CHLORIDE IN NACL 20-0.9 MEQ/L-% IV SOLN
INTRAVENOUS | Status: DC
Start: 2016-10-01 — End: 2016-10-01

## 2016-10-01 MED ORDER — LIDOCAINE HCL (PF) 2 % IJ SOLN
INTRAMUSCULAR | Status: AC
  Filled 2016-10-01: qty 2

## 2016-10-01 MED ORDER — FAMOTIDINE 20 MG PO TABS
ORAL_TABLET | ORAL | Status: AC
  Administered 2016-10-01: 20 mg via ORAL
  Filled 2016-10-01: qty 1

## 2016-10-01 MED ORDER — LACTATED RINGERS IV SOLN
INTRAVENOUS | Status: DC | PRN
  Administered 2016-10-01: 15:00:00

## 2016-10-01 MED ORDER — METOCLOPRAMIDE HCL 10 MG PO TABS: 5.0000 mg | ORAL_TABLET | Freq: Three times a day (TID) | ORAL | Status: DC | PRN

## 2016-10-01 MED ORDER — OXYCODONE HCL 5 MG PO TABS: 5.0000 mg | ORAL_TABLET | ORAL | 0 refills | Status: DC | PRN

## 2016-10-01 MED ORDER — PROPOFOL 10 MG/ML IV BOLUS
INTRAVENOUS | Status: DC | PRN
  Administered 2016-10-01: 150 mg via INTRAVENOUS

## 2016-10-01 MED ORDER — ONDANSETRON HCL 4 MG/2ML IJ SOLN: 4.0000 mg | Freq: Four times a day (QID) | INTRAMUSCULAR | Status: DC | PRN

## 2016-10-01 MED ORDER — MIDAZOLAM HCL 2 MG/2ML IJ SOLN
1.0000 mg | Freq: Once | INTRAMUSCULAR | Status: AC
  Administered 2016-10-01: 1 mg via INTRAVENOUS

## 2016-10-01 MED ORDER — BUPIVACAINE-EPINEPHRINE (PF) 0.5% -1:200000 IJ SOLN
INTRAMUSCULAR | Status: AC
  Filled 2016-10-01: qty 30

## 2016-10-01 MED ORDER — MIDAZOLAM HCL 2 MG/2ML IJ SOLN
INTRAMUSCULAR | Status: DC | PRN
  Administered 2016-10-01: 2 mg via INTRAVENOUS

## 2016-10-01 MED ORDER — FENTANYL CITRATE (PF) 100 MCG/2ML IJ SOLN: 25.0000 ug | INTRAMUSCULAR | Status: DC | PRN

## 2016-10-01 MED ORDER — PROPOFOL 500 MG/50ML IV EMUL
INTRAVENOUS | Status: AC
  Filled 2016-10-01: qty 50

## 2016-10-01 MED ORDER — METOCLOPRAMIDE HCL 5 MG/ML IJ SOLN: 5.0000 mg | Freq: Three times a day (TID) | INTRAMUSCULAR | Status: DC | PRN

## 2016-10-01 MED ORDER — SUGAMMADEX SODIUM 200 MG/2ML IV SOLN
INTRAVENOUS | Status: DC | PRN
  Administered 2016-10-01: 200 mg via INTRAVENOUS

## 2016-10-01 MED ORDER — MIDAZOLAM HCL 2 MG/2ML IJ SOLN
INTRAMUSCULAR | Status: AC
  Administered 2016-10-01: 1 mg via INTRAVENOUS
  Filled 2016-10-01: qty 2

## 2016-10-01 MED ORDER — LIDOCAINE HCL (CARDIAC) 20 MG/ML IV SOLN
INTRAVENOUS | Status: DC | PRN
Start: 2016-10-01 — End: 2016-10-01
  Administered 2016-10-01: 100 mg via INTRAVENOUS

## 2016-10-01 MED ORDER — LIDOCAINE HCL (PF) 1 % IJ SOLN
INTRAMUSCULAR | Status: AC
  Filled 2016-10-01: qty 5

## 2016-10-01 MED ORDER — ONDANSETRON HCL 4 MG/2ML IJ SOLN
INTRAMUSCULAR | Status: AC
  Filled 2016-10-01: qty 2

## 2016-10-01 MED ORDER — FENTANYL CITRATE (PF) 250 MCG/5ML IJ SOLN
INTRAMUSCULAR | Status: AC
  Filled 2016-10-01: qty 5

## 2016-10-01 MED ORDER — FENTANYL CITRATE (PF) 100 MCG/2ML IJ SOLN
INTRAMUSCULAR | Status: DC | PRN
  Administered 2016-10-01: 50 ug via INTRAVENOUS

## 2016-10-01 MED ORDER — EPINEPHRINE PF 1 MG/ML IJ SOLN
INTRAMUSCULAR | Status: AC
  Filled 2016-10-01: qty 2

## 2016-10-01 MED ORDER — VANCOMYCIN HCL IN DEXTROSE 1-5 GM/200ML-% IV SOLN: 1000.0000 mg | Freq: Once | INTRAVENOUS | Status: DC

## 2016-10-01 MED ORDER — CLINDAMYCIN PHOSPHATE 900 MG/50ML IV SOLN
INTRAVENOUS | Status: AC
  Filled 2016-10-01: qty 50

## 2016-10-01 MED ORDER — DEXAMETHASONE SODIUM PHOSPHATE 4 MG/ML IJ SOLN
INTRAMUSCULAR | Status: DC | PRN
  Administered 2016-10-01: 10 mg via INTRAVENOUS

## 2016-10-01 MED ORDER — ONDANSETRON HCL 4 MG PO TABS: 4.0000 mg | ORAL_TABLET | Freq: Four times a day (QID) | ORAL | Status: DC | PRN

## 2016-10-01 MED ORDER — PROMETHAZINE HCL 25 MG/ML IJ SOLN: 6.2500 mg | INTRAMUSCULAR | Status: DC | PRN

## 2016-10-01 MED ORDER — SUGAMMADEX SODIUM 200 MG/2ML IV SOLN
INTRAVENOUS | Status: AC
  Filled 2016-10-01: qty 2

## 2016-10-01 MED ORDER — FAMOTIDINE 20 MG PO TABS
20.0000 mg | ORAL_TABLET | Freq: Once | ORAL | Status: AC
Start: 2016-10-01 — End: 2016-10-01
  Administered 2016-10-01: 20 mg via ORAL

## 2016-10-01 MED ORDER — ROCURONIUM BROMIDE 100 MG/10ML IV SOLN
INTRAVENOUS | Status: DC | PRN
  Administered 2016-10-01: 50 mg via INTRAVENOUS

## 2016-10-01 MED ORDER — DEXAMETHASONE SODIUM PHOSPHATE 10 MG/ML IJ SOLN
INTRAMUSCULAR | Status: AC
  Filled 2016-10-01: qty 1

## 2016-10-01 MED ORDER — PHENYLEPHRINE HCL 10 MG/ML IJ SOLN
INTRAMUSCULAR | Status: DC | PRN
  Administered 2016-10-01: 100 ug via INTRAVENOUS

## 2016-10-01 MED ORDER — FENTANYL CITRATE (PF) 100 MCG/2ML IJ SOLN
INTRAMUSCULAR | Status: AC
  Administered 2016-10-01: 50 ug via INTRAVENOUS
  Filled 2016-10-01: qty 2

## 2016-10-01 SURGICAL SUPPLY — 49 items
ANCHOR JUGGERKNOT WTAP NDL 2.9 (Anchor) ×12 IMPLANT
ANCHOR SUT QUATTRO KNTLS 4.5 (Anchor) ×6 IMPLANT
BIT DRILL JUGRKNT W/NDL BIT2.9 (DRILL) ×1 IMPLANT
BLADE FULL RADIUS 3.5 (BLADE) ×3 IMPLANT
BNDG COHESIVE 4X5 TAN STRL (GAUZE/BANDAGES/DRESSINGS) ×3 IMPLANT
BUR ACROMIONIZER 4.0 (BURR) ×3 IMPLANT
CANNULA SHAVER 8MMX76MM (CANNULA) ×3 IMPLANT
CHLORAPREP W/TINT 26ML (MISCELLANEOUS) ×3 IMPLANT
COVER MAYO STAND STRL (DRAPES) ×3 IMPLANT
DRAPE IMP U-DRAPE 54X76 (DRAPES) ×6 IMPLANT
DRILL JUGGERKNOT W/NDL BIT 2.9 (DRILL) ×3
DRSG OPSITE POSTOP 4X8 (GAUZE/BANDAGES/DRESSINGS) ×3 IMPLANT
ELECT REM PT RETURN 9FT ADLT (ELECTROSURGICAL) ×3
ELECTRODE REM PT RTRN 9FT ADLT (ELECTROSURGICAL) ×1 IMPLANT
GAUZE PETRO XEROFOAM 1X8 (MISCELLANEOUS) ×3 IMPLANT
GAUZE SPONGE 4X4 12PLY STRL (GAUZE/BANDAGES/DRESSINGS) ×3 IMPLANT
GAUZE XEROFORM 4X4 STRL (GAUZE/BANDAGES/DRESSINGS) ×3 IMPLANT
GLOVE BIO SURGEON STRL SZ7.5 (GLOVE) ×6 IMPLANT
GLOVE BIO SURGEON STRL SZ8 (GLOVE) ×6 IMPLANT
GLOVE BIOGEL PI IND STRL 8 (GLOVE) ×1 IMPLANT
GLOVE BIOGEL PI INDICATOR 8 (GLOVE) ×2
GLOVE INDICATOR 8.0 STRL GRN (GLOVE) ×3 IMPLANT
GOWN STRL REUS W/ TWL LRG LVL3 (GOWN DISPOSABLE) ×1 IMPLANT
GOWN STRL REUS W/ TWL XL LVL3 (GOWN DISPOSABLE) ×1 IMPLANT
GOWN STRL REUS W/TWL LRG LVL3 (GOWN DISPOSABLE) ×2
GOWN STRL REUS W/TWL XL LVL3 (GOWN DISPOSABLE) ×2
GRASPER SUT 15 45D LOW PRO (SUTURE) IMPLANT
IV LACTATED RINGER IRRG 3000ML (IV SOLUTION) ×4
IV LR IRRIG 3000ML ARTHROMATIC (IV SOLUTION) ×2 IMPLANT
MANIFOLD NEPTUNE II (INSTRUMENTS) ×3 IMPLANT
MASK FACE SPIDER DISP (MASK) ×3 IMPLANT
MAT BLUE FLOOR 46X72 FLO (MISCELLANEOUS) ×3 IMPLANT
NDL MAYO CATGUT SZ5 (NEEDLE)
NDL SUT 5 .5 CRC TPR PNT MAYO (NEEDLE) IMPLANT
NEEDLE REVERSE CUT 1/2 CRC (NEEDLE) IMPLANT
PACK ARTHROSCOPY SHOULDER (MISCELLANEOUS) ×3 IMPLANT
PAD ABD DERMACEA PRESS 5X9 (GAUZE/BANDAGES/DRESSINGS) ×3 IMPLANT
SLING ARM LRG DEEP (SOFTGOODS) IMPLANT
SLING ULTRA II LG (MISCELLANEOUS) ×3 IMPLANT
STAPLER SKIN PROX 35W (STAPLE) ×3 IMPLANT
STRAP SAFETY BODY (MISCELLANEOUS) ×3 IMPLANT
SUT ETHIBOND 0 MO6 C/R (SUTURE) ×3 IMPLANT
SUT VIC AB 2-0 CT1 27 (SUTURE) ×4
SUT VIC AB 2-0 CT1 TAPERPNT 27 (SUTURE) ×2 IMPLANT
TAPE MICROFOAM 4IN (TAPE) ×3 IMPLANT
TUBING ARTHRO INFLOW-ONLY STRL (TUBING) ×3 IMPLANT
TUBING CONNECTING 10 (TUBING) ×2 IMPLANT
TUBING CONNECTING 10' (TUBING) ×1
WAND HAND CNTRL MULTIVAC 90 (MISCELLANEOUS) ×3 IMPLANT

## 2016-10-01 NOTE — OR Nursing (Signed)
Transported to PACU via stretcher for block. 

## 2016-10-01 NOTE — OR Nursing (Signed)
Clarified antibiotic orders with Dr Joice Lofts.  Patient denies any hx of MRSA.

## 2016-10-01 NOTE — Anesthesia Procedure Notes (Signed)
Anesthesia Regional Block: Interscalene brachial plexus block   Pre-Anesthetic Checklist: ,, timeout performed, Correct Patient, Correct Site, Correct Laterality, Correct Procedure, Correct Position, site marked, Risks and benefits discussed,  Surgical consent,  Pre-op evaluation,  At surgeon's request and post-op pain management  Laterality: Right and Upper  Prep: chloraprep       Needles:  Injection technique: Single-shot  Needle Type: Stimiplex     Needle Length: 10cm  Needle Gauge: 22     Additional Needles:   Procedures: ultrasound guided,,,,,,,,  Narrative:  Start time: 10/01/2016 1:52 PM End time: 10/01/2016 1:57 PM Injection made incrementally with aspirations every 5 mL.  Performed by: Personally  Anesthesiologist: Lenard Simmer  Additional Notes: Functioning IV was confirmed and monitors were applied.  A 67mm 22ga Stimuplex needle was used. Sterile prep and drape,hand hygiene and sterile gloves were used.  Negative aspiration and negative test dose prior to incremental administration of local anesthetic. The patient tolerated the procedure well.

## 2016-10-01 NOTE — Anesthesia Procedure Notes (Signed)
Procedure Name: Intubation Date/Time: 10/01/2016 2:31 PM Performed by: Rosaria Ferries, Kouper Spinella Pre-anesthesia Checklist: Patient identified, Emergency Drugs available, Suction available and Patient being monitored Patient Re-evaluated:Patient Re-evaluated prior to inductionOxygen Delivery Method: Circle system utilized Preoxygenation: Pre-oxygenation with 100% oxygen Intubation Type: IV induction Laryngoscope Size: Mac and 3 Grade View: Grade II Tube size: 7.0 mm Placement Confirmation: ETT inserted through vocal cords under direct vision,  positive ETCO2 and breath sounds checked- equal and bilateral Secured at: 22 cm Tube secured with: Tape Dental Injury: Teeth and Oropharynx as per pre-operative assessment

## 2016-10-01 NOTE — Anesthesia Preprocedure Evaluation (Signed)
Anesthesia Evaluation  Patient identified by MRN, date of birth, ID band Patient awake    Reviewed: Allergy & Precautions, H&P , NPO status , Patient's Chart, lab work & pertinent test results, reviewed documented beta blocker date and time   History of Anesthesia Complications Negative for: history of anesthetic complications  Airway Mallampati: II  TM Distance: >3 FB Neck ROM: full    Dental  (+) Implants, Missing, Teeth Intact, Dental Advidsory Given   Pulmonary neg shortness of breath, asthma , neg sleep apnea, neg COPD, neg recent URI, Current Smoker,           Cardiovascular Exercise Tolerance: Good hypertension, (-) angina+ CAD, + Past MI and + Cardiac Stents  (-) CABG negative cardio ROS  + dysrhythmias (-) Valvular Problems/Murmurs     Neuro/Psych neg Seizures CVA negative psych ROS   GI/Hepatic Neg liver ROS, GERD  ,  Endo/Other  diabetes (Borderline)Hyperthyroidism   Renal/GU negative Renal ROS  negative genitourinary   Musculoskeletal   Abdominal   Peds  Hematology negative hematology ROS (+)   Anesthesia Other Findings Past Medical History: No date: Anxiety No date: Asthma No date: Family history of adverse reaction to anesthes*     Comment: dad had difficulty waking up from anesthesia No date: GERD (gastroesophageal reflux disease) No date: Graves disease     Comment: Graves disease No date: Hyperlipemia No date: Hypertension 01/2014: Myocardial infarction [facility]) No date: Pre-diabetes 2009: Stroke [facility])     Comment: peripheral vision in left eye has been               affected,   Reproductive/Obstetrics negative OB ROS                             Anesthesia Physical Anesthesia Plan  ASA: III  Anesthesia Plan: General and Regional   Post-op Pain Management: GA combined w/ Regional for post-op pain   Induction:   Airway Management Planned:   Additional  Equipment:   Intra-op Plan:   Post-operative Plan:   Informed Consent: I have reviewed the patients History and Physical, chart, labs and discussed the procedure including the risks, benefits and alternatives for the proposed anesthesia with the patient or authorized representative who has indicated his/her understanding and acceptance.   Dental Advisory Given  Plan Discussed with: Anesthesiologist, CRNA and Surgeon  Anesthesia Plan Comments:         Anesthesia Quick Evaluation

## 2016-10-01 NOTE — Op Note (Signed)
10/01/2016  4:21 PM  Patient:   Caitlyn Roth  Pre-Op Diagnosis:   Impingement/tendinopathy with rotator cuff tear and biceps tendinopathy, right shoulder.  Post-Op Diagnosis: Impingement/tendinopathy with rotator cuff tear, labral fraying, early degenerative joint disease, and biceps tendinopathy, right shoulder.  Procedure: Limited arthroscopic debridement, arthroscopic subacromial decompression, mini-open rotator cuff repair, and mini-open biceps tenodesis, right shoulder.  Anesthesia: General endotracheal with interscalene block placed preoperatively by the anesthesiologist.  Surgeon:   Maryagnes Amos, MD  Assistant:   Horris Latino, PA-C  Findings: As above. There was extensive labral fraying involving the anterior, superior, and posterior superior aspects of the labrum without frank detachment from the glenoid. There was significant biceps tendinopathy with partial-thickness tearing of the long head of the biceps. There was a full-thickness tear involving the entire insertion of the supraspinatus. The remaining portions of the rotator cuff was in satisfactory condition. There also was a focal area of grade 4 chondromalacia involving the anterocentral portion of the humeral head. The glenoid articular cartilage was in satisfactory condition.  Complications: None  Fluids:   1000 cc  Estimated blood loss: 5 cc  Tourniquet time: None  Drains: None  Closure: Staples   Brief clinical note: The patient is a 51 year old female with a history of right shoulder pain. The patient's symptoms have progressed despite medications, activity modification, etc. The patient's history and examination are consistent with impingement/tendinopathy with a rotator cuff tear. These findings were confirmed by MRI scan. The patient presents at this time for definitive management of these shoulder symptoms.  Procedure: The patient underwent placement of an interscalene block by  the anesthesiologist in the preoperative holding area before being brought into the operating room and lain in the supine position. The patient then underwent general endotracheal intubation and anesthesia before being repositioned in the beach chair position using the beach chair positioner. The right shoulder and upper extremity were prepped with ChloraPrep solution before being draped sterilely. Preoperative antibiotics were administered. A timeout was performed to confirm the proper surgical site before the expected portal sites and incision site were injected with 0.5% Sensorcaine with epinephrine. A posterior portal was created and the glenohumeral joint thoroughly inspected with the findings as described above. An anterior portal was created using an outside-in technique. The labrum and rotator cuff were further probed, again confirming the above-noted findings. The areas of labral fraying were debrided back to stable margins using the full-radius resector, as were areas of synovitis. The ArthroCare wand was inserted and used to release the biceps tendon from its labral attachment. It also was used to obtain hemostasis as well as to "anneal" the labrum superiorly and anteriorly. The instruments were removed from the joint after suctioning the excess fluid.  The camera was repositioned through the posterior portal into the subacromial space. A separate lateral portal was created using an outside-in technique. The 3.5 mm full-radius resector was introduced and used to perform a subtotal bursectomy. The ArthroCare wand was then inserted and used to remove the periosteal tissue off the undersurface of the anterior third of the acromion as well as to recess the coracoacromial ligament from its attachment along the anterior and lateral margins of the acromion. The 4.0 mm acromionizing bur was introduced and used to complete the decompression by removing the undersurface of the anterior third of the acromion. The  full radius resector was reintroduced to remove any residual bony debris before the ArthroCare wand was reintroduced to obtain hemostasis. The instruments were then removed  from the subacromial space after suctioning the excess fluid.  An approximately 4-5 cm incision was made over the anterolateral aspect of the shoulder beginning at the anterolateral corner of the acromion and extending distally in line with the bicipital groove. This incision was carried down through the subcutaneous tissues to expose the deltoid fascia. The raphae between the anterior and middle thirds was identified and this plane developed to provide access into the subacromial space. Additional bursal tissues were debrided sharply using Metzenbaum scissors. The rotator cuff tear was readily identified. The margins were debrided sharply with a #15 blade and the exposed greater tuberosity roughened with a rongeur. The tear was repaired using four Biomet 2.9 mm JuggerKnot anchors. Several of these sutures were then brought back laterally and secured using two [place] QuatroLink anchors to create a two-layer closure. An apparent watertight closure was obtained.  The bicipital groove was identified by palpation and opened for 1-1.5 cm. The biceps tendon stump was retrieved through this defect. The floor of the bicipital groove was roughened with a curet before another Biomet 2.9 mm JuggerKnot anchor was inserted. Both sets of sutures were passed through the biceps tendon and tied securely to effect the tenodesis. The bicipital sheath was reapproximated using two #0 Ethibond interrupted sutures, incorporating the biceps tendon to further reinforce the tenodesis.  The wound was copiously irrigated with sterile saline solution before the deltoid raphae was reapproximated using 2-0 Vicryl interrupted sutures. The subcutaneous tissues were closed in two layers using 2-0 Vicryl interrupted sutures before the skin was closed using staples. The  portal sites also were closed using staples. A sterile bulky dressing was applied to the shoulder before the arm was placed into a shoulder immobilizer. The patient was then awakened, extubated, and returned to the recovery room in satisfactory condition after tolerating the procedure well.

## 2016-10-01 NOTE — Anesthesia Post-op Follow-up Note (Cosign Needed)
Anesthesia QCDR form completed.        

## 2016-10-01 NOTE — Discharge Instructions (Addendum)
Keep dressing dry and intact.  °May shower after dressing changed on post-op day #4 (Saturday).  °Cover staples with Band-Aids after drying off. °Apply ice frequently to shoulder. °Take oxycodone as prescribed when needed.  °May supplement with ES Tylenol if necessary. °Keep shoulder immobilizer on at all times except may remove for bathing purposes. °Follow-up in 10-14 days or as scheduled. ° °AMBULATORY SURGERY  °DISCHARGE INSTRUCTIONS ° ° °1) The drugs that you were given will stay in your system until tomorrow so for the next 24 hours you should not: ° °A) Drive an automobile °B) Make any legal decisions °C) Drink any alcoholic beverage ° ° °2) You may resume regular meals tomorrow.  Today it is better to start with liquids and gradually work up to solid foods. ° °You may eat anything you prefer, but it is better to start with liquids, then soup and crackers, and gradually work up to solid foods. ° ° °3) Please notify your doctor immediately if you have any unusual bleeding, trouble breathing, redness and pain at the surgery site, drainage, fever, or pain not relieved by medication. ° ° ° °4) Additional Instructions: ° °Please contact your physician with any problems or Same Day Surgery at [phone], Monday through Friday 6 am to 4 pm, or [facility] Main number at [phone]. °

## 2016-10-01 NOTE — Transfer of Care (Signed)
Immediate Anesthesia Transfer of Care Note  Patient: Caitlyn Roth  Procedure(s) Performed: Procedure(s) with comments: SHOULDER ARTHROSCOPY WITH OPEN ROTATOR CUFF REPAIR (Right) - Shoulder Block   Patient Location: PACU  Anesthesia Type:General and Regional  Level of Consciousness: sedated  Airway & Oxygen Therapy: Patient Spontanous Breathing and Patient connected to face mask oxygen  Post-op Assessment: Report given to RN and Post -op Vital signs reviewed and stable  Post vital signs: Reviewed and stable  Last Vitals:  Vitals:   10/01/16 1418 10/01/16 1612  BP: 127/89 124/70  Pulse: 80 87  Resp: 13 13  Temp:  36.3 C    Last Pain:  Vitals:   10/01/16 1215  TempSrc: Tympanic  PainSc: 5          Complications: No apparent anesthesia complications

## 2016-10-01 NOTE — OR Nursing (Signed)
Patient unable to take band off ring finger on left hand. Tapped and reported to Jonetta Speak RN.

## 2016-10-01 NOTE — H&P (Signed)
Paper H&P to be scanned into permanent record. H&P reviewed and patient re-examined. No changes. 

## 2016-10-02 ENCOUNTER — Encounter: Payer: Self-pay | Admitting: Surgery

## 2016-10-04 NOTE — Anesthesia Postprocedure Evaluation (Signed)
Anesthesia Post Note  Patient: Caitlyn Roth  Procedure(s) Performed: Procedure(s) (LRB): SHOULDER ARTHROSCOPY WITH OPEN ROTATOR CUFF REPAIR (Right)  Patient location during evaluation: PACU Anesthesia Type: Regional and General Level of consciousness: awake and alert Pain management: pain level controlled Vital Signs Assessment: post-procedure vital signs reviewed and stable Respiratory status: spontaneous breathing, nonlabored ventilation, respiratory function stable and patient connected to nasal cannula oxygen Cardiovascular status: blood pressure returned to baseline and stable Postop Assessment: no signs of nausea or vomiting Anesthetic complications: no     Last Vitals:  Vitals:   10/01/16 1704 10/01/16 1737  BP: 117/81 120/84  Pulse: 100 72  Resp: 16 16  Temp: 36.9 C     Last Pain:  Vitals:   10/01/16 1704  TempSrc: Oral  PainSc:                  Lenard Simmer

## 2017-01-15 DIAGNOSIS — F909 Attention-deficit hyperactivity disorder, unspecified type: Secondary | ICD-10-CM | POA: Insufficient documentation

## 2017-01-29 ENCOUNTER — Other Ambulatory Visit: Payer: Self-pay

## 2017-01-29 DIAGNOSIS — E0590 Thyrotoxicosis, unspecified without thyrotoxic crisis or storm: Secondary | ICD-10-CM

## 2017-01-29 DIAGNOSIS — K219 Gastro-esophageal reflux disease without esophagitis: Secondary | ICD-10-CM

## 2017-01-29 DIAGNOSIS — I10 Essential (primary) hypertension: Secondary | ICD-10-CM

## 2017-01-29 DIAGNOSIS — E559 Vitamin D deficiency, unspecified: Secondary | ICD-10-CM

## 2017-01-29 DIAGNOSIS — E782 Mixed hyperlipidemia: Secondary | ICD-10-CM | POA: Insufficient documentation

## 2017-01-29 DIAGNOSIS — E538 Deficiency of other specified B group vitamins: Secondary | ICD-10-CM

## 2017-01-29 DIAGNOSIS — R7303 Prediabetes: Secondary | ICD-10-CM

## 2017-01-30 LAB — HGB A1C W/O EAG: Hgb A1c MFr Bld: 6.3 % — ABNORMAL HIGH (ref 4.8–5.6)

## 2017-01-30 LAB — CMP12+LP+TP+TSH+6AC+CBC/D/PLT
ALT: 35 IU/L — AB (ref 0–32)
AST: 18 IU/L (ref 0–40)
Albumin/Globulin Ratio: 2.1 (ref 1.2–2.2)
Albumin: 4.8 g/dL (ref 3.5–5.5)
Alkaline Phosphatase: 134 IU/L — ABNORMAL HIGH (ref 39–117)
BASOS ABS: 0 10*3/uL (ref 0.0–0.2)
BASOS: 0 %
BILIRUBIN TOTAL: 0.3 mg/dL (ref 0.0–1.2)
BUN / CREAT RATIO: 21 (ref 9–23)
BUN: 18 mg/dL (ref 6–24)
CALCIUM: 9.8 mg/dL (ref 8.7–10.2)
CHLORIDE: 103 mmol/L (ref 96–106)
Chol/HDL Ratio: 4.8 ratio — ABNORMAL HIGH (ref 0.0–4.4)
Cholesterol, Total: 163 mg/dL (ref 100–199)
Creatinine, Ser: 0.87 mg/dL (ref 0.57–1.00)
EOS (ABSOLUTE): 0 10*3/uL (ref 0.0–0.4)
ESTIMATED CHD RISK: 1.2 times avg. — AB (ref 0.0–1.0)
Eos: 0 %
FREE THYROXINE INDEX: 1.8 (ref 1.2–4.9)
GFR calc non Af Amer: 77 mL/min/{1.73_m2} (ref >59)
GFR, EST AFRICAN AMERICAN: 89 mL/min/{1.73_m2} (ref >59)
GGT: 49 IU/L (ref 0–60)
GLUCOSE: 110 mg/dL — AB (ref 65–99)
Globulin, Total: 2.3 g/dL (ref 1.5–4.5)
HDL: 34 mg/dL — ABNORMAL LOW (ref >39)
Hematocrit: 40.8 % (ref 34.0–46.6)
Hemoglobin: 13.8 g/dL (ref 11.1–15.9)
IMMATURE GRANS (ABS): 0 10*3/uL (ref 0.0–0.1)
IMMATURE GRANULOCYTES: 0 %
IRON: 89 ug/dL (ref 27–159)
LDH: 185 IU/L (ref 119–226)
LDL CALC: 102 mg/dL — AB (ref 0–99)
LYMPHS ABS: 3 10*3/uL (ref 0.7–3.1)
LYMPHS: 20 %
MCH: 29.9 pg (ref 26.6–33.0)
MCHC: 33.8 g/dL (ref 31.5–35.7)
MCV: 89 fL (ref 79–97)
MONOCYTES: 6 %
Monocytes Absolute: 0.9 10*3/uL (ref 0.1–0.9)
Neutrophils Absolute: 10.9 10*3/uL — ABNORMAL HIGH (ref 1.4–7.0)
Neutrophils: 74 %
PLATELETS: 310 10*3/uL (ref 150–379)
Phosphorus: 4.1 mg/dL (ref 2.5–4.5)
Potassium: 4.6 mmol/L (ref 3.5–5.2)
RBC: 4.61 x10E6/uL (ref 3.77–5.28)
RDW: 14.5 % (ref 12.3–15.4)
SODIUM: 142 mmol/L (ref 134–144)
T3 Uptake Ratio: 25 % (ref 24–39)
T4, Total: 7 ug/dL (ref 4.5–12.0)
TOTAL PROTEIN: 7.1 g/dL (ref 6.0–8.5)
TSH: 0.569 u[IU]/mL (ref 0.450–4.500)
Triglycerides: 136 mg/dL (ref 0–149)
Uric Acid: 5.8 mg/dL (ref 2.5–7.1)
VLDL CHOLESTEROL CAL: 27 mg/dL (ref 5–40)
WBC: 15 10*3/uL — AB (ref 3.4–10.8)

## 2017-01-30 LAB — B12 AND FOLATE PANEL
FOLATE: 7.2 ng/mL (ref >3.0)
Vitamin B-12: 557 pg/mL (ref 232–1245)

## 2017-01-30 LAB — MAGNESIUM: Magnesium: 2.1 mg/dL (ref 1.6–2.3)

## 2017-01-30 LAB — VITAMIN D 25 HYDROXY (VIT D DEFICIENCY, FRACTURES): Vit D, 25-Hydroxy: 64.3 ng/mL (ref 30.0–100.0)

## 2017-01-31 ENCOUNTER — Other Ambulatory Visit: Payer: Self-pay

## 2017-02-03 LAB — IRON,TIBC AND FERRITIN PANEL
Ferritin: 47 ng/mL (ref 15–150)
IRON: 91 ug/dL (ref 27–159)
Iron Saturation: 23 % (ref 15–55)
Total Iron Binding Capacity: 401 ug/dL (ref 250–450)
UIBC: 310 ug/dL (ref 131–425)

## 2017-02-04 ENCOUNTER — Other Ambulatory Visit: Payer: Self-pay

## 2017-10-28 ENCOUNTER — Emergency Department: Payer: BLUE CROSS/BLUE SHIELD

## 2017-10-28 ENCOUNTER — Encounter: Payer: Self-pay | Admitting: Emergency Medicine

## 2017-10-28 ENCOUNTER — Emergency Department
Admission: EM | Admit: 2017-10-28 | Discharge: 2017-10-28 | Disposition: A | Discharge status: Home / Self Care | Payer: BLUE CROSS/BLUE SHIELD | Attending: Emergency Medicine | Admitting: Emergency Medicine

## 2017-10-28 ENCOUNTER — Other Ambulatory Visit: Payer: Self-pay

## 2017-10-28 DIAGNOSIS — R079 Chest pain, unspecified: Secondary | ICD-10-CM | POA: Insufficient documentation

## 2017-10-28 DIAGNOSIS — Z5321 Procedure and treatment not carried out due to patient leaving prior to being seen by health care provider: Secondary | ICD-10-CM | POA: Diagnosis not present

## 2017-10-28 LAB — BASIC METABOLIC PANEL
Anion gap: 10 (ref 5–15)
BUN: 15 mg/dL (ref 6–20)
CHLORIDE: 102 mmol/L (ref 101–111)
CO2: 24 mmol/L (ref 22–32)
CREATININE: 0.74 mg/dL (ref 0.44–1.00)
Calcium: 9.9 mg/dL (ref 8.9–10.3)
GFR calc non Af Amer: >60 mL/min (ref >60)
Glucose, Bld: 113 mg/dL — ABNORMAL HIGH (ref 65–99)
POTASSIUM: 4.3 mmol/L (ref 3.5–5.1)
SODIUM: 136 mmol/L (ref 135–145)

## 2017-10-28 LAB — CBC
HEMATOCRIT: 43.1 % (ref 35.0–47.0)
HEMOGLOBIN: 14.4 g/dL (ref 12.0–16.0)
MCH: 27.7 pg (ref 26.0–34.0)
MCHC: 33.5 g/dL (ref 32.0–36.0)
MCV: 82.9 fL (ref 80.0–100.0)
PLATELETS: 282 10*3/uL (ref 150–440)
RBC: 5.2 MIL/uL (ref 3.80–5.20)
RDW: 21.3 % — ABNORMAL HIGH (ref 11.5–14.5)
WBC: 10.4 10*3/uL (ref 3.6–11.0)

## 2017-10-28 LAB — TROPONIN I: Troponin I: <0.03 ng/mL (ref <0.03)

## 2017-10-28 NOTE — ED Triage Notes (Signed)
Also says she has been having bloating in stomach after eating since yesterday.

## 2017-10-28 NOTE — ED Triage Notes (Signed)
Says mid sternal chest pain--heavyness/dull that rad through to back.

## 2017-10-31 ENCOUNTER — Telehealth: Payer: Self-pay | Admitting: Emergency Medicine

## 2017-10-31 NOTE — Telephone Encounter (Signed)
Called patient due to lwot to inquire about condition and follow up plans. Left message.   

## 2018-04-06 ENCOUNTER — Ambulatory Visit: Payer: Self-pay | Admitting: Medical

## 2018-07-17 ENCOUNTER — Other Ambulatory Visit: Payer: Self-pay | Admitting: Physician Assistant

## 2018-07-17 DIAGNOSIS — I10 Essential (primary) hypertension: Secondary | ICD-10-CM

## 2018-07-17 DIAGNOSIS — R7989 Other specified abnormal findings of blood chemistry: Secondary | ICD-10-CM

## 2018-07-17 DIAGNOSIS — R945 Abnormal results of liver function studies: Principal | ICD-10-CM

## 2018-07-23 ENCOUNTER — Other Ambulatory Visit: Payer: Self-pay

## 2018-07-23 ENCOUNTER — Ambulatory Visit
Admission: RE | Admit: 2018-07-23 | Discharge: 2018-07-23 | Disposition: A | Discharge status: Home / Self Care | Payer: BLUE CROSS/BLUE SHIELD | Source: Ambulatory Visit | Attending: Physician Assistant | Admitting: Physician Assistant

## 2018-07-23 DIAGNOSIS — I10 Essential (primary) hypertension: Secondary | ICD-10-CM | POA: Diagnosis present

## 2018-07-23 DIAGNOSIS — R945 Abnormal results of liver function studies: Secondary | ICD-10-CM | POA: Insufficient documentation

## 2018-07-23 DIAGNOSIS — R7989 Other specified abnormal findings of blood chemistry: Secondary | ICD-10-CM

## 2018-12-28 DIAGNOSIS — E1165 Type 2 diabetes mellitus with hyperglycemia: Secondary | ICD-10-CM | POA: Insufficient documentation

## 2018-12-28 DIAGNOSIS — E0500 Thyrotoxicosis with diffuse goiter without thyrotoxic crisis or storm: Secondary | ICD-10-CM | POA: Insufficient documentation

## 2018-12-28 DIAGNOSIS — F17200 Nicotine dependence, unspecified, uncomplicated: Secondary | ICD-10-CM | POA: Insufficient documentation

## 2019-02-19 ENCOUNTER — Emergency Department: Payer: BC Managed Care – PPO

## 2019-02-19 ENCOUNTER — Other Ambulatory Visit: Payer: Self-pay

## 2019-02-19 ENCOUNTER — Emergency Department
Admission: EM | Admit: 2019-02-19 | Discharge: 2019-02-19 | Disposition: A | Discharge status: Home / Self Care | Payer: BC Managed Care – PPO | Attending: Student | Admitting: Student

## 2019-02-19 DIAGNOSIS — R002 Palpitations: Secondary | ICD-10-CM | POA: Diagnosis present

## 2019-02-19 DIAGNOSIS — Z79899 Other long term (current) drug therapy: Secondary | ICD-10-CM | POA: Insufficient documentation

## 2019-02-19 DIAGNOSIS — Z7902 Long term (current) use of antithrombotics/antiplatelets: Secondary | ICD-10-CM | POA: Insufficient documentation

## 2019-02-19 DIAGNOSIS — I2510 Atherosclerotic heart disease of native coronary artery without angina pectoris: Secondary | ICD-10-CM | POA: Insufficient documentation

## 2019-02-19 DIAGNOSIS — I491 Atrial premature depolarization: Secondary | ICD-10-CM | POA: Diagnosis not present

## 2019-02-19 DIAGNOSIS — F17210 Nicotine dependence, cigarettes, uncomplicated: Secondary | ICD-10-CM | POA: Insufficient documentation

## 2019-02-19 DIAGNOSIS — I252 Old myocardial infarction: Secondary | ICD-10-CM | POA: Diagnosis not present

## 2019-02-19 DIAGNOSIS — E119 Type 2 diabetes mellitus without complications: Secondary | ICD-10-CM | POA: Diagnosis not present

## 2019-02-19 DIAGNOSIS — Z8673 Personal history of transient ischemic attack (TIA), and cerebral infarction without residual deficits: Secondary | ICD-10-CM | POA: Insufficient documentation

## 2019-02-19 DIAGNOSIS — I10 Essential (primary) hypertension: Secondary | ICD-10-CM | POA: Diagnosis not present

## 2019-02-19 HISTORY — DX: Type 2 diabetes mellitus without complications: E11.9

## 2019-02-19 LAB — CBC
HCT: 42.2 % (ref 36.0–46.0)
Hemoglobin: 14.4 g/dL (ref 12.0–15.0)
MCH: 30.6 pg (ref 26.0–34.0)
MCHC: 34.1 g/dL (ref 30.0–36.0)
MCV: 89.6 fL (ref 80.0–100.0)
Platelets: 285 10*3/uL (ref 150–400)
RBC: 4.71 MIL/uL (ref 3.87–5.11)
RDW: 13.2 % (ref 11.5–15.5)
WBC: 12.2 10*3/uL — ABNORMAL HIGH (ref 4.0–10.5)
nRBC: 0 % (ref 0.0–0.2)

## 2019-02-19 LAB — BASIC METABOLIC PANEL
Anion gap: 11 (ref 5–15)
BUN: 14 mg/dL (ref 6–20)
CO2: 22 mmol/L (ref 22–32)
Calcium: 9.7 mg/dL (ref 8.9–10.3)
Chloride: 103 mmol/L (ref 98–111)
Creatinine, Ser: 0.76 mg/dL (ref 0.44–1.00)
GFR calc Af Amer: >60 mL/min (ref >60)
GFR calc non Af Amer: >60 mL/min (ref >60)
Glucose, Bld: 123 mg/dL — ABNORMAL HIGH (ref 70–99)
Potassium: 3.8 mmol/L (ref 3.5–5.1)
Sodium: 136 mmol/L (ref 135–145)

## 2019-02-19 LAB — TROPONIN I (HIGH SENSITIVITY)
Troponin I (High Sensitivity): 4 ng/L (ref <18)
Troponin I (High Sensitivity): 4 ng/L (ref <18)

## 2019-02-19 NOTE — ED Triage Notes (Signed)
Reports palpitations and left jaw/left shoulder pain that started today approx 9AM. Took 2 nitroglycerin 1240PM and 1250PM with no relief. Reports hypertension at home. Pt alert and oriented X4, cooperative, RR even and unlabored, color WNL. Pt in NAD. Mild SOB reported.

## 2019-02-19 NOTE — ED Notes (Signed)
Otho Perl, ED secretary called stating pts husband on the phone inquiring as to why patient is still waiting to be seen. Otho Perl was informed that pt has been told that we cannot give wait time and that patient is currently waiting to speak with charge nurse.

## 2019-02-19 NOTE — ED Notes (Signed)
Pt to front desk requesting to speak with the charge nurse. [facility] informed.

## 2019-02-19 NOTE — Discharge Instructions (Signed)
Thank you for letting us take care of you in the emergency department today.  ° °Please continue to take any regular, prescribed medications.  ° °Please follow up with: °- Your primary care doctor to review your ER visit and follow up on your symptoms.  ° °Please return to the ER for any new or worsening symptoms.  ° °

## 2019-02-19 NOTE — ED Notes (Signed)
Pt refusing repeat troponin. Charge RN, Larene Beach requesting to repeat pt VS after having conversation with patient. Charge RN requesting this RN to call her with repeat VS.

## 2019-02-19 NOTE — ED Notes (Signed)
RN asked pts visitor to wait outside. Pt stated to visitor "they are only making you do that because they are made at me, I'll probably be sitting here another 3 hours waiting". RN explained to pt that it is our policy that we do not have visitors in the lobby at this time.

## 2019-02-19 NOTE — ED Notes (Signed)
Patient seen walking out of ED, in NAD

## 2019-02-19 NOTE — ED Notes (Signed)
Pt to front desk asking about wait time. Pt states that "I was told I was the next one to go back and there have been 5 or 6 people to go back". This RN explained to pt that there are different area that pts are taken to when called back. Pt informed that she is the longest wait, however, that does not mean she will be the next to go back, as people are seen based on acuity.

## 2019-02-19 NOTE — ED Notes (Signed)
Pt ambulating around waiting room in NAD. Pt seen with a bottle of water.

## 2019-02-19 NOTE — ED Notes (Signed)
Larene Beach, charge nurse speaking with pt

## 2019-02-19 NOTE — ED Provider Notes (Signed)
[facility] Emergency Department Provider Note  ____________________________________________   First MD Initiated Contact with Patient 02/19/19 1822     (approximate)  I have reviewed the triage vital signs and the nursing notes.  History  Chief Complaint Palpitations and Chest Pain    HPI Caitlyn Roth is a 53 y.o. female with a history of diabetes, Graves' disease, CAD who presents emergency department for heart pounding sensation and palpitations.  Patient states this morning she started to feel like her heart was racing and experienced associated heart pounding sensation.  She denies any true associated chest pain.  She does report chronic chest pressure, but states that this has been present for years (and has been evaluated by her cardiologist), and is unchanged with this episode.  She denies any associated shortness of breath, nausea, vomiting, diaphoresis.  She reports a history of Graves' disease, recently saw her endocrinologist, and had thyroid studies that were within normal limits.  She denies any history of arrhythmias.   Past Medical Hx Past Medical History:  Diagnosis Date  . Anxiety   . Asthma   . Diabetes mellitus without complication ([place])   . Family history of adverse reaction to anesthesia    dad had difficulty waking up from anesthesia  . GERD (gastroesophageal reflux disease)   . Graves disease    Graves disease  . Hyperlipemia   . Hypertension   . Myocardial infarction ([place]) 01/2014  . Pre-diabetes   . Stroke [facility]) 2009   peripheral vision in left eye has been affected,    Problem List Patient Active Problem List   Diagnosis Date Noted  . Mixed hyperlipidemia 01/29/2017  . Vitamin D deficiency 01/29/2017  . Biceps tendinitis of right upper extremity 08/02/2016  . Complete tear of right rotator cuff 08/02/2016  . Arteriosclerotic coronary artery disease 02/07/2015  . Cerebral infarction ([place]) 11/03/2014  . Eczema  11/03/2014  . Hypertension 11/03/2014  . Hyperthyroidism 11/03/2014  . Migraine 11/03/2014  . Hypercholesterolemia 11/11/2007  . Allergic rhinitis 08/12/2007  . Barrett's esophagus without dysplasia 08/12/2007  . Gastro-esophageal reflux disease without esophagitis 08/12/2007    Past Surgical Hx Past Surgical History:  Procedure Laterality Date  . CARDIAC CATHETERIZATION  2015   stents x 1  . cardiac stents    . CESAREAN SECTION    . CHOLECYSTECTOMY    . CORONARY ANGIOPLASTY  2016  . KNEE ARTHROSCOPY    . KNEE SURGERY Left 1990   McKay's procedure  . SHOULDER ARTHROSCOPY WITH OPEN ROTATOR CUFF REPAIR Right 10/01/2016   Procedure: SHOULDER ARTHROSCOPY WITH OPEN ROTATOR CUFF REPAIR;  Surgeon: Corky Mull, MD;  Location: [facility] ORS;  Service: Orthopedics;  Laterality: Right;  Shoulder Block   . SINUS SURGERY WITH INSTATRAK    . TONSILLECTOMY  1978  . TUBAL LIGATION      Medications Prior to Admission medications   Medication Sig Start Date End Date Taking? Authorizing Provider  amphetamine-dextroamphetamine (ADDERALL XR) 30 MG 24 hr capsule Take 30 mg by mouth. 02/19/14   [provider]  atorvastatin (LIPITOR) 80 MG tablet Take 1 tablet by mouth daily. 04/22/15   [provider]  Clopidogrel Bisulfate (PLAVIX PO) Take 80 mg by mouth daily.    [provider]  Cyanocobalamin (B-12) 1000 MCG/ML KIT Inject 1 mL as directed once a week.    [provider]  desloratadine (CLARINEX) 5 MG tablet Take 5 mg by mouth daily.     [provider]  docusate sodium (COLACE) 100 MG capsule Take 100 mg by mouth daily.    [provider]  DULERA 200-5 MCG/ACT AERO Inhale 2 puffs into the lungs 2 (two) times daily. 05/04/15   [provider]  DUREZOL 0.05 % EMUL Place 1 drop into the right eye 2 (two) times daily. 05/18/15   [provider]  esomeprazole (NEXIUM) 40 MG capsule Take 40 mg by mouth at bedtime.     [provider]  lisinopril (PRINIVIL,ZESTRIL) 2.5 MG tablet Take 1 tablet by mouth daily. 04/22/15   [provider]  methimazole (TAPAZOLE) 5 MG tablet Take 2.5 mg by mouth daily.    [provider]  metoprolol succinate (TOPROL-XL) 50 MG 24 hr tablet Take 50 mg by mouth daily. Take with or immediately following a meal.    [provider]  montelukast (SINGULAIR) 10 MG tablet Take 10 mg by mouth at bedtime.    [provider]  oxyCODONE (ROXICODONE) 5 MG immediate release tablet Take 1-2 tablets (5-10 mg total) by mouth every 4 (four) hours as needed for severe pain. 10/01/16   Poggi, Marshall Cork, MD  Probiotic Product (PROBIOTIC DAILY PO) Take 1 capsule by mouth daily.    [provider]  venlafaxine XR (EFFEXOR-XR) 150 MG 24 hr capsule Take 75 mg by mouth daily with breakfast. 3 capsules    [provider]    Allergies Cefdinir, Lac bovis, Acyclovir, Codeine, and Other  Family Hx Family History  Problem Relation Age of Onset  . Breast cancer Maternal Aunt 60    Social Hx Social History   Tobacco Use  . Smoking status: Current Every Day Smoker    Packs/day: 0.25    Years: 35.00    Pack years: 8.75    Types: Cigarettes    Last attempt to quit: 01/27/2015    Years since quitting: 4.0  . Smokeless tobacco: Never Used  Substance Use Topics  . Alcohol use: Yes  . Drug use: No     Review of Systems  Constitutional: Negative for fever, chills. Eyes: Negative for visual changes. ENT: Negative for sore throat. Cardiovascular: + palpitations, heart pounding Respiratory: Negative for shortness of breath. Gastrointestinal: Negative for nausea, vomiting.  Genitourinary: Negative for dysuria. Musculoskeletal: Negative for leg swelling. Skin: Negative for rash. Neurological: Negative for for headaches.   Physical Exam  Vital Signs: ED Triage Vitals [02/19/19 1339]  Enc Vitals Group     BP 119/79     Pulse Rate 93     Resp 18      Temp 98.4 F (36.9 C)     Temp Source Oral     SpO2 100 %     Weight 215 lb (97.5 kg)     Height 5' 9"  (1.753 m)     Head Circumference      Peak Flow      Pain Score 3     Pain Loc      Pain Edu?      Excl. in [place]?     Constitutional: Alert and oriented.  Head: Normocephalic. Atraumatic. Eyes: Conjunctivae clear. Sclera anicteric. Nose: No congestion. No rhinorrhea. Mouth/Throat: Mucous membranes are moist.  Neck: No stridor.   Cardiovascular: Normal rate, regular rhythm. No murmurs. Extremities well perfused. Respiratory: Normal respiratory effort.  Lungs CTAB. Gastrointestinal: Soft. Non-tender. Non-distended.  Musculoskeletal: No lower extremity edema. No deformities. Neurologic:  Normal speech and language. No gross focal neurologic deficits are appreciated.  Skin:  Skin is warm, dry and intact. No rash noted. Psychiatric: Mood and affect are appropriate for situation.  EKG  Personally reviewed.   Rate: 95 Rhythm: Sinus Axis: Normal Intervals: Within normal limits PACs Sinus rhythm with PACs, no acute ischemic changes No evidence of Brugada, WPW, or prolonged QTC No STEMI    Radiology  CXR: IMPRESSION:  No acute abnormality.    Procedures  Procedure(s) performed (including critical care):  Procedures   Initial Impression / Assessment and Plan / ED Course  53 y.o. female who presents to the ED for palpitations associated with some heart pounding sensation.  Ddx: arrhythmia, atypical ACS, PACs/PVCs, electrolyte abnormality.  Less likely thyroid etiology given she reports seeing her endocrinologist within the last few months with normal thyroid studies.  Plan: labs, EKG, imaging  EKG without acute ischemic changes, PACs are present, which could be the etiology of her palpitation-like sensation.  No other evidence of arrhythmia.  Labs without actionable derangements, electrolytes within normal limits.  High-sensitivity troponin x 2 negative.  X-ray  negative.  Given her negative work-up, will proceed with discharge, advised PCP/cardiology follow-up.  Patient agreeable with plan.  Given return precautions.    Final Clinical Impression(s) / ED Diagnosis  Final diagnoses:  Heart palpitations  Pounding heartbeat  PAC (premature atrial contraction)       Note:  This document was prepared using Dragon voice recognition software and may include unintentional dictation errors.   Lilia Pro., MD 02/19/19 2033

## 2019-02-19 NOTE — ED Notes (Signed)
Charge RN Larene Beach told in person. Pt agreeable to stay.

## 2019-02-19 NOTE — ED Notes (Signed)
Charge RN to lobby to talk to patient. All situations explained to the best of this RNs ability and wait time explained again. Pt reporting understanding and requesting her BP be rechecked but pt denies wanting her blood drawn again. Pt agreeing to stay for a room. NAD at this time.

## 2019-06-16 IMAGING — CR DG CHEST 2V
1 series · 2 of 2 positions shown · non-contrast
Comparison: 04/09/2015

CLINICAL DATA: Chest pain, MI 2 years ago, hypertension, borderline
diabetes mellitus, dull chest pain for more than 4 hours, recent
shortness of breath, history GERD, asthma, former smoker

EXAM:
CHEST - 2 VIEW

[Series 1: dg chest 2 view · 0.14mm/px · 2 of 2 slices shown]
[im 1/2]
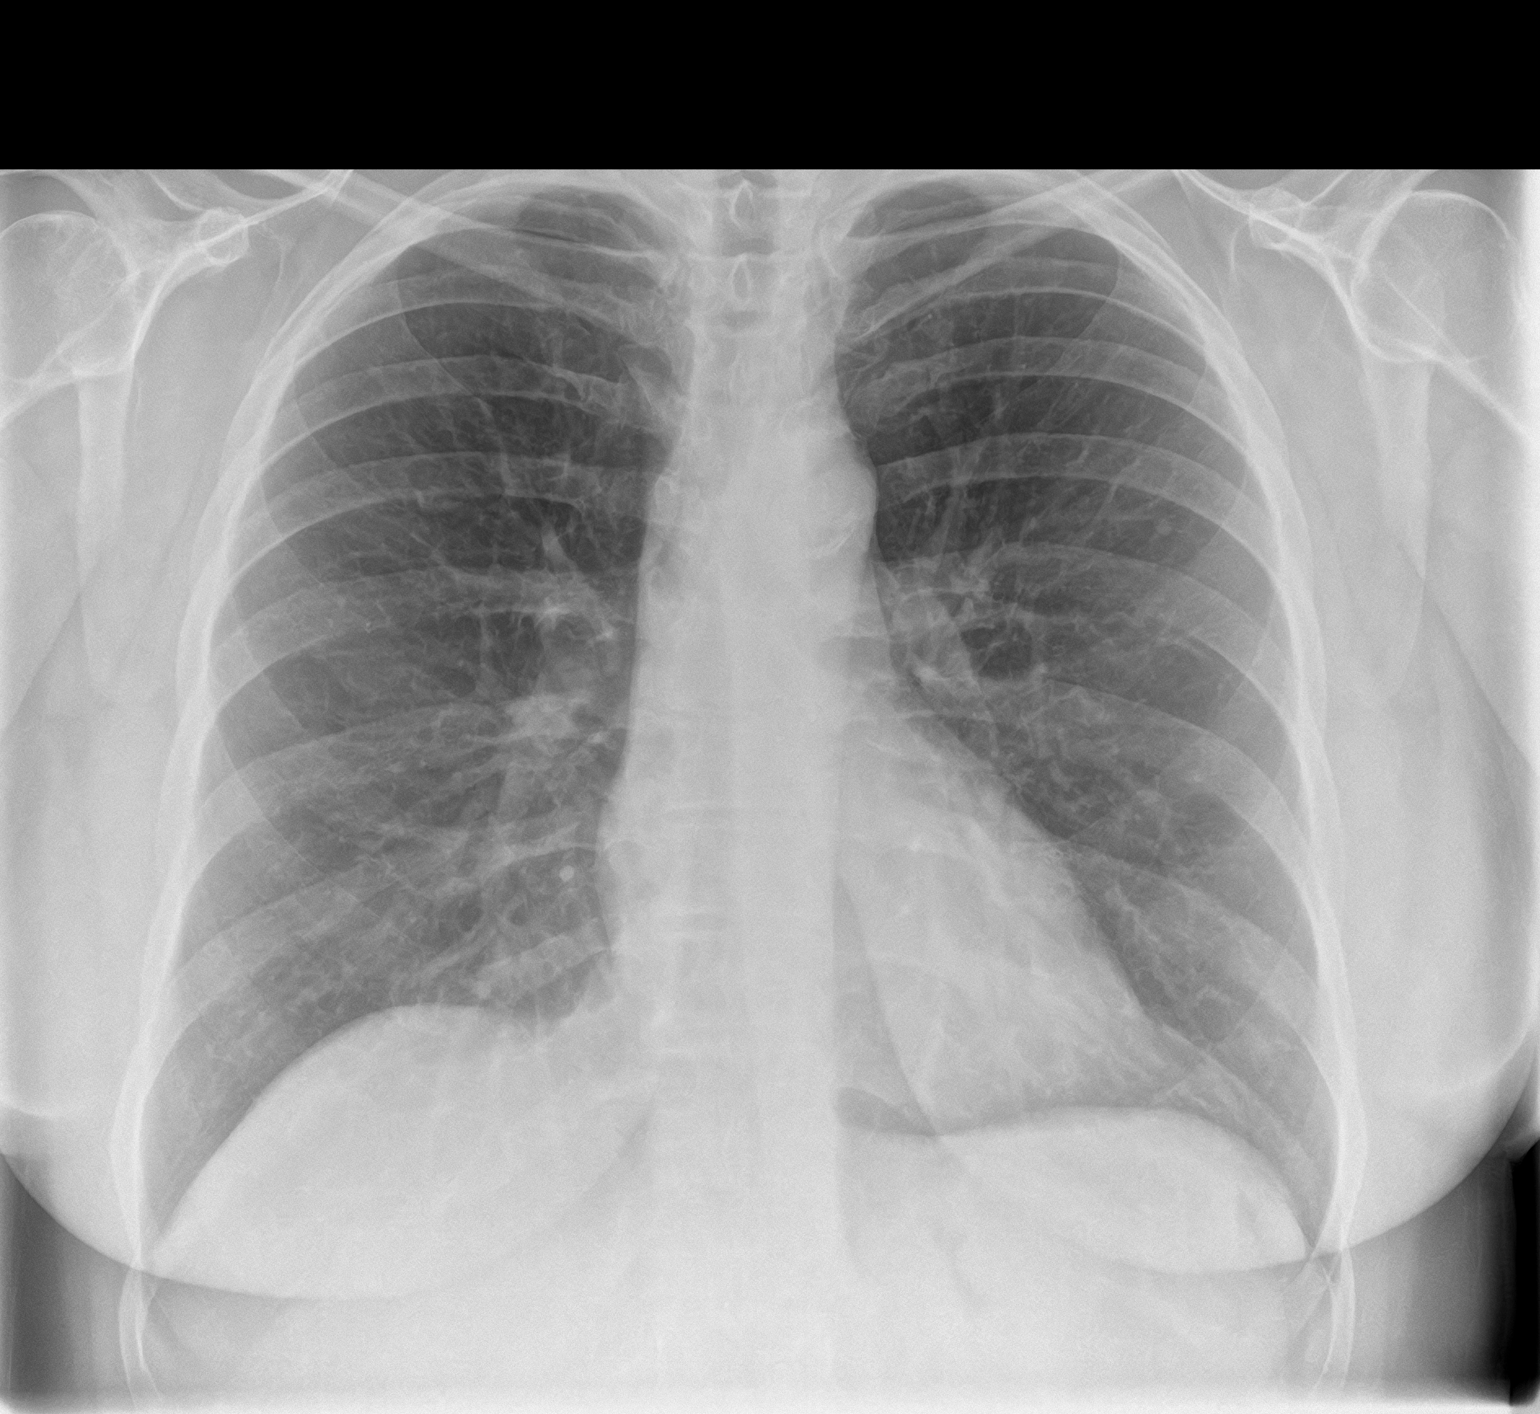
[im 2/2]
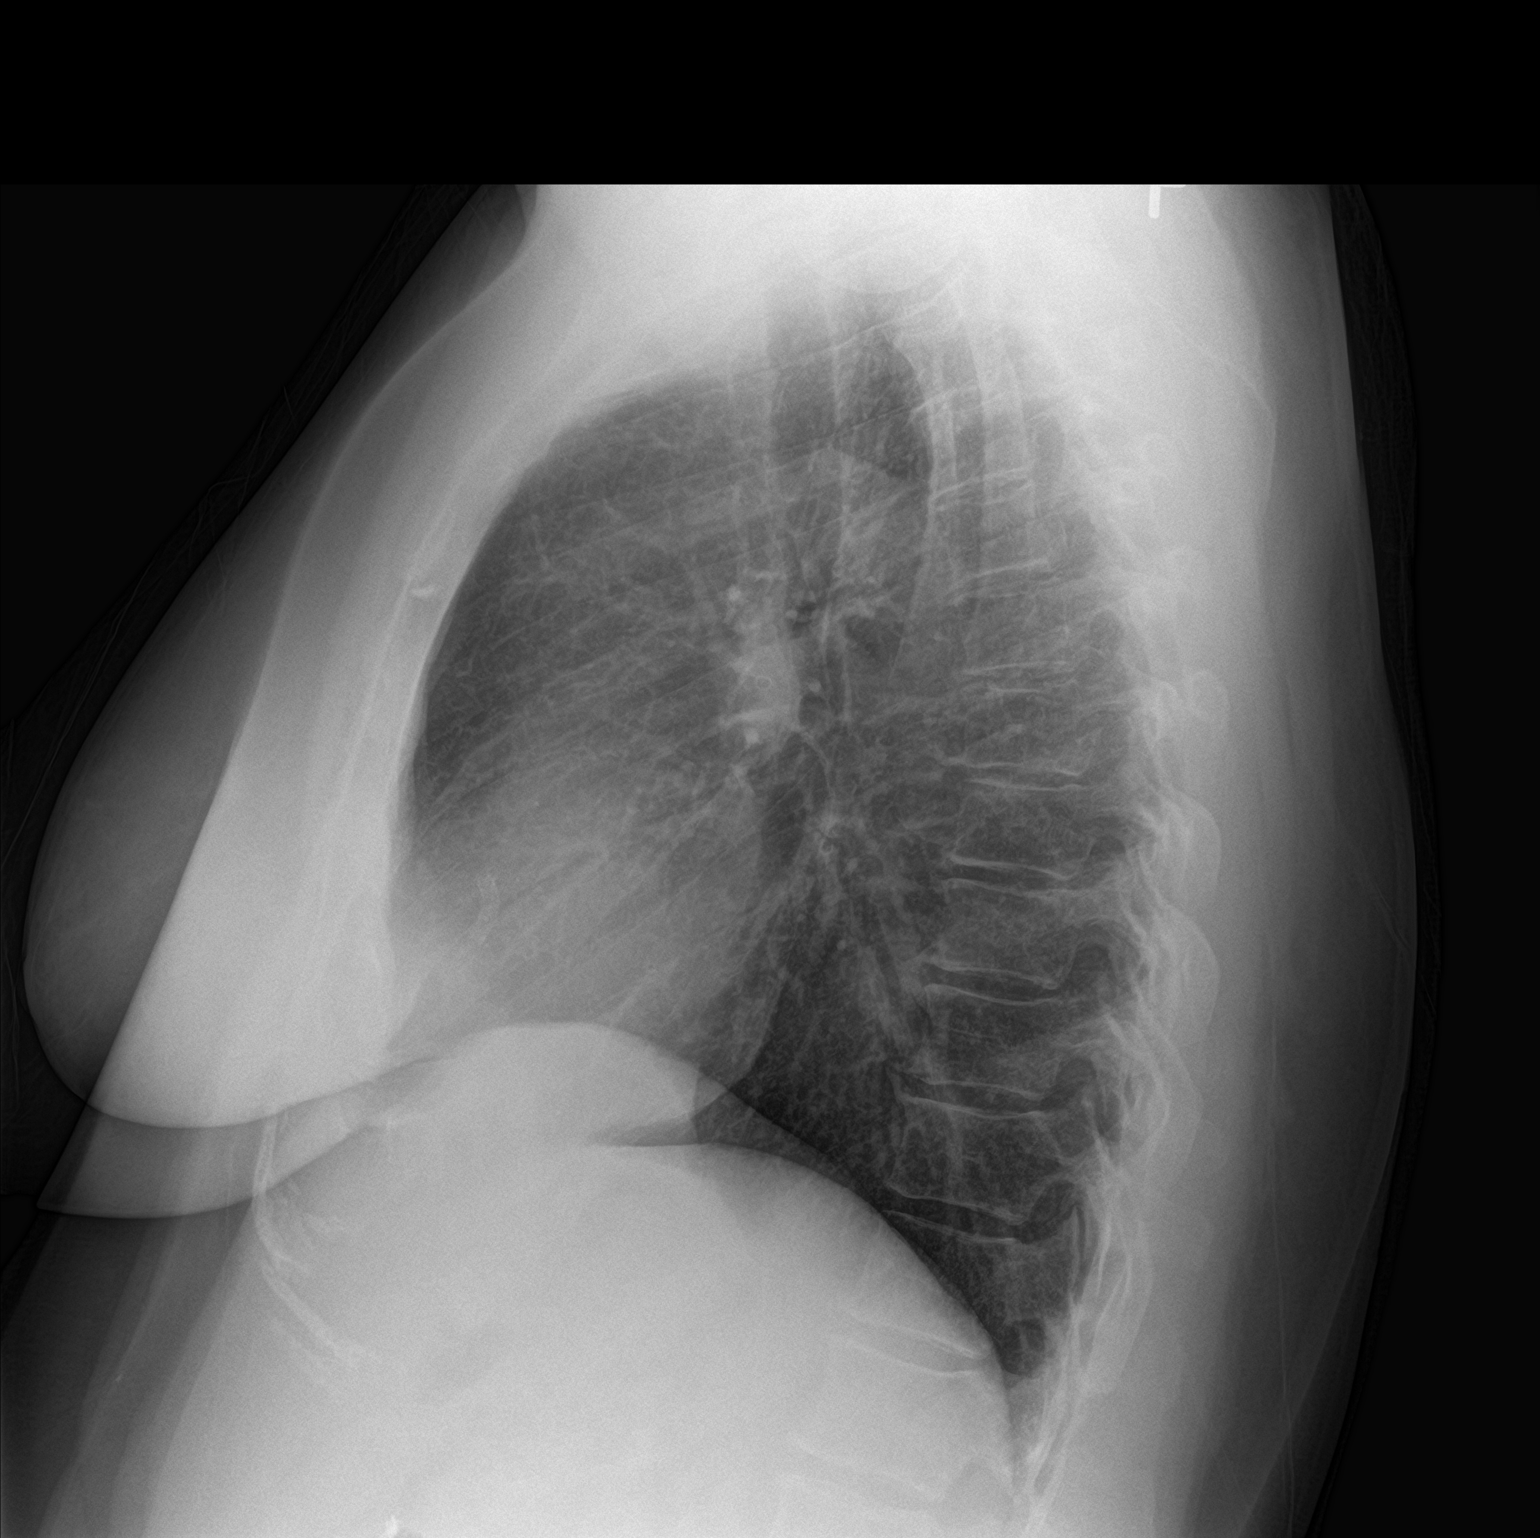

[2 of 2 positions shown; findings below may reference images not displayed]

FINDINGS: Normal heart size, mediastinal contours, and pulmonary vascularity.

Lungs clear.

No pleural effusion or pneumothorax.

Small chronic nodular density RIGHT upper lobe present since at
least 1293.

No acute osseous findings.

Bones unremarkable.
IMPRESSION: No acute abnormalities.

## 2019-08-05 ENCOUNTER — Ambulatory Visit: Payer: Self-pay | Attending: Internal Medicine

## 2019-08-05 DIAGNOSIS — Z23 Encounter for immunization: Secondary | ICD-10-CM

## 2019-08-05 NOTE — Progress Notes (Signed)
Covid-19 Vaccination Clinic  Name:  Caitlyn Roth    MRN: [id number] DOB: 21-Sep-1965  08/05/2019  [name] was observed post Covid-19 immunization for 15 minutes without incident. She was provided with Vaccine Information Sheet and instruction to access the V-Safe system.   [name] was instructed to call 911 with any severe reactions post vaccine: Marland Kitchen Difficulty breathing  . Swelling of face and throat  . A fast heartbeat  . A bad rash all over body  . Dizziness and weakness   Immunizations Administered    Name Date Dose VIS Date Route   Pfizer COVID-19 Vaccine 08/05/2019  9:02 AM 0.3 mL 04/30/2019 Intramuscular   Manufacturer: [employer], [employer]   Lot: [id number]   NDC: [address]-6

## 2019-08-11 ENCOUNTER — Telehealth: Payer: Self-pay | Admitting: Medical

## 2019-08-11 ENCOUNTER — Other Ambulatory Visit: Payer: Self-pay

## 2019-08-11 ENCOUNTER — Ambulatory Visit: Payer: Self-pay

## 2019-08-11 ENCOUNTER — Encounter: Payer: Self-pay | Admitting: Medical

## 2019-08-11 DIAGNOSIS — Z20822 Contact with and (suspected) exposure to covid-19: Secondary | ICD-10-CM

## 2019-08-11 DIAGNOSIS — U071 COVID-19: Secondary | ICD-10-CM

## 2019-08-11 LAB — POC COVID19 BINAXNOW: SARS Coronavirus 2 Ag: NEGATIVE

## 2019-08-11 NOTE — Progress Notes (Signed)
54 yo female in non acute distress. Got 1st Covid-19 Vaccine on 08/05/2019. Started with symptoms on Saturday 08/07/19 with HA,  nasal congestion, nausea, fatigue Denies fever , diarrhea heavinesse in chest and coughing alot    2017 stent placement, smoker.  Physical exam non preformed telemedicine appointment. POC Covid-19 test Negative a  Covid-19 PCR is pending..     Symptomatic patient. Possibly Covid. It is possible that this is just a side effect of the Covid-19 vaccine the patient received  on  08/05/2019 symptoms  or an actual infection of Covid-19. PCR test is Pending. She is to rest, monitor symptoms and fever take OTC Tylenol per package directions. Shortness of breath or chest pain go to the nearest  Emergency Department for evaluation and treatment.  Patient verbalizes understanding and has no questions at discharge.

## 2019-08-11 NOTE — Addendum Note (Signed)
Addended by: Jethro Bolus on: 08/11/2019 02:58 PM   Modules accepted: Orders

## 2019-08-11 NOTE — Progress Notes (Signed)
n

## 2019-08-12 LAB — NOVEL CORONAVIRUS, NAA: SARS-CoV-2, NAA: NOT DETECTED

## 2019-08-12 LAB — SARS-COV-2, NAA 2 DAY TAT

## 2019-08-12 NOTE — Progress Notes (Signed)
Please contact patient , most likely a viral infection but not Covid-19. She is to rest, increase fluid and take OTC Motrin or Tylenol for fever or if not feeling well. To Follow up with her doctor as needed. Thank you Ellie Lunch PA-C

## 2019-08-31 ENCOUNTER — Ambulatory Visit: Payer: Self-pay | Attending: Internal Medicine

## 2019-08-31 DIAGNOSIS — Z23 Encounter for immunization: Secondary | ICD-10-CM

## 2019-08-31 NOTE — Progress Notes (Signed)
Covid-19 Vaccination Clinic  Name:  Caitlyn Roth    MRN: [id number] DOB: January 29, 1966  08/31/2019  [name] was observed post Covid-19 immunization for 15 minutes without incident. She was provided with Vaccine Information Sheet and instruction to access the V-Safe system.   [name] was instructed to call 911 with any severe reactions post vaccine: Marland Kitchen Difficulty breathing  . Swelling of face and throat  . A fast heartbeat  . A bad rash all over body  . Dizziness and weakness   Immunizations Administered    Name Date Dose VIS Date Route   Pfizer COVID-19 Vaccine 08/31/2019  3:14 PM 0.3 mL 04/30/2019 Intramuscular   Manufacturer: [employer], [employer]   Lot: [id number]   NDC: [address]-7

## 2020-01-03 ENCOUNTER — Telehealth: Payer: Self-pay | Admitting: *Deleted

## 2020-01-03 ENCOUNTER — Ambulatory Visit
Admission: EM | Admit: 2020-01-03 | Discharge: 2020-01-03 | Disposition: A | Discharge status: Home / Self Care | Payer: BC Managed Care – PPO | Attending: Emergency Medicine | Admitting: Emergency Medicine

## 2020-01-03 ENCOUNTER — Other Ambulatory Visit: Payer: Self-pay

## 2020-01-03 DIAGNOSIS — B349 Viral infection, unspecified: Secondary | ICD-10-CM

## 2020-01-03 NOTE — Telephone Encounter (Signed)
Caitlyn Roth called [name] asking about rapid Covid test. States she has sinus symptoms, chest congestion, nausea, and diarrhea and went to an urgent care and got a Covid PCR test. She wants to get a rapid Covid test to go back to work. [name] a rapid test would not change anything, she needs to stay in quarantine with her symptoms until her PCR Covid comes back. Further advised even if her PCR is negative, she needs to stay at home until she has no fever for atleast 48 hours without medication, and no diarrhea, and sinus symptom improvement, because even with the masks she can still get other people sick. [name] verbalizes understanding and states she will stay at home following these guidelines.

## 2020-01-03 NOTE — Discharge Instructions (Signed)
Your COVID test is pending.  You should self quarantine until the test result is back.    Take Tylenol as needed for fever or discomfort.  Rest and keep yourself hydrated.    Go to the emergency department if you develop acute worsening symptoms.     

## 2020-01-03 NOTE — ED Provider Notes (Signed)
Roderic Palau    CSN: [id number] Arrival date & time: 01/03/20  0802      History   Chief Complaint Chief Complaint  Patient presents with  . Otalgia  . Nasal Congestion  . Chest congestion  . Nausea    HPI Caitlyn Roth is a 54 y.o. female.   Patient presents with fatigue, nasal congestion, nonproductive cough, ear ache, sore throat, nausea, diarrhea x3 days.  She denies fever, shortness of breath, abdominal pain, vomiting, or rash, or other symptoms.  Treatment attempted at home with Tylenol.  She was seen by her PCP on 12/07/2019; diagnosed with acute bronchitis; treated with doxycycline and Tessalon Perles.  Patient's medical history includes CVA, MI, diabetes, hypertension.  The history is provided by the patient.    Past Medical History:  Diagnosis Date  . Anxiety   . Asthma   . Diabetes mellitus without complication ([place])   . Family history of adverse reaction to anesthesia    dad had difficulty waking up from anesthesia  . GERD (gastroesophageal reflux disease)   . Graves disease    Graves disease  . Hyperlipemia   . Hypertension   . Myocardial infarction ([place]) 01/2014  . Pre-diabetes   . Stroke [facility]) 2009   peripheral vision in left eye has been affected,    Patient Active Problem List   Diagnosis Date Noted  . Mixed hyperlipidemia 01/29/2017  . Vitamin D deficiency 01/29/2017  . Biceps tendinitis of right upper extremity 08/02/2016  . Complete tear of right rotator cuff 08/02/2016  . Arteriosclerotic coronary artery disease 02/07/2015  . Cerebral infarction ([place]) 11/03/2014  . Eczema 11/03/2014  . Hypertension 11/03/2014  . Hyperthyroidism 11/03/2014  . Migraine 11/03/2014  . Hypercholesterolemia 11/11/2007  . Allergic rhinitis 08/12/2007  . Barrett's esophagus without dysplasia 08/12/2007  . Gastro-esophageal reflux disease without esophagitis 08/12/2007    Past Surgical History:  Procedure Laterality Date  . CARDIAC CATHETERIZATION   2015   stents x 1  . cardiac stents    . CESAREAN SECTION    . CHOLECYSTECTOMY    . CORONARY ANGIOPLASTY  2016  . KNEE ARTHROSCOPY    . KNEE SURGERY Left 1990   McKay's procedure  . SHOULDER ARTHROSCOPY WITH OPEN ROTATOR CUFF REPAIR Right 10/01/2016   Procedure: SHOULDER ARTHROSCOPY WITH OPEN ROTATOR CUFF REPAIR;  Surgeon: Corky Mull, MD;  Location: [facility] ORS;  Service: Orthopedics;  Laterality: Right;  Shoulder Block   . SINUS SURGERY WITH INSTATRAK    . TONSILLECTOMY  1978  . TUBAL LIGATION      OB History   No obstetric history on file.      Home Medications    Prior to Admission medications   Medication Sig Start Date End Date Taking? Authorizing Provider  amphetamine-dextroamphetamine (ADDERALL XR) 30 MG 24 hr capsule Take 30 mg by mouth. 02/19/14   [provider]  atorvastatin (LIPITOR) 80 MG tablet Take 1 tablet by mouth daily. 04/22/15   [provider]  Clopidogrel Bisulfate (PLAVIX PO) Take 80 mg by mouth daily.    [provider]  Cyanocobalamin (B-12) 1000 MCG/ML KIT Inject 1 mL as directed once a week.    [provider]  desloratadine (CLARINEX) 5 MG tablet Take 5 mg by mouth daily.     [provider]  docusate sodium (COLACE) 100 MG capsule Take 100 mg by mouth daily.    [provider]  DULERA 200-5 MCG/ACT AERO Inhale 2 puffs into  the lungs 2 (two) times daily. 05/04/15   [provider]  DUREZOL 0.05 % EMUL Place 1 drop into the right eye 2 (two) times daily. 05/18/15   [provider]  esomeprazole (NEXIUM) 40 MG capsule Take 40 mg by mouth at bedtime.     [provider]  lisinopril (PRINIVIL,ZESTRIL) 2.5 MG tablet Take 1 tablet by mouth daily. 04/22/15   [provider]  methimazole (TAPAZOLE) 5 MG tablet Take 2.5 mg by mouth daily.    [provider]  metoprolol succinate (TOPROL-XL) 50 MG 24 hr tablet Take 50 mg by mouth daily. Take with or immediately  following a meal.    [provider]  montelukast (SINGULAIR) 10 MG tablet Take 10 mg by mouth at bedtime.    [provider]  oxyCODONE (ROXICODONE) 5 MG immediate release tablet Take 1-2 tablets (5-10 mg total) by mouth every 4 (four) hours as needed for severe pain. 10/01/16   Poggi, Marshall Cork, MD  Probiotic Product (PROBIOTIC DAILY PO) Take 1 capsule by mouth daily.    [provider]  venlafaxine XR (EFFEXOR-XR) 150 MG 24 hr capsule Take 75 mg by mouth daily with breakfast. 3 capsules    [provider]    Family History Family History  Problem Relation Age of Onset  . Breast cancer Maternal Aunt 60    Social History Social History   Tobacco Use  . Smoking status: Current Every Day Smoker    Packs/day: 0.25    Years: 35.00    Pack years: 8.75    Types: Cigarettes    Last attempt to quit: 01/27/2015    Years since quitting: 4.9  . Smokeless tobacco: Never Used  Substance Use Topics  . Alcohol use: Yes  . Drug use: No     Allergies   Cefdinir, Lac bovis, Prednisone, Acyclovir, Codeine, and Other   Review of Systems Review of Systems  Constitutional: Positive for fatigue. Negative for chills and fever.  HENT: Positive for congestion, ear pain and sore throat.   Eyes: Negative for pain and visual disturbance.  Respiratory: Positive for cough. Negative for shortness of breath.   Cardiovascular: Negative for chest pain and palpitations.  Gastrointestinal: Positive for diarrhea and nausea. Negative for abdominal pain and vomiting.  Genitourinary: Negative for dysuria and hematuria.  Musculoskeletal: Negative for arthralgias and back pain.  Skin: Negative for color change and rash.  Neurological: Negative for seizures and syncope.  All other systems reviewed and are negative.    Physical Exam Triage Vital Signs ED Triage Vitals  Enc Vitals Group     BP      Pulse      Resp      Temp      Temp src      SpO2      Weight       Height      Head Circumference      Peak Flow      Pain Score      Pain Loc      Pain Edu?      Excl. in [place]?    No data found.  Updated Vital Signs BP 134/87   Pulse 68   Temp 98.3 F (36.8 C)   Resp 17   LMP 11/27/2014   SpO2 97%   Visual Acuity Right Eye Distance:   Left Eye Distance:   Bilateral Distance:    Right Eye Near:   Left Eye Near:  Bilateral Near:     Physical Exam Vitals and nursing note reviewed.  Constitutional:      General: She is not in acute distress.    Appearance: She is well-developed. She is not ill-appearing.  HENT:     Head: Normocephalic and atraumatic.     Right Ear: Tympanic membrane normal.     Left Ear: Tympanic membrane normal.     Nose: Nose normal.     Mouth/Throat:     Mouth: Mucous membranes are moist.     Pharynx: Oropharynx is clear.  Eyes:     Conjunctiva/sclera: Conjunctivae normal.  Cardiovascular:     Rate and Rhythm: Normal rate and regular rhythm.     Heart sounds: No murmur heard.   Pulmonary:     Effort: Pulmonary effort is normal. No respiratory distress.     Breath sounds: Normal breath sounds. No wheezing or rhonchi.  Abdominal:     Palpations: Abdomen is soft.     Tenderness: There is no abdominal tenderness. There is no guarding or rebound.  Musculoskeletal:     Cervical back: Neck supple.  Skin:    General: Skin is warm and dry.     Findings: No rash.  Neurological:     General: No focal deficit present.     Mental Status: She is alert and oriented to person, place, and time.     Gait: Gait normal.  Psychiatric:        Mood and Affect: Mood normal.        Behavior: Behavior normal.      UC Treatments / Results  Labs (all labs ordered are listed, but only abnormal results are displayed) Labs Reviewed  NOVEL CORONAVIRUS, NAA    EKG   Radiology No results found.  Procedures Procedures (including critical care time)  Medications Ordered in UC Medications - No data to  display  Initial Impression / Assessment and Plan / UC Course  I have reviewed the triage vital signs and the nursing notes.  Pertinent labs & imaging results that were available during my care of the patient were reviewed by me and considered in my medical decision making (see chart for details).   Viral illness.  COVID test performed here.  Instructed patient to self quarantine until the test result is back.  Discussed with patient that she can take Tylenol as needed for fever or discomfort.  Instructed patient to go to the emergency department if she develops high fever, shortness of breath, severe diarrhea, or other concerning symptoms.  Patient agrees with plan of care.     Final Clinical Impressions(s) / UC Diagnoses   Final diagnoses:  Viral illness     Discharge Instructions     Your COVID test is pending.  You should self quarantine until the test result is back.    Take Tylenol as needed for fever or discomfort.  Rest and keep yourself hydrated.    Go to the emergency department if you develop acute worsening symptoms.        ED Prescriptions    None     PDMP not reviewed this encounter.   Sharion Balloon, NP 01/03/20 [phone]

## 2020-01-03 NOTE — ED Triage Notes (Signed)
Patient complains of diarrhea, nausea, chest and head congestion, and generalized malaise. Request COVID testing.

## 2020-01-05 LAB — SARS-COV-2, NAA 2 DAY TAT

## 2020-01-05 LAB — NOVEL CORONAVIRUS, NAA: SARS-CoV-2, NAA: NOT DETECTED

## 2020-05-01 ENCOUNTER — Other Ambulatory Visit: Payer: Self-pay | Admitting: Neurology

## 2020-05-01 ENCOUNTER — Other Ambulatory Visit (HOSPITAL_COMMUNITY): Payer: Self-pay | Admitting: Neurology

## 2020-05-01 DIAGNOSIS — M542 Cervicalgia: Secondary | ICD-10-CM

## 2020-05-08 ENCOUNTER — Ambulatory Visit
Admission: RE | Admit: 2020-05-08 | Discharge: 2020-05-08 | Disposition: A | Discharge status: Home / Self Care | Payer: BC Managed Care – PPO | Source: Ambulatory Visit | Attending: Neurology | Admitting: Neurology

## 2020-05-08 ENCOUNTER — Other Ambulatory Visit: Payer: Self-pay

## 2020-05-08 DIAGNOSIS — M542 Cervicalgia: Secondary | ICD-10-CM | POA: Diagnosis not present

## 2020-10-07 IMAGING — CR DG CHEST 2V
2 series · 2 of 2 positions shown · non-contrast
Comparison: 10/28/2017.

CLINICAL DATA: Chest, left shoulder and left jaw pain since 9 a.m.
today. Palpitations. Hypertension.

EXAM:
CHEST - 2 VIEW

[chest pa]
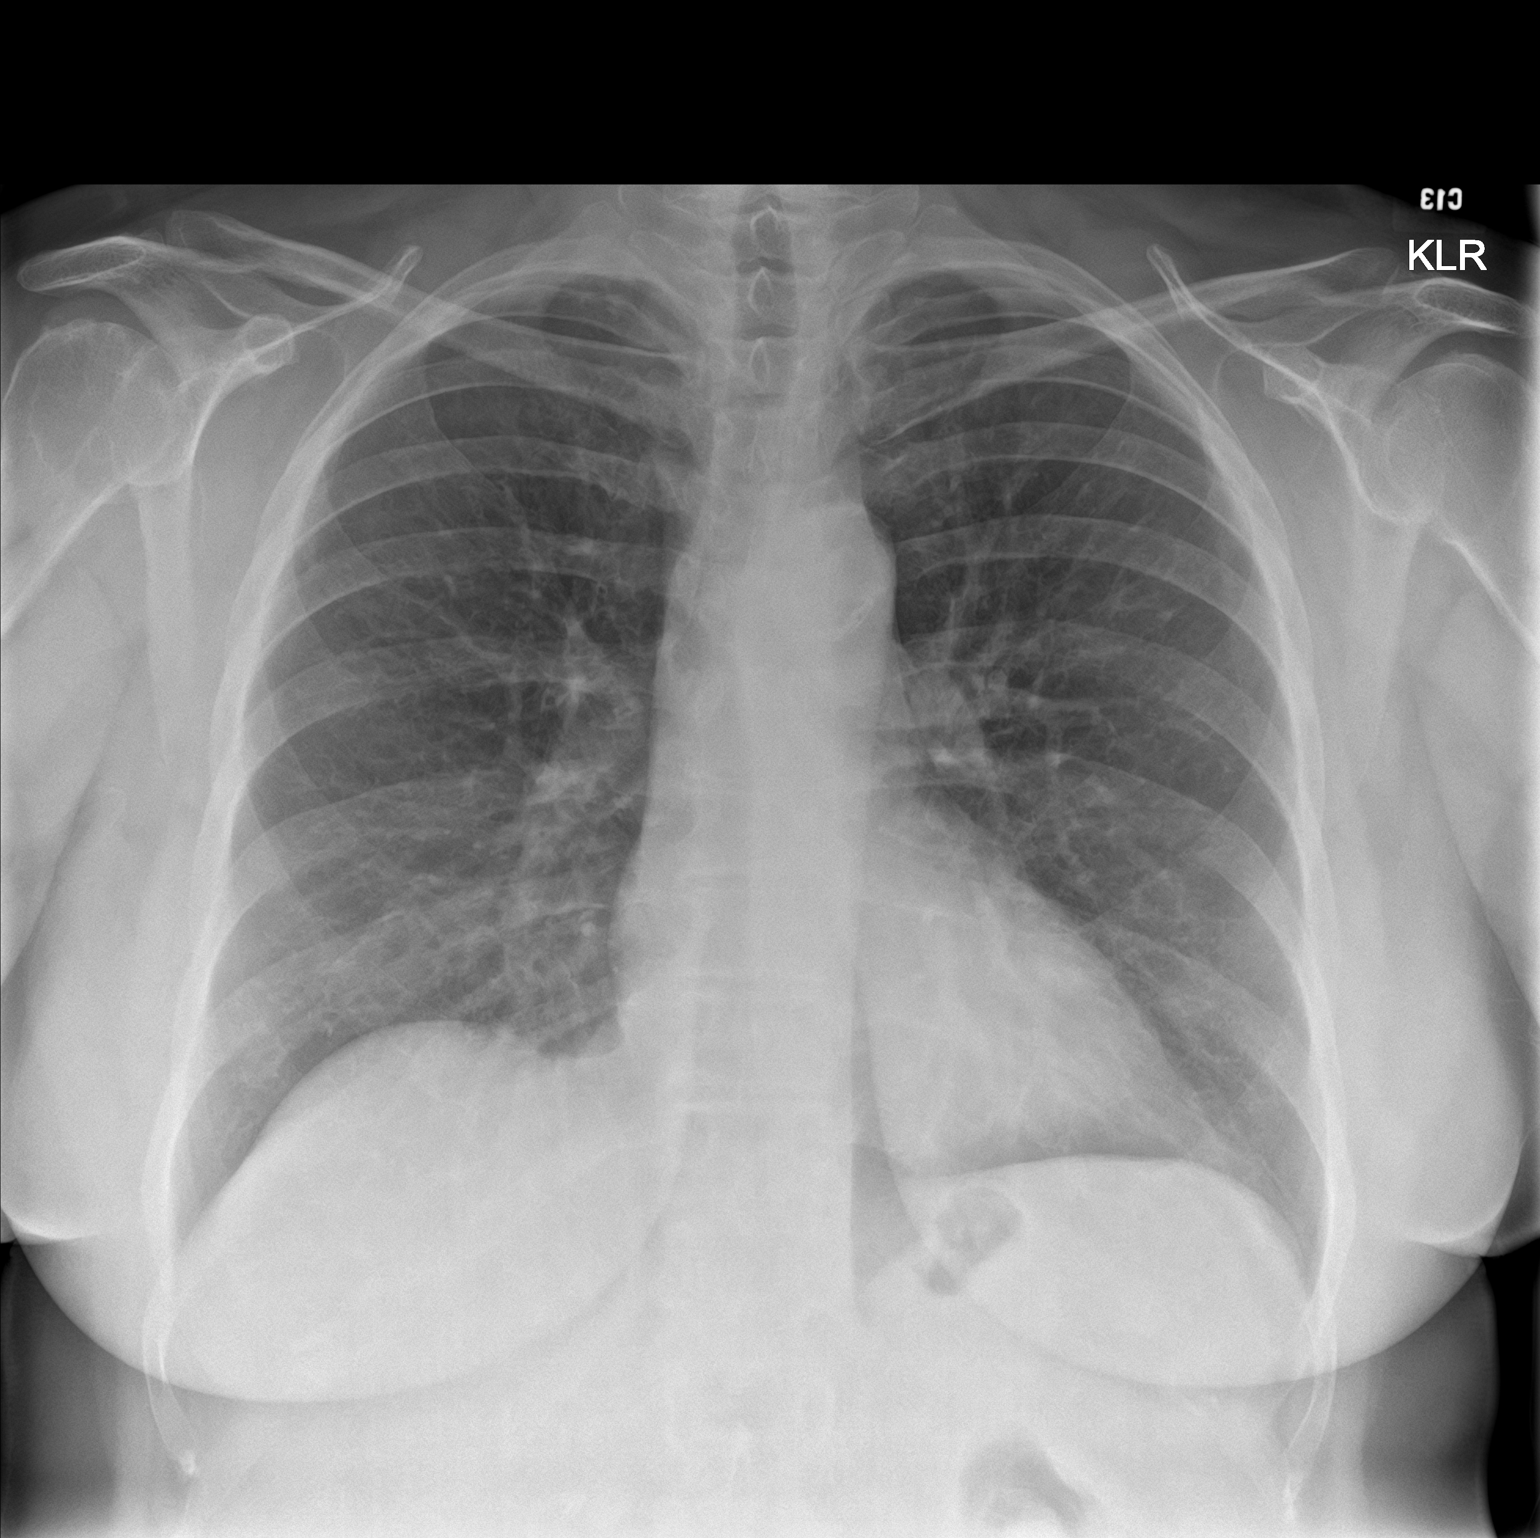

[chest lat]
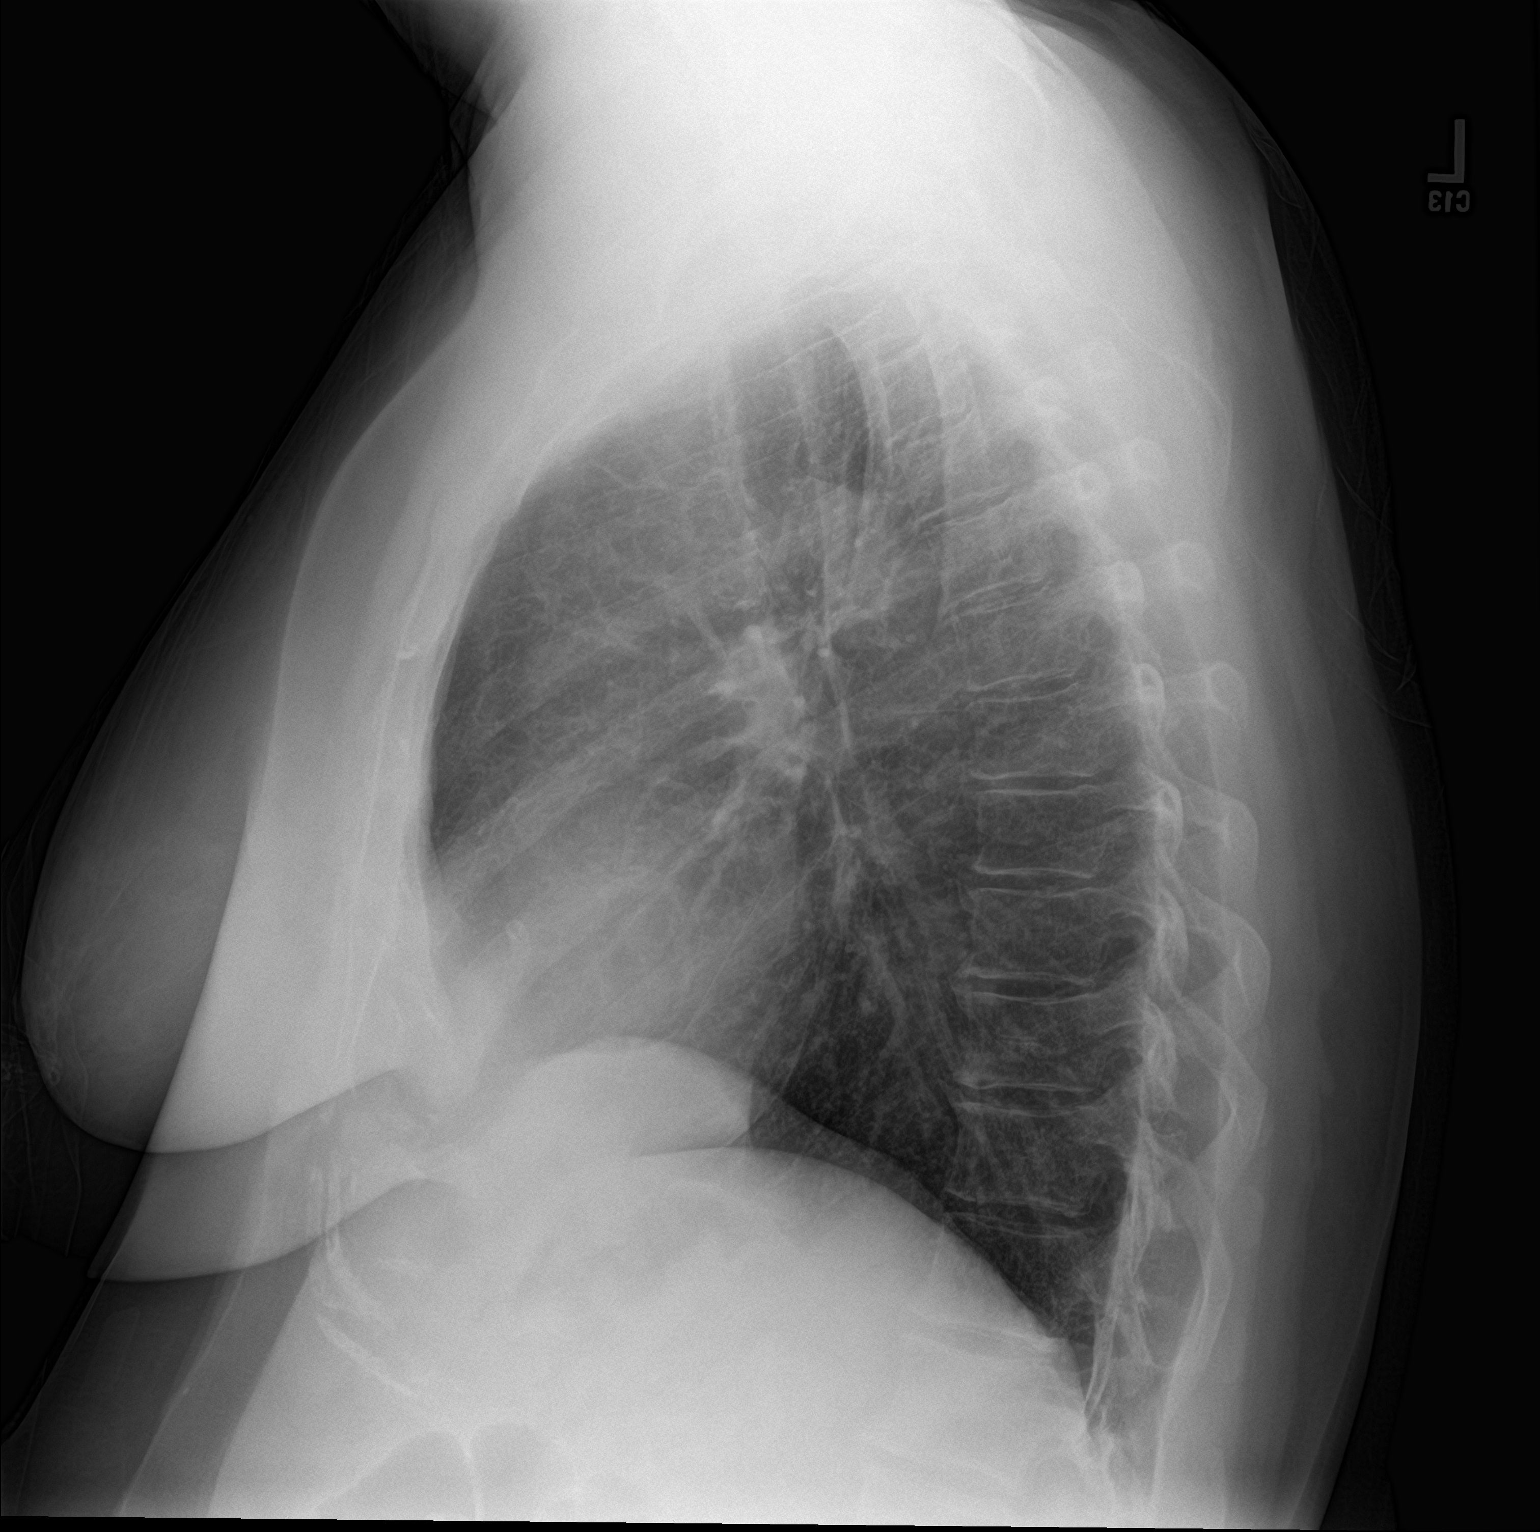

[2 of 2 positions shown; findings below may reference images not displayed]

FINDINGS: Normal sized heart. Clear lungs. Stable small nodular density in
both upper lung zones. Normal vascularity. Mild lower thoracic spine
degenerative changes. Coronary artery stent.
IMPRESSION: No acute abnormality.

## 2020-10-11 ENCOUNTER — Other Ambulatory Visit: Payer: Self-pay | Admitting: Physician Assistant

## 2020-10-11 DIAGNOSIS — R7989 Other specified abnormal findings of blood chemistry: Secondary | ICD-10-CM

## 2020-10-18 ENCOUNTER — Ambulatory Visit: Payer: BC Managed Care – PPO

## 2020-10-18 ENCOUNTER — Telehealth: Payer: BC Managed Care – PPO | Admitting: Nurse Practitioner

## 2020-10-18 ENCOUNTER — Other Ambulatory Visit: Payer: Self-pay

## 2020-10-18 DIAGNOSIS — Z20822 Contact with and (suspected) exposure to covid-19: Secondary | ICD-10-CM

## 2020-10-18 DIAGNOSIS — Z1152 Encounter for screening for COVID-19: Secondary | ICD-10-CM

## 2020-10-18 LAB — POC COVID19 BINAXNOW: SARS Coronavirus 2 Ag: NEGATIVE

## 2020-10-18 NOTE — Progress Notes (Signed)
55 year old female presenting to [facility] clinic today for COVID testing.   She has been vaccinated and boosted against COVID-19.  She has had a chronic cough and mild congestion that is a typical symptom for her at this time of year.    She would like testing due to her husband's immunocompromised status and he scheduled to come home from the hospital today.   Recent Results (from the past 2160 hour(s))  POC COVID-19     Status: Normal   Collection Time: 10/18/20  3:28 PM  Result Value Ref Range   SARS Coronavirus 2 Ag Negative Negative    Comment: Pt vaccinated x 2, boosted x 1. Has mild congestion and cough x 2 weeks, Aware of negative POC results. Has a phone appt with Lavone Neri FNP for further eval.      Will send PCR for patient and follow up with results when available.

## 2020-10-19 ENCOUNTER — Telehealth: Payer: Self-pay

## 2020-10-19 LAB — NOVEL CORONAVIRUS, NAA: SARS-CoV-2, NAA: NOT DETECTED

## 2020-10-19 LAB — SARS-COV-2, NAA 2 DAY TAT

## 2020-10-19 NOTE — Telephone Encounter (Signed)
Called patient on mobile phone number. Advised her that her PCR covid test was negative. Patient was relieved and appreciative.

## 2020-12-07 ENCOUNTER — Other Ambulatory Visit: Payer: Self-pay

## 2020-12-07 ENCOUNTER — Ambulatory Visit
Admission: RE | Admit: 2020-12-07 | Discharge: 2020-12-07 | Disposition: A | Discharge status: Home / Self Care | Payer: BC Managed Care – PPO | Source: Ambulatory Visit | Attending: Physician Assistant | Admitting: Physician Assistant

## 2020-12-07 DIAGNOSIS — R7989 Other specified abnormal findings of blood chemistry: Secondary | ICD-10-CM

## 2021-02-07 ENCOUNTER — Ambulatory Visit: Payer: BC Managed Care – PPO

## 2021-02-07 ENCOUNTER — Other Ambulatory Visit: Payer: Self-pay

## 2021-02-07 DIAGNOSIS — Z23 Encounter for immunization: Secondary | ICD-10-CM

## 2022-05-06 ENCOUNTER — Other Ambulatory Visit: Payer: Self-pay | Admitting: Family Medicine

## 2022-05-06 DIAGNOSIS — M25512 Pain in left shoulder: Secondary | ICD-10-CM

## 2022-06-11 ENCOUNTER — Other Ambulatory Visit: Payer: Self-pay | Admitting: Surgery

## 2022-06-17 ENCOUNTER — Inpatient Hospital Stay: Admission: RE | Admit: 2022-06-17 | Payer: BC Managed Care – PPO | Source: Ambulatory Visit

## 2022-06-25 ENCOUNTER — Ambulatory Visit: Admit: 2022-06-25 | Payer: BC Managed Care – PPO | Admitting: Surgery

## 2022-06-25 SURGERY — CARPOMETACARPAL (CMC) FUSION OF THUMB
Anesthesia: Choice | Site: Thumb | Laterality: Left

## 2022-07-24 ENCOUNTER — Other Ambulatory Visit: Payer: Self-pay | Admitting: Surgery

## 2022-07-30 ENCOUNTER — Encounter
Admission: RE | Admit: 2022-07-30 | Discharge: 2022-07-30 | Disposition: A | Discharge status: Home / Self Care | Payer: BC Managed Care – PPO | Source: Ambulatory Visit | Attending: Surgery | Admitting: Surgery

## 2022-07-30 ENCOUNTER — Other Ambulatory Visit: Payer: Self-pay

## 2022-07-30 VITALS — Ht 69.0 in | Wt 174.0 lb

## 2022-07-30 DIAGNOSIS — E119 Type 2 diabetes mellitus without complications: Secondary | ICD-10-CM

## 2022-07-30 DIAGNOSIS — E782 Mixed hyperlipidemia: Secondary | ICD-10-CM

## 2022-07-30 DIAGNOSIS — I10 Essential (primary) hypertension: Secondary | ICD-10-CM

## 2022-07-30 HISTORY — DX: Deficiency of other specified B group vitamins: E53.8

## 2022-07-30 HISTORY — DX: Sleep apnea, unspecified: G47.30

## 2022-07-30 HISTORY — DX: Unspecified osteoarthritis, unspecified site: M19.90

## 2022-07-30 HISTORY — DX: Unspecified atrial fibrillation: I48.91

## 2022-07-30 NOTE — Patient Instructions (Addendum)
Your procedure is scheduled on: Wednesday 08/07/22 To find out your arrival time, please call [phone] between [place] on:   Tuesday 08/06/22 Report to the Registration Desk on the 1st floor of the [facility]. Valet parking is available.  If your arrival time is 6:00 am, do not arrive before that time as the [place] entrance doors do not open until 6:00 am.  REMEMBER: Instructions that are not followed completely may result in serious medical risk, up to and including death; or upon the discretion of your surgeon and anesthesiologist your surgery may need to be rescheduled.  Do not eat food after midnight the night before surgery.  No gum chewing or hard candies.  You may however, drink CLEAR liquids up to 2 hours before you are scheduled to arrive for your surgery. Do not drink anything within 2 hours of your scheduled arrival time.  Type 1 and Type 2 diabetics should only drink water.  In addition, your doctor has ordered for you to drink the provided:    Gatorade G2 Drinking this  up to two hours before surgery helps to reduce insulin resistance and improve patient outcomes. Please complete drinking 2 hours before scheduled arrival time.  One week prior to surgery: Stop Anti-inflammatories (NSAIDS) such as Advil, Aleve, Ibuprofen, Motrin, Naproxen, Naprosyn and Aspirin based products such as Excedrin, Goody's Powder, BC Powder. You may however, continue to take Tylenol if needed for pain up until the day of surgery.  Stop ANY OVER THE COUNTER supplements until after surgery.  Continue taking all prescribed medications with the exception of the following: Jardiance, hold for 3 days, last dose will be Saturday 02/03/23.  Follow recommendations from Cardiologist or PCP regarding stopping blood thinners. Per Dr Roland Rack you can continue your Aspirin. Hold the day of surgery only.  TAKE ONLY THESE MEDICATIONS THE MORNING OF SURGERY WITH A SIP OF WATER:  busPIRone (BUSPAR) 10  MG tablet  pantoprazole (PROTONIX) 40 MG tablet   Antacid (take one the night before and one on the morning of surgery - helps to prevent nausea after surgery.) venlafaxine XR (EFFEXOR-XR) 225 MG 24 hr capsule   Use inhalers on the day of surgery and bring to the hospital.  No Alcohol for 24 hours before or after surgery. This will help open up your lungs.  No Smoking including e-cigarettes for 24 hours before surgery.  No chewable tobacco products for at least 6 hours before surgery.  No nicotine patches on the day of surgery.  Do not use any "recreational" drugs for at least a week (preferably 2 weeks) before your surgery.  Please be advised that the combination of cocaine and anesthesia may have negative outcomes, up to and including death. If you test positive for cocaine, your surgery will be cancelled.  On the morning of surgery brush your teeth with toothpaste and water, you may rinse your mouth with mouthwash if you wish. Do not swallow any toothpaste or mouthwash.  Use CHG Soap or wipes as directed on instruction sheet.  Do not wear lotions, powders, or perfumes. No deodorant.  Do not shave body hair from the neck down 48 hours before surgery.  Wear comfortable clothing (specific to your surgery type) to the hospital.  Do not wear jewelry, make-up, hairpins, clips or nail polish.  Contact lenses, hearing aids and dentures may not be worn into surgery.  Do not bring valuables to the hospital. [facility] is not responsible for any missing/lost belongings or  valuables.   Notify your doctor if there is any change in your medical condition (cold, fever, infection).  If you are being discharged the day of surgery, you will not be allowed to drive home. You will need a responsible individual to drive you home and stay with you for 24 hours after surgery.   If you are taking public transportation, you will need to have a responsible individual with you.  If you are being  admitted to the hospital overnight, leave your suitcase in the car. After surgery it may be brought to your room.  In case of increased patient census, it may be necessary for you, the patient, to continue your postoperative care in the Same Day Surgery department.  After surgery, you can help prevent lung complications by doing breathing exercises.  Take deep breaths and cough every 1-2 hours. Your doctor may order a device called an Incentive Spirometer to help you take deep breaths. When coughing or sneezing, hold a pillow firmly against your incision with both hands. This is called "splinting." Doing this helps protect your incision. It also decreases belly discomfort.  Surgery Visitation Policy:  Patients undergoing a surgery or procedure may have two family members or support persons with them as long as the person is not COVID-19 positive or experiencing its symptoms.   Inpatient Visitation:    Visiting hours are 7 a.m. to 8 p.m. Up to four visitors are allowed at one time in a patient room. The visitors may rotate out with other people during the day. One designated support person (adult) may remain overnight.  Due to an increase in RSV and influenza rates and associated hospitalizations, children ages 85 and under will not be able to visit patients in [facility]. Masks continue to be strongly recommended.  Please call the [facility] [facility] at [phone] if you have any questions about these instructions.      Preparing for Surgery with CHLORHEXIDINE GLUCONATE (CHG) Soap  Chlorhexidine Gluconate (CHG) Soap  o An antiseptic cleaner that kills germs and bonds with the skin to continue killing germs even after washing  o Used for showering the night before surgery and morning of surgery  Before surgery, you can play an important role by reducing the number of germs on your skin.  CHG (Chlorhexidine gluconate) soap is an antiseptic cleanser which kills  germs and bonds with the skin to continue killing germs even after washing.  Please do not use if you have an allergy to CHG or antibacterial soaps. If your skin becomes reddened/irritated stop using the CHG.  1. Shower the NIGHT BEFORE SURGERY and the MORNING OF SURGERY with CHG soap.  2. If you choose to wash your hair, wash your hair first as usual with your normal shampoo.  3. After shampooing, rinse your hair and body thoroughly to remove the shampoo.  4. Use CHG as you would any other liquid soap. You can apply CHG directly to the skin and wash gently with a scrungie or a clean washcloth.  5. Apply the CHG soap to your body only from the neck down. Do not use on open wounds or open sores. Avoid contact with your eyes, ears, mouth, and genitals (private parts). Wash face and genitals (private parts) with your normal soap.  6. Wash thoroughly, paying special attention to the area where your surgery will be performed.  7. Thoroughly rinse your body with warm water.  8. Do not shower/wash with your normal soap after  using and rinsing off the CHG soap.  9. Pat yourself dry with a clean towel.  10. Wear clean pajamas to bed the night before surgery.  12. Place clean sheets on your bed the night of your first shower and do not sleep with pets.  13. Shower again with the CHG soap on the day of surgery prior to arriving at the hospital.  14. Do not apply any deodorants/lotions/powders.  15. Please wear clean clothes to the hospital.

## 2022-07-31 ENCOUNTER — Encounter: Payer: Self-pay | Admitting: Urgent Care

## 2022-07-31 ENCOUNTER — Encounter
Admission: RE | Admit: 2022-07-31 | Discharge: 2022-07-31 | Disposition: A | Discharge status: Home / Self Care | Payer: BC Managed Care – PPO | Source: Ambulatory Visit | Attending: Surgery | Admitting: Surgery

## 2022-07-31 DIAGNOSIS — I10 Essential (primary) hypertension: Secondary | ICD-10-CM | POA: Insufficient documentation

## 2022-07-31 DIAGNOSIS — E782 Mixed hyperlipidemia: Secondary | ICD-10-CM | POA: Diagnosis not present

## 2022-07-31 DIAGNOSIS — I491 Atrial premature depolarization: Secondary | ICD-10-CM | POA: Insufficient documentation

## 2022-07-31 DIAGNOSIS — Z01810 Encounter for preprocedural cardiovascular examination: Secondary | ICD-10-CM | POA: Insufficient documentation

## 2022-08-01 ENCOUNTER — Encounter: Payer: Self-pay | Admitting: Surgery

## 2022-08-01 NOTE — Progress Notes (Signed)
Perioperative / Anesthesia Services  Pre-Admission Testing Clinical Review / Preoperative Anesthesia Consult  Date: 08/01/22  Patient Demographics:  Name: Caitlyn Roth DOB:   [birth year] MRN:   [id number]  Planned Surgical Procedure(s):    Case: [id number] Date/Time: 08/07/22 1123   Procedure: LEFT THUMB CMC ARTHROPLASTY (Left: Thumb)   Anesthesia type: Choice   Pre-op diagnosis:      CMC arthritis M19.049     Chronic pain of left hand M79.642, G89.29   Location: [facility] OR ROOM 02 / [facility]   Surgeons: Christena Flake, MD     NOTE: Available PAT nursing documentation and vital signs have been reviewed. Clinical nursing staff has updated patient's PMH/PSHx, current medication list, and drug allergies/intolerances to ensure comprehensive history available to assist in medical decision making as it pertains to the aforementioned surgical procedure and anticipated anesthetic course. Extensive review of available clinical information personally performed. [name] PMH and PSHx updated with any diagnoses/procedures that  may have been inadvertently omitted during her intake with the pre-admission testing department's nursing staff.  Clinical Discussion:  Caitlyn Roth is a 57 y.o. female who is submitted for pre-surgical anesthesia review and clearance prior to her undergoing the above procedure. Patient is a Current Smoker (17.5 pack years). Pertinent PMH includes: CAD, inferior STEMI, atrial fibrillation, diastolic dysfunction, CVA, HTN, HLD, T2DM, Graves' disease, GERD (on daily PPI), OSA, asthma, OA, diabetic polyneuropathy, anxiety, ADHD.  Patient is followed by cardiology Lady Gary, MD). She was last seen in the cardiology clinic on 05/08/2022; notes reviewed. At the time of her clinic visit, patient doing "fairly well overall from a cardiovascular perspective.  Patient with occasional episodes of tachycardia, however these were reported to be infrequent.  Patient mourning  the recent loss of her husband.  She denied any associated chest pain, shortness of breath, PND, orthopnea, significant peripheral edema, weakness, fatigue, vertiginous symptoms, or presyncope/syncope. Patient with a past medical history significant for cardiovascular diagnoses. Documented physical exam was grossly benign, providing no evidence of acute exacerbation and/or decompensation of the patient's known cardiovascular conditions.  Patient reported to have suffered a stroke and 2009.  Patient with LEFT peripheral vision changes as a late effect.  Patient suffered an inferior wall STEMI on 01/28/2015.    TTE performed on 01/28/2015 revealed a low normal left ventricular systolic function with an EF of 50%.  There was inferior and posterior hypokinesis. Left ventricular diastolic Doppler parameters consistent with abnormal relaxation (G1DD).  There was mild biatrial enlargement.  Trivial mitral and tricuspid valve regurgitation observed. All transvalvular gradients were noted to be normal providing no evidence suggestive of valvular stenosis.  Diagnostic LEFT heart catheterization was performed revealing a 70% stenosis of the proximal RCA.  PCI was subsequently performed placing a 3.5 x 20 mm Promus Premier DES to the proximal RCA.  Procedure yielded excellent angiographic result and TIMI-3 flow.  Most recent myocardial perfusion imaging study was performed on 08/21/2016 revealing normal left ventricular systolic function with an EF of 57%.  There was no evidence of stress-induced myocardial ischemia or arrhythmia; no scintigraphic evidence of scar.  Study determined to be normal and low risk.  Patient with an atrial fibrillation diagnosis; CHA2DS2-VASc Score = 6 (sex, HTN, CVA/TIA x 2, vascular disease history/prior MI, T2DM). Her rate and rhythm are currently being maintained on oral metoprolol succinate.  Patient is not currently on oral anticoagulation therapy.  Blood pressure mildly elevated at  140/90 mmHg on currently prescribed ACEi (  lisinopril) and beta-blocker (metoprolol succinate) therapies. She is on a rosuvastatin for her HLD diagnosis and further ASCVD prevention. T2DM well controlled on currently prescribed regimen; last HgbA1c was 6.5% when checked on 12/14/2021.  Of note, A1c level has been rechecked since patient was seen last by cardiology, with worsening of value to 7.8%.  In the setting of known cardiovascular disease and concurrent T2DM, patient on an SGLT2i (empagliflozin) for added cardiovascular/renovascular protection. Patient making efforts to remain active. Functional capacity, as defined by DASI, is documented as being >/= 4 METS.  No changes were made to her medication regimen.  Patient follow-up with outpatient cardiology in 6 months or sooner if needed.  Caitlyn Roth is scheduled for an elective LEFT THUMB CMC ARTHROPLASTY on 08/07/2022 with Dr. Leron Croak, MD.  Given patient's past medical history significant for cardiovascular diagnoses, presurgical cardiac clearance was sought by the PAT team. Despite multiple attempts to obtain clearance, no return communication.    In review of her medication reconciliation, it is noted that patient is currently on prescribed daily antiplatelet therapy.  Given her cardiovascular history, surgeon has cleared patient to continue her daily low-dose ASA throughout her perioperative course.  Patient denies previous perioperative complications with anesthesia in the past.  Patient did want to make mention of the a history of (+) delayed emergence in a first degree relative (father). In review of the available records, it is noted that patient underwent a MAC anesthetic course at [facility] ([facility]) in 05/2018 without documented complications.      07/30/2022   12:53 PM 01/03/2020    8:15 AM 02/19/2019    8:46 PM  Vitals with BMI  Height 5\' 9"     Weight 174 lbs    BMI 25.68    Systolic  134 126  Diastolic  87  87  Pulse  68 82    Providers/Specialists:   NOTE: Primary physician provider listed below. Patient may have been seen by APP or partner within same practice.   PROVIDER ROLE / SPECIALTY LAST OV  Poggi, Excell Seltzer, MD Orthopedics (Surgeon) 05/31/2022  Patrice Paradise, MD Primary Care Provider 06/03/2022  Harold Hedge, MD Cardiology 05/08/2022  Merri Ray, MD Physiatry 05/03/2022   Allergies:  Cefdinir, Milk (cow), Prednisone, Acyclovir, Codeine, and Other  Current Home Medications:   No current facility-administered medications for this encounter.    albuterol (VENTOLIN HFA) 108 (90 Base) MCG/ACT inhaler   amphetamine-dextroamphetamine (ADDERALL XR) 30 MG 24 hr capsule   aspirin EC 81 MG tablet   brimonidine (ALPHAGAN) 0.2 % ophthalmic solution   busPIRone (BUSPAR) 10 MG tablet   Cyanocobalamin (B-12) 1000 MCG/ML KIT   desloratadine (CLARINEX) 5 MG tablet   empagliflozin (JARDIANCE) 25 MG TABS tablet   gabapentin (NEURONTIN) 300 MG capsule   lisinopril (PRINIVIL,ZESTRIL) 2.5 MG tablet   loteprednol (LOTEMAX) 0.5 % ophthalmic suspension   methimazole (TAPAZOLE) 5 MG tablet   metoprolol succinate (TOPROL-XL) 50 MG 24 hr tablet   montelukast (SINGULAIR) 10 MG tablet   pantoprazole (PROTONIX) 40 MG tablet   rosuvastatin (CRESTOR) 10 MG tablet   timolol (BETIMOL) 0.5 % ophthalmic solution   venlafaxine XR (EFFEXOR-XR) 75 MG 24 hr capsule   History:   Past Medical History:  Diagnosis Date   A-fib (HCC)    a.) CHA2DS2VASc = 6 (sex, HTN, CVA/TIA x2,  vascular disease history, T2DM);  b.) rate/rhythm maintained on oral metoprolol succinate; no chronic anticoagulation   Acute ST elevation myocardial  infarction (STEMI) of inferior wall (HCC) 01/28/2015   a.) LHC/PCI 01/28/2015 --> 70% pRCA (3.5 x 20 mm Promus Premier)   ADHD (attention deficit hyperactivity disorder)    a.) on amphetamine-dextroamphetamine   Anxiety    Arthritis    Asthma    CAD (coronary artery  disease) 01/28/2015   a.) LHC/PCI 01/28/2015 --> 70% pRCA (3.5 x 20 mm Promus Premier DES)   Diabetic polyneuropathy (HCC)    Diastolic dysfunction 01/28/2015   a.) TTE 01/28/2015: EF 50, inf/post HK, triv MR/TR, G1DD   Eczema    Family history of adverse reaction to anesthesia    a.) delayed emergence in 1st degree relative (father)   [name] corneal dystrophy    GERD (gastroesophageal reflux disease)    Graves disease    a.) on methimazole   Hepatic steatosis    Hyperlipemia    Hypertension    Migraines    Right rotator cuff tendinitis    Sleep apnea    Stroke (HCC) 2009   peripheral vision in left eye has been affected,   T2DM (type 2 diabetes mellitus) (HCC)    Vitamin B 12 deficiency    Past Surgical History:  Procedure Laterality Date   CESAREAN SECTION     CHOLECYSTECTOMY     CORONARY ANGIOPLASTY WITH STENT PLACEMENT Left 01/28/2015   Procedure: CORONARY ANGIOPLASTY WITH STENT PLACEMENT; Location: [facility]   KNEE ARTHROSCOPY Left    KNEE SURGERY Left 1990   McKay's procedure   SHOULDER ARTHROSCOPY WITH OPEN ROTATOR CUFF REPAIR Right 10/01/2016   Procedure: SHOULDER ARTHROSCOPY WITH OPEN ROTATOR CUFF REPAIR;  Surgeon: Christena Flake, MD;  Location: [facility] ORS;  Service: Orthopedics;  Laterality: Right;  Shoulder Block    SINUS SURGERY WITH INSTATRAK     TONSILLECTOMY  1978   TUBAL LIGATION     Family History  Problem Relation Age of Onset   Breast cancer Maternal Aunt 29   Social History   Tobacco Use   Smoking status: Every Day    Packs/day: 0.50    Years: 35.00    Additional pack years: 0.00    Total pack years: 17.50    Types: Cigarettes   Smokeless tobacco: Never  Vaping Use   Vaping Use: Never used  Substance Use Topics   Alcohol use: Yes    Comment: rare socially   Drug use: No    Pertinent Clinical Results:  LABS:   Component Ref Range & Units 05/27/2022  WBC (White Blood Cell Count) 4.1 - 10.2 10^3/uL 13.5 High   RBC (Red Blood Cell  Count) 4.04 - 5.48 10^6/uL 4.91  Hemoglobin 12.0 - 15.0 gm/dL 78.2 High   Hematocrit 35.0 - 47.0 % 44.7  MCV (Mean Corpuscular Volume) 80.0 - 100.0 fl 91.0  MCH (Mean Corpuscular Hemoglobin) 27.0 - 31.2 pg 30.8  MCHC (Mean Corpuscular Hemoglobin Concentration) 32.0 - 36.0 gm/dL 95.6  Platelet Count 213 - 450 10^3/uL 286  RDW-CV (Red Cell Distribution Width) 11.6 - 14.8 % 12.3  MPV (Mean Platelet Volume) 9.4 - 12.4 fl 10.3  Neutrophils 1.50 - 7.80 10^3/uL 8.98 High   Lymphocytes 1.00 - 3.60 10^3/uL 3.08  Monocytes 0.00 - 1.50 10^3/uL 0.96  Eosinophils 0.00 - 0.55 10^3/uL 0.36  Basophils 0.00 - 0.09 10^3/uL 0.09  Neutrophil % 32.0 - 70.0 % 66.4  Lymphocyte % 10.0 - 50.0 % 22.8  Monocyte % 4.0 - 13.0 % 7.1  Eosinophil % 1.0 - 5.0 % 2.7  Basophil% 0.0 - 2.0 %  0.7  Immature Granulocyte % <=0.7 % 0.3  Immature Granulocyte Count <=0.06 10^3/L 0.04  Resulting Agency [facility]  Specimen Collected: 05/27/22 09:56   Performed by: [facility] Last Resulted: 05/27/22 10:32  Received From: [facility]  Result Received: 06/14/22 11:28    Ref Range & Units 05/27/2022  Glucose 70 - 110 mg/dL 696 High   Sodium 295 - 145 mmol/L 139  Potassium 3.6 - 5.1 mmol/L 4.3  Chloride 97 - 109 mmol/L 103  Carbon Dioxide (CO2) 22.0 - 32.0 mmol/L 28.8  Urea Nitrogen (BUN) 7 - 25 mg/dL 10  Creatinine 0.6 - 1.1 mg/dL 0.6  Glomerular Filtration Rate (eGFR) >60 mL/min/1.73sq m 105  Calcium 8.7 - 10.3 mg/dL 28.4  AST 8 - 39 U/L 18  ALT 5 - 38 U/L 27  Alk Phos (alkaline Phosphatase) 34 - 104 U/L 153 High   Albumin 3.5 - 4.8 g/dL 4.3  Bilirubin, Total 0.3 - 1.2 mg/dL 0.7  Protein, Total 6.1 - 7.9 g/dL 7.1  A/G Ratio 1.0 - 5.0 gm/dL 1.5  Resulting Agency [facility]  Specimen Collected: 05/27/22 09:56   Performed by: [facility] Last Resulted: 05/27/22 15:51  Received From: [facility]  Result Received: 06/14/22 11:28     Ref Range & Units 05/27/2022  Hemoglobin A1C 4.2 - 5.6 % 7.8 High   Average Blood Glucose (Calc) mg/dL 132  Resulting Agency [facility]  Normal Range:    4.2 - 5.6% Increased Risk:  5.7 - 6.4% Diabetes:        >= 6.5% Glycemic Control for adults with diabetes:  <7%   Specimen Collected: 05/27/22 09:56   Performed by: [facility] Last Resulted: 05/27/22 11:07  Received From: [facility]  Result Received: 06/14/22 11:28     ECG: Date: 07/31/2022 Time ECG obtained: 1437 PM Rate: 84 bpm Rhythm: normal sinus Axis (leads I and aVF): Normal Intervals: PR 146 ms. QRS 92 ms. QTc 446 ms. ST segment and T wave changes: No evidence of acute ST segment elevation or depression Comparison: Similar to previous tracing obtained on 02/19/2019   IMAGING / PROCEDURES: MR CERVICAL SPINE WO CONTRAST performed on 05/08/2020 Progressive multilevel spondylosis of the cervical spine as described. Progressive facet disease with acute edema at C2-3 on the left and C4-5 on the right. Right-sided facet disease could be related to the pain the patient has when turning to the right. Progressive moderate left foraminal narrowing at C2-3. Progressive moderate foraminal narrowing at C3-4 is worse on the left. Progressive moderate foraminal narrowing at C4-5 is worse on the right. Moderate foraminal narrowing bilaterally at C5-6 is stable. Progressive moderate left and mild right foraminal stenosis at C6-7. Progressive central canal stenosis at C6-7 with slight distortion of the ventral surface the cord but no abnormal signal.  MYOCARDIAL PERFUSION IMAGING STUDY (LEXISCAN) performed on 08/21/2016 Normal left ventricular systolic function with a normal LVEF of 57% Normal myocardial thickening and wall motion Left ventricular cavity size normal SPECT images demonstrate homogenous tracer distribution throughout  the myocardium No evidence of stress-induced myocardial ischemia or arrhythmia Normal low risk study  LONG TERM CARDIAC EVENT MONITOR STUDY performed on 09/06/2015 Predominant rhythm was sinus rhythm  The minimum heart rate was approximately 63 bpm  The maximum heart rate was approximately 134 bpm  The average heart rate was 89 bpm.  Approximately 3 premature ventricular beat (s) were noted  There were 0 ventricular run(s) noted   TRANSTHORACIC ECHOCARDIOGRAM performed on 01/28/2015 Normal left ventricular systolic function with an EF of 50% Inferior and posterior hypokinesis Left ventricular diastolic Doppler parameters consistent with abnormal relaxation (G1DD). Mild BAE Normal right ventricular systolic function Trivial MR and TR Normal transvalvular gradients; no valvular stenosis No pericardial effusion  Impression and Plan:  Caitlyn Roth has been referred for pre-anesthesia review and clearance prior to her undergoing the planned anesthetic and procedural courses. Available labs, pertinent testing, and imaging results were personally reviewed by me in preparation for upcoming operative/procedural course. [facility] medical record has been updated following extensive record review and patient interview with PAT staff.   Patient was submitted for clearance from cardiology, however despite best efforts, no return communication was received. Patient was doing well at her visit in 04/2022 with no complaints of angina or anginal equivalent symptoms. Last MPI in 2018 was normal. Based on clinical review performed today (08/01/22), barring any significant acute changes in the patient's overall condition, it is anticipated that she will be able to proceed with the planned surgical intervention. Any acute changes in clinical condition may necessitate her procedure being postponed and/or cancelled. Patient will meet with anesthesia team (MD and/or CRNA) on the day of her procedure for  preoperative evaluation/assessment. Questions regarding anesthetic course will be fielded at that time. Anesthesia team to review history and make final determination on whether or not patient is felt to be appropriate to proceed with planned intervention without official cardiology input.   Pre-surgical instructions were reviewed with the patient during her PAT appointment, and questions were fielded to satisfaction by PAT clinical staff. She has been instructed on which medications that she will need to hold prior to surgery, as well as the ones that have been deemed safe/appropriate to take of the day of her procedure. As part of the general education provided by PAT, patient made aware both verbally and in writing, that she would need to abstain from the use of any illegal substances during her perioperative course.  She was advised that failure to follow the provided instructions could necessitate case cancellation or result serious perioperative complications up to and including death. Patient encouraged to contact PAT and/or her surgeon's office to discuss any questions or concerns that may arise prior to surgery; verbalized understanding.   Quentin Mulling, MSN, APRN, FNP-C, CEN [facility]  Peri-operative Services Nurse Practitioner Phone: [phone] Fax: [phone] 08/01/22 9:18 AM  NOTE: This note has been prepared using Dragon dictation software. Despite my best ability to proofread, there is always the potential that unintentional transcriptional errors may still occur from this process.

## 2022-08-07 ENCOUNTER — Encounter: Payer: Self-pay | Admitting: Surgery

## 2022-08-07 ENCOUNTER — Other Ambulatory Visit: Payer: Self-pay

## 2022-08-07 ENCOUNTER — Ambulatory Visit: Payer: BC Managed Care – PPO | Admitting: Urgent Care

## 2022-08-07 ENCOUNTER — Ambulatory Visit: Payer: BC Managed Care – PPO

## 2022-08-07 ENCOUNTER — Ambulatory Visit
Admission: RE | Admit: 2022-08-07 | Discharge: 2022-08-07 | Disposition: A | Discharge status: Home / Self Care | Payer: BC Managed Care – PPO | Attending: Surgery | Admitting: Surgery

## 2022-08-07 ENCOUNTER — Encounter
Admission: RE | Disposition: A | Discharge status: Home / Self Care | Payer: Self-pay | Source: Home / Self Care | Attending: Surgery

## 2022-08-07 DIAGNOSIS — I252 Old myocardial infarction: Secondary | ICD-10-CM | POA: Insufficient documentation

## 2022-08-07 DIAGNOSIS — Z955 Presence of coronary angioplasty implant and graft: Secondary | ICD-10-CM | POA: Insufficient documentation

## 2022-08-07 DIAGNOSIS — J45909 Unspecified asthma, uncomplicated: Secondary | ICD-10-CM | POA: Insufficient documentation

## 2022-08-07 DIAGNOSIS — I10 Essential (primary) hypertension: Secondary | ICD-10-CM | POA: Diagnosis not present

## 2022-08-07 DIAGNOSIS — Z7984 Long term (current) use of oral hypoglycemic drugs: Secondary | ICD-10-CM | POA: Insufficient documentation

## 2022-08-07 DIAGNOSIS — E0500 Thyrotoxicosis with diffuse goiter without thyrotoxic crisis or storm: Secondary | ICD-10-CM | POA: Insufficient documentation

## 2022-08-07 DIAGNOSIS — Z833 Family history of diabetes mellitus: Secondary | ICD-10-CM | POA: Insufficient documentation

## 2022-08-07 DIAGNOSIS — Z8249 Family history of ischemic heart disease and other diseases of the circulatory system: Secondary | ICD-10-CM | POA: Insufficient documentation

## 2022-08-07 DIAGNOSIS — F17210 Nicotine dependence, cigarettes, uncomplicated: Secondary | ICD-10-CM | POA: Insufficient documentation

## 2022-08-07 DIAGNOSIS — K219 Gastro-esophageal reflux disease without esophagitis: Secondary | ICD-10-CM | POA: Insufficient documentation

## 2022-08-07 DIAGNOSIS — E119 Type 2 diabetes mellitus without complications: Secondary | ICD-10-CM

## 2022-08-07 DIAGNOSIS — Z8673 Personal history of transient ischemic attack (TIA), and cerebral infarction without residual deficits: Secondary | ICD-10-CM | POA: Insufficient documentation

## 2022-08-07 DIAGNOSIS — E1142 Type 2 diabetes mellitus with diabetic polyneuropathy: Secondary | ICD-10-CM | POA: Insufficient documentation

## 2022-08-07 DIAGNOSIS — I2510 Atherosclerotic heart disease of native coronary artery without angina pectoris: Secondary | ICD-10-CM | POA: Diagnosis not present

## 2022-08-07 DIAGNOSIS — M1812 Unilateral primary osteoarthritis of first carpometacarpal joint, left hand: Secondary | ICD-10-CM | POA: Insufficient documentation

## 2022-08-07 HISTORY — DX: Dermatitis, unspecified: L30.9

## 2022-08-07 HISTORY — DX: Type 2 diabetes mellitus without complications: E11.9

## 2022-08-07 HISTORY — PX: CARPOMETACARPAL (CMC) FUSION OF THUMB: SHX6290

## 2022-08-07 HISTORY — DX: Other shoulder lesions, right shoulder: M75.81

## 2022-08-07 HISTORY — DX: Type 2 diabetes mellitus with diabetic polyneuropathy: E11.42

## 2022-08-07 HISTORY — DX: Fatty (change of) liver, not elsewhere classified: K76.0

## 2022-08-07 HISTORY — DX: Migraine, unspecified, not intractable, without status migrainosus: G43.909

## 2022-08-07 HISTORY — DX: Endothelial corneal dystrophy, unspecified eye: H18.519

## 2022-08-07 HISTORY — DX: Attention-deficit hyperactivity disorder, unspecified type: F90.9

## 2022-08-07 LAB — GLUCOSE, CAPILLARY
Glucose-Capillary: 219 mg/dL — ABNORMAL HIGH (ref 70–99)
Glucose-Capillary: 229 mg/dL — ABNORMAL HIGH (ref 70–99)

## 2022-08-07 SURGERY — CARPOMETACARPAL (CMC) FUSION OF THUMB
Anesthesia: General | Site: Thumb | Laterality: Left

## 2022-08-07 MED ORDER — PROPOFOL 10 MG/ML IV BOLUS
INTRAVENOUS | Status: AC
  Filled 2022-08-07: qty 20

## 2022-08-07 MED ORDER — DEXMEDETOMIDINE HCL IN NACL 80 MCG/20ML IV SOLN
INTRAVENOUS | Status: DC | PRN
  Administered 2022-08-07: 4 ug via BUCCAL
  Administered 2022-08-07: 6 ug via BUCCAL

## 2022-08-07 MED ORDER — ONDANSETRON HCL 4 MG PO TABS: 4.0000 mg | ORAL_TABLET | Freq: Four times a day (QID) | ORAL | Status: DC | PRN

## 2022-08-07 MED ORDER — METOCLOPRAMIDE HCL 5 MG/ML IJ SOLN: 5.0000 mg | Freq: Three times a day (TID) | INTRAMUSCULAR | Status: DC | PRN

## 2022-08-07 MED ORDER — FENTANYL CITRATE (PF) 100 MCG/2ML IJ SOLN: 25.0000 ug | INTRAMUSCULAR | Status: DC | PRN

## 2022-08-07 MED ORDER — GLYCOPYRROLATE 0.2 MG/ML IJ SOLN
INTRAMUSCULAR | Status: DC | PRN
  Administered 2022-08-07: .1 mg via INTRAVENOUS

## 2022-08-07 MED ORDER — ONDANSETRON HCL 4 MG/2ML IJ SOLN
INTRAMUSCULAR | Status: DC | PRN
  Administered 2022-08-07: 4 mg via INTRAVENOUS

## 2022-08-07 MED ORDER — FENTANYL CITRATE (PF) 100 MCG/2ML IJ SOLN
INTRAMUSCULAR | Status: AC
  Filled 2022-08-07: qty 2

## 2022-08-07 MED ORDER — ONDANSETRON HCL 4 MG/2ML IJ SOLN
INTRAMUSCULAR | Status: AC
  Filled 2022-08-07: qty 2

## 2022-08-07 MED ORDER — PROPOFOL 10 MG/ML IV BOLUS
INTRAVENOUS | Status: DC | PRN
  Administered 2022-08-07: 200 mg via INTRAVENOUS
  Administered 2022-08-07: 100 mg via INTRAVENOUS
  Administered 2022-08-07 (×2): 50 mg via INTRAVENOUS

## 2022-08-07 MED ORDER — ACETAMINOPHEN 10 MG/ML IV SOLN
INTRAVENOUS | Status: DC | PRN
  Administered 2022-08-07: 1000 mg via INTRAVENOUS

## 2022-08-07 MED ORDER — DEXAMETHASONE SODIUM PHOSPHATE 10 MG/ML IJ SOLN
INTRAMUSCULAR | Status: DC | PRN
  Administered 2022-08-07: 5 mg via INTRAVENOUS

## 2022-08-07 MED ORDER — ACETAMINOPHEN 325 MG PO TABS: 325.0000 mg | ORAL_TABLET | Freq: Four times a day (QID) | ORAL | Status: DC | PRN

## 2022-08-07 MED ORDER — MIDAZOLAM HCL 2 MG/2ML IJ SOLN
INTRAMUSCULAR | Status: AC
  Filled 2022-08-07: qty 2

## 2022-08-07 MED ORDER — ONDANSETRON HCL 4 MG/2ML IJ SOLN: 4.0000 mg | Freq: Four times a day (QID) | INTRAMUSCULAR | Status: DC | PRN

## 2022-08-07 MED ORDER — BUPIVACAINE HCL (PF) 0.5 % IJ SOLN
INTRAMUSCULAR | Status: DC | PRN
  Administered 2022-08-07: 10 mL

## 2022-08-07 MED ORDER — PHENYLEPHRINE HCL (PRESSORS) 10 MG/ML IV SOLN
INTRAVENOUS | Status: DC | PRN
  Administered 2022-08-07: 120 ug via INTRAVENOUS
  Administered 2022-08-07: 160 ug via INTRAVENOUS
  Administered 2022-08-07 (×2): 80 ug via INTRAVENOUS
  Administered 2022-08-07: 120 ug via INTRAVENOUS

## 2022-08-07 MED ORDER — DEXAMETHASONE SODIUM PHOSPHATE 10 MG/ML IJ SOLN
INTRAMUSCULAR | Status: AC
  Filled 2022-08-07: qty 1

## 2022-08-07 MED ORDER — ACETAMINOPHEN 10 MG/ML IV SOLN
INTRAVENOUS | Status: AC
  Filled 2022-08-07: qty 100

## 2022-08-07 MED ORDER — CHLORHEXIDINE GLUCONATE 0.12 % MT SOLN
OROMUCOSAL | Status: AC
  Administered 2022-08-07: 15 mL via OROMUCOSAL
  Filled 2022-08-07: qty 15

## 2022-08-07 MED ORDER — FENTANYL CITRATE (PF) 100 MCG/2ML IJ SOLN
INTRAMUSCULAR | Status: DC | PRN
  Administered 2022-08-07 (×4): 25 ug via INTRAVENOUS

## 2022-08-07 MED ORDER — VANCOMYCIN HCL IN DEXTROSE 1-5 GM/200ML-% IV SOLN
INTRAVENOUS | Status: AC
  Filled 2022-08-07: qty 200

## 2022-08-07 MED ORDER — EPHEDRINE 5 MG/ML INJ
INTRAVENOUS | Status: AC
  Filled 2022-08-07: qty 5

## 2022-08-07 MED ORDER — MIDAZOLAM HCL 2 MG/2ML IJ SOLN
INTRAMUSCULAR | Status: DC | PRN
  Administered 2022-08-07: 2 mg via INTRAVENOUS

## 2022-08-07 MED ORDER — METOCLOPRAMIDE HCL 10 MG PO TABS: 5.0000 mg | ORAL_TABLET | Freq: Three times a day (TID) | ORAL | Status: DC | PRN

## 2022-08-07 MED ORDER — LIDOCAINE HCL (PF) 2 % IJ SOLN
INTRAMUSCULAR | Status: AC
  Filled 2022-08-07: qty 5

## 2022-08-07 MED ORDER — ORAL CARE MOUTH RINSE: 15.0000 mL | Freq: Once | OROMUCOSAL | Status: AC

## 2022-08-07 MED ORDER — EPHEDRINE SULFATE (PRESSORS) 50 MG/ML IJ SOLN
INTRAMUSCULAR | Status: DC | PRN
  Administered 2022-08-07: 5 mg via INTRAVENOUS
  Administered 2022-08-07: 2.5 mg via INTRAVENOUS
  Administered 2022-08-07: 10 mg via INTRAVENOUS
  Administered 2022-08-07 (×2): 5 mg via INTRAVENOUS

## 2022-08-07 MED ORDER — SODIUM CHLORIDE 0.9 % IV SOLN: INTRAVENOUS | Status: DC

## 2022-08-07 MED ORDER — LIDOCAINE HCL (CARDIAC) PF 100 MG/5ML IV SOSY
PREFILLED_SYRINGE | INTRAVENOUS | Status: DC | PRN
  Administered 2022-08-07: 80 mg via INTRAVENOUS

## 2022-08-07 MED ORDER — BUPIVACAINE HCL (PF) 0.5 % IJ SOLN
INTRAMUSCULAR | Status: AC
  Filled 2022-08-07: qty 30

## 2022-08-07 MED ORDER — CHLORHEXIDINE GLUCONATE 0.12 % MT SOLN: 15.0000 mL | Freq: Once | OROMUCOSAL | Status: AC

## 2022-08-07 MED ORDER — VANCOMYCIN HCL IN DEXTROSE 1-5 GM/200ML-% IV SOLN
1000.0000 mg | INTRAVENOUS | Status: AC
  Administered 2022-08-07: 1000 mg via INTRAVENOUS

## 2022-08-07 MED ORDER — PHENYLEPHRINE 80 MCG/ML (10ML) SYRINGE FOR IV PUSH (FOR BLOOD PRESSURE SUPPORT)
PREFILLED_SYRINGE | INTRAVENOUS | Status: AC
  Filled 2022-08-07: qty 10

## 2022-08-07 SURGICAL SUPPLY — 49 items
APL PRP STRL LF DISP 70% ISPRP (MISCELLANEOUS) ×1
APL SKNCLS STERI-STRIP NONHPOA (GAUZE/BANDAGES/DRESSINGS) ×1
BENZOIN TINCTURE PRP APPL 2/3 (GAUZE/BANDAGES/DRESSINGS) IMPLANT
BLADE DEBAKEY 8.0 (BLADE) ×1 IMPLANT
BLADE OSC/SAGITTAL 5.5X25 (BLADE) ×1 IMPLANT
BLADE SURG 15 STRL LF DISP TIS (BLADE) ×2 IMPLANT
BLADE SURG 15 STRL SS (BLADE) ×1
BNDG CMPR 5X3 KNIT ELC UNQ LF (GAUZE/BANDAGES/DRESSINGS) ×1
BNDG CMPR 5X4 CHSV STRCH STRL (GAUZE/BANDAGES/DRESSINGS) ×1
BNDG COHESIVE 4X5 TAN STRL LF (GAUZE/BANDAGES/DRESSINGS) ×1 IMPLANT
BNDG ELASTIC 3INX 5YD STR LF (GAUZE/BANDAGES/DRESSINGS) ×1 IMPLANT
BNDG ESMARCH 4 X 12 STRL LF (GAUZE/BANDAGES/DRESSINGS) ×1
BNDG ESMARCH 4X12 STRL LF (GAUZE/BANDAGES/DRESSINGS) ×1 IMPLANT
BNDG GZE 12X3 1 PLY HI ABS (GAUZE/BANDAGES/DRESSINGS) ×1
BNDG STRETCH GAUZE 3IN X12FT (GAUZE/BANDAGES/DRESSINGS) ×1 IMPLANT
BUR 4X55 1 (BURR) ×1 IMPLANT
CHLORAPREP W/TINT 26 (MISCELLANEOUS) ×1 IMPLANT
CUFF TOURN SGL QUICK 18X4 (TOURNIQUET CUFF) ×1 IMPLANT
DRAPE FLUOR MINI C-ARM 54X84 (DRAPES) ×1 IMPLANT
FORCEPS JEWEL BIP 4-3/4 STR (INSTRUMENTS) ×1 IMPLANT
GAUZE SPONGE 4X4 12PLY STRL (GAUZE/BANDAGES/DRESSINGS) IMPLANT
GAUZE XEROFORM 1X8 LF (GAUZE/BANDAGES/DRESSINGS) ×1 IMPLANT
GLOVE BIO SURGEON STRL SZ8 (GLOVE) ×2 IMPLANT
GLOVE INDICATOR 8.0 STRL GRN (GLOVE) ×1 IMPLANT
GOWN STRL REUS W/ TWL XL LVL3 (GOWN DISPOSABLE) ×1 IMPLANT
GOWN STRL REUS W/TWL XL LVL3 (GOWN DISPOSABLE) ×1
KIT TURNOVER KIT A (KITS) ×1 IMPLANT
MANIFOLD NEPTUNE II (INSTRUMENTS) ×1 IMPLANT
NS IRRIG 500ML POUR BTL (IV SOLUTION) ×1 IMPLANT
PACK EXTREMITY ARMC (MISCELLANEOUS) ×1 IMPLANT
PAD CAST 3X4 CTTN HI CHSV (CAST SUPPLIES) ×2 IMPLANT
PADDING CAST COTTON 3X4 STRL (CAST SUPPLIES) ×2
PASSER SUT SWANSON 36MM LOOP (INSTRUMENTS) IMPLANT
SPLINT CAST 1 STEP 3X12 (MISCELLANEOUS) ×1 IMPLANT
STOCKINETTE 48X4 2 PLY STRL (GAUZE/BANDAGES/DRESSINGS) ×1 IMPLANT
STOCKINETTE IMPERVIOUS 9X36 MD (GAUZE/BANDAGES/DRESSINGS) ×1 IMPLANT
STOCKINETTE STRL 4IN 9604848 (GAUZE/BANDAGES/DRESSINGS) ×1 IMPLANT
STRIP CLOSURE SKIN 1/2X4 (GAUZE/BANDAGES/DRESSINGS) ×1 IMPLANT
SUT ETHIBOND 0 MO6 C/R (SUTURE) ×1 IMPLANT
SUT PROLENE 4 0 PS 2 18 (SUTURE) ×1 IMPLANT
SUT VIC AB 2-0 CT2 27 (SUTURE) ×1 IMPLANT
SUT VIC AB 2-0 SH 27 (SUTURE) ×1
SUT VIC AB 2-0 SH 27XBRD (SUTURE) ×1 IMPLANT
SUT VIC AB 3-0 SH 27 (SUTURE) ×1
SUT VIC AB 3-0 SH 27X BRD (SUTURE) ×1 IMPLANT
SYSTEM IMPLANT TIGHTROPE MINI (Anchor) IMPLANT
TRAP FLUID SMOKE EVACUATOR (MISCELLANEOUS) ×1 IMPLANT
WATER STERILE IRR 500ML POUR (IV SOLUTION) ×1 IMPLANT
WIRE Z .062 C-WIRE SPADE TIP (WIRE) IMPLANT

## 2022-08-07 NOTE — Anesthesia Procedure Notes (Addendum)
Procedure Name: LMA Insertion Date/Time: 08/07/2022 11:43 AM  Performed by: Anael Rosch, [place], CRNAPre-anesthesia Checklist: Patient identified, Patient being monitored, Timeout performed, Emergency Drugs available and Suction available Patient Re-evaluated:Patient Re-evaluated prior to induction Oxygen Delivery Method: Circle system utilized Preoxygenation: Pre-oxygenation with 100% oxygen Induction Type: IV induction Ventilation: Mask ventilation without difficulty LMA: LMA inserted LMA Size: 4.0 Tube type: Oral Number of attempts: 1 Placement Confirmation: positive ETCO2 and breath sounds checked- equal and bilateral Tube secured with: Tape Dental Injury: Teeth and Oropharynx as per pre-operative assessment

## 2022-08-07 NOTE — H&P (Signed)
History of Present Illness: Caitlyn Roth is a 57 y.o. who presents today for a history and physical. She is to undergo a left thumb CMC arthroplasty on 08/07/2022. Since her last visit at the clinic there have been no improvement in her condition. The patient expresses her desire to proceed with surgery.  The patient was last evaluated for her left hand in November of last year. 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 The patient is unable to tolerate steroid injections, because of this the patient was placed in a Velcro wrist splint and was given a prescription of Lodine to take routinely. The patient continues to have increased discomfort when attempting to grab and lift objects with her left hand. She reports a aching and throbbing pain in her left thumb. She does report some numbness and tingling but does have a history of cervical radiculopathy and recently underwent a cervical ablation procedure which did help with the numbness and tingling in the left arm and pain in her neck. She is quite frustrated by her continued discomfort when trying to grip and lift objects at this time. Pain score today is a 4 out of 10. She denies any personal history of asthma or COPD. The patient does have a history of a cardiac catheterization performed at [facility] in 2016. She is not on any blood thinning medication. She is a diabetic, recent A1c was 7.8. She denies any history of blood clots.   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  Cataracts, bilateral  Eczema, unspecified  Fatty liver  on ultrasound imaging 07/2018  Fuchs' corneal dystrophy  GERD (gastroesophageal reflux disease)  High cholesterol  Hypertension  Hyperthyroidism  Migraines  Mixed  hyperlipidemia 02/07/2014  Rotator cuff tendinitis, right 08/02/2016  Stroke (CMS-HCC) 2009  loss of left eye peripheral vision  Tobacco dependence  Type 2 diabetes mellitus (CMS-HCC)  Diagnosis 08/2017   Past Surgical History: TUBAL LIGATION 1986  Sinus reconstruction and stent placement June 2015  CARDIAC CATHETERIZATION 2016  after MI 1 stent done @ [place] Right 05/16/2015  DSEK OD  EXTRACTION CATARACT EXTRACAPSULAR W/INSERTION INTRAOCULAR PROSTHESIS Right 05/16/2015  CE IOL OD  REPAIR ROTATOR CUFF TEAR CHRONIC OPEN Right 2017  LIMITED ARTHROSCOPIC DEBRIDEMENT,ARTHROSCOPIC SUBACROMIAL DECOMPRESSION,MINI-OPEN ROTATOR CUFF REPAIR,AND MINI-OPEN BICEPS TENODESIS,RIGHT SHOULDER Right 10/01/2016  DR.Ciarra Braddy  EXTRACTION CATARACT EXTRACAPSULAR W/INSERTION INTRAOCULAR PROSTHESIS Left 05/26/2018  Procedure: Cataract Extraction with intraocular lens implantation, left eye; Surgeon: Moody Bruins, MD; Location: [place]; Service: Ophthalmology; Laterality: Left;  CESAREAN SECTION X#  CHOLECYSTECTOMY  KNEE ARTHROSCOPY numerous  knee surgeries x3  LAPAROSCOPIC TUBAL LIGATION  TONSILLECTOMY   Past Family History: Diabetes Sister  Gout Sister  Obesity Sister  Thyroid disease Sister  Macular degeneration Mother  Atrial fibrillation (Abnormal heart rhythm sometimes requiring treatment with blood thinners) Mother  High blood pressure (Hypertension) Mother  Stroke Mother 76  Thyroid disease Mother  Coronary Artery Disease (Blocked arteries around heart) Father  Microcephaly Father  Heart failure Father  Alcohol abuse Father  Hyperlipidemia (Elevated cholesterol) Father  Breast cancer Maternal Aunt  High blood pressure (Hypertension) Maternal Grandmother  Stroke Maternal Grandmother  Blindness Neg Hx  Glaucoma Neg Hx  Colon cancer Neg Hx  Prostate cancer Neg Hx   Medications: albuterol 90 mcg/actuation  inhaler Inhale 2 inhalations into the lungs every 6 (six)  hours as needed 18 g 0  aspirin 81 MG EC tablet Take 1 tablet (81 mg total) by mouth once daily  azelastine (ASTELIN) 137 mcg nasal spray Place 1 spray into both nostrils 2 (two) times daily 10 mL 1  brimonidine (ALPHAGAN) 0.2 % ophthalmic solution Place 1 drop into the right eye 3 (three) times daily 5 mL 11  busPIRone (BUSPAR) 10 MG tablet Take 1 tablet (10 mg total) by mouth 3 (three) times daily 90 tablet 1  cyanocobalamin (VITAMIN B12) 1,000 mcg/mL injection INJECT 1ML IN THE MUSCLE ONCE A MONTH 3 mL 3  desloratadine (CLARINEX) 5 mg tablet TAKE 1 TABLET(5 MG) BY MOUTH EVERY DAY 90 tablet 1  dextroamphetamine-amphetamine (ADDERALL XR) 30 MG XR capsule Take 1 capsule (30 mg total) by mouth every morning for 90 days 90 capsule 0  diazePAM (VALIUM) 5 MG tablet 1 30 minutes before procedure 1 tablet 0  empagliflozin (JARDIANCE) 25 mg tablet TAKE 1 TABLET(25 MG) BY MOUTH EVERY DAY 90 tablet 1  etodolac (LODINE) 500 MG tablet Take 1 tablet (500 mg total) by mouth 2 (two) times daily 60 tablet 1  losartan (COZAAR) 25 MG tablet TAKE 1 TABLET(25 MG) BY MOUTH EVERY DAY 30 tablet 5  loteprednol (LOTEMAX) 0.5 % ophthalmic suspension Place 1 drop into the right eye once daily 5 mL 6  methIMAzole (TAPAZOLE) 5 MG tablet TAKE 1/2 TABLET(2.5 MG) BY MOUTH EVERY DAY 45 tablet 2  metoprolol succinate (TOPROL-XL) 50 MG XL tablet TAKE 1 TABLET(50 MG) BY MOUTH EVERY DAY 90 tablet 2  montelukast (SINGULAIR) 10 mg tablet TAKE 1 TABLET(10 MG) BY MOUTH EVERY DAY 90 tablet 1  ondansetron (ZOFRAN-ODT) 4 MG disintegrating tablet Take 1 tablet (4 mg total) by mouth every 8 (eight) hours as needed for Nausea for up to 12 doses 12 tablet 0  pantoprazole (PROTONIX) 40 MG DR tablet TAKE 1 TABLET(40 MG) BY MOUTH EVERY DAY 90 tablet 1  promethazine (PHENERGAN) 25 MG tablet Take 1 tablet (25 mg total) by mouth every 8 (eight) hours as needed for Nausea or Vomiting 21 tablet 0  timoloL maleate (TIMOPTIC) 0.5 % ophthalmic solution  Place 1 drop into the right eye 2 (two) times daily 20 mL 3  venlafaxine (EFFEXOR-XR) 75 MG XR capsule TAKE 3 CAPSULES BY MOUTH DAILY 270 capsule 1  gabapentin (NEURONTIN) 300 MG capsule Take 2 capsules (600 mg total) by mouth at bedtime for 90 days 60 capsule 1  rosuvastatin (CRESTOR) 10 MG tablet Take 1 tablet (10 mg total) by mouth once daily 90 tablet 1   Allergies: Cefdinir (Nausea, Vomiting, Swelling, Swelling of throat)  Codeine Nausea  Milk Other (Difficulty breathing, throat swells)  Prednisone Other (LOST 40% OF VISION IN ONE EYE)  Zovirax [Acyclovir Sodium] (Nausea And Vomiting)  Review of Systems: A comprehensive 14 point ROS was performed, reviewed, and the pertinent orthopaedic findings are documented in the HPI.  Physical Exam: BP 120/80 (BP Location: Left upper arm, Patient Position: Sitting, BP Cuff Size: Adult)  Ht 175.3 cm (5\' 9" )  Wt 78.7 kg (173 lb 9.6 oz)  LMP (LMP Unknown)  BMI 25.64 kg/m   General: Well-developed well-nourished female seen in no acute distress.   HEENT: Atraumatic,normocephalic. Pupils are equal and reactive to light. Oropharynx is clear with moist mucosa  Lungs: Clear to auscultation bilaterally   Cardiovascular: Regular rate and rhythm. Normal S1, S2. No murmurs. No appreciable gallops or rubs.  Peripheral pulses are palpable.  Abdomen: Soft, non-tender, nondistended. Bowel sounds present  Left hand exam: Examination of the left hand revealed no bony abnormality, no ecchymosis, and no edema. The patient had full range of motion of the wrist and hand, including the digits. The patient is tender to palpation at the base of the left thumb over the [facility] joint. Positive grind test. Negative Finkelstein's test. Negative Tinel's test, negative Phalen's test to the left hand. No catching or locking of the digits is identified. The patient had no snuffbox tenderness. The patient had full composite fist. There was no angulation or rotation of the  digits.   Neurological: The patient is alert and oriented Sensation to light touch appears to be intact and within normal limits Gross motor strength appeared to be equal to 5/5  Vascular : Peripheral pulses felt to be palpable. Capillary refill appears to be intact and within normal limits  X-ray: Taken on 02/07/2022 in [place] clinic demonstrated moderate osteoarthritic changes involving the [facility] joint. There is moderate loss of the joint space in addition to his underlying subluxation of this area as well.  Impression: 1. Degenerative arthrosis CMC joint left thumb  Plan:   1. Treatment options were discussed today with the patient. 2. The patient is continuing to have pain and discomfort at the base of her left thumb, worse with gripping and lifting objects at this time. 3. After discussion of both conservative and aggressive treatment options, the patient would like to proceed with a [place] arthroplasty to be performed by Dr. Roland Rack. 4. This document will serve as a surgical history and physical for the patient. 5. The patient will follow-up per standard postop protocol. 6. She can contact the question if she have any questions, new symptoms develop or symptoms worsen.  The procedure was discussed with the patient, as were the potential risks (including bleeding, infection, nerve and/or blood vessel injury, persistent or recurrent pain, failure of the repair, weakness, instability, need for further surgery, blood clots, strokes, heart attacks and/or arhythmias, pneumonia, etc.) and benefits. The patient states her understanding and wishes to proceed.    H&P reviewed and patient re-examined. No changes.

## 2022-08-07 NOTE — Transfer of Care (Signed)
Immediate Anesthesia Transfer of Care Note  Patient: Caitlyn Roth  Procedure(s) Performed: LEFT THUMB CMC ARTHROPLASTY (Left: Thumb)  Patient Location: PACU  Anesthesia Type:General  Level of Consciousness: drowsy  Airway & Oxygen Therapy: Patient Spontanous Breathing and Patient connected to nasal cannula oxygen  Post-op Assessment: Report given to RN and Post -op Vital signs reviewed and stable  Post vital signs: Reviewed and stable  Last Vitals:  Vitals Value Taken Time  BP 104/46 08/07/22 1319  Temp 97.5   Pulse 72 08/07/22 1322  Resp 13 08/07/22 1322  SpO2 100 % 08/07/22 1322  Vitals shown include unvalidated device data.  Last Pain:  Vitals:   08/07/22 0931  TempSrc: Tympanic  PainSc: 0-No pain         Complications: No notable events documented.

## 2022-08-07 NOTE — Discharge Instructions (Addendum)
Orthopedic discharge instructions: Keep splint dry and intact. Keep hand elevated above heart level. Apply ice to affected area frequently. Take ibuprofen 600-800 mg TID with meals for 5-7 days, then as necessary. Take ES Tylenol as needed.  Return for follow-up in 10-14 days or as scheduled.   AMBULATORY SURGERY  DISCHARGE INSTRUCTIONS   The drugs that you were given will stay in your system until tomorrow so for the next 24 hours you should not:  Drive an automobile Make any legal decisions Drink any alcoholic beverage   You may resume regular meals tomorrow.  Today it is better to start with liquids and gradually work up to solid foods.  You may eat anything you prefer, but it is better to start with liquids, then soup and crackers, and gradually work up to solid foods.   Please notify your doctor immediately if you have any unusual bleeding, trouble breathing, redness and pain at the surgery site, drainage, fever, or pain not relieved by medication.     Additional Instructions:

## 2022-08-07 NOTE — Anesthesia Preprocedure Evaluation (Signed)
Anesthesia Evaluation  Patient identified by MRN, date of birth, ID band Patient awake    Reviewed: Allergy & Precautions, H&P , NPO status , Patient's Chart, lab work & pertinent test results, reviewed documented beta blocker date and time   History of Anesthesia Complications Negative for: history of anesthetic complications  Airway Mallampati: II  TM Distance: >3 FB Neck ROM: full    Dental  (+) Implants, Missing, Teeth Intact, Dental Advidsory Given   Pulmonary neg shortness of breath, asthma , neg sleep apnea, neg COPD, neg recent URI, Current Smoker and Patient abstained from smoking.          Cardiovascular Exercise Tolerance: Good hypertension, (-) angina + CAD, + Past MI and + Cardiac Stents  (-) CABG negative cardio ROS + dysrhythmias (-) Valvular Problems/Murmurs     Neuro/Psych neg Seizures CVA  negative psych ROS   GI/Hepatic Neg liver ROS,GERD  ,,  Endo/Other  diabetes Hyperthyroidism   Renal/GU negative Renal ROS  negative genitourinary   Musculoskeletal   Abdominal   Peds  Hematology negative hematology ROS (+)   Anesthesia Other Findings Past Medical History: No date: Anxiety No date: Asthma No date: Family history of adverse reaction to anesthes*     Comment: dad had difficulty waking up from anesthesia No date: GERD (gastroesophageal reflux disease) No date: Graves disease     Comment: Graves disease No date: Hyperlipemia No date: Hypertension 01/2014: Myocardial infarction ([place]) No date: Pre-diabetes 2009: Stroke [facility])     Comment: peripheral vision in left eye has been               affected,   Reproductive/Obstetrics negative OB ROS                             Anesthesia Physical Anesthesia Plan  ASA: 3  Anesthesia Plan: General   Post-op Pain Management: GA combined w/ Regional for post-op pain   Induction: Intravenous  PONV Risk Score and Plan: 2  and Ondansetron, Dexamethasone, Treatment may vary due to age or medical condition and Midazolam  Airway Management Planned: LMA  Additional Equipment:   Intra-op Plan:   Post-operative Plan: Extubation in OR  Informed Consent: I have reviewed the patients History and Physical, chart, labs and discussed the procedure including the risks, benefits and alternatives for the proposed anesthesia with the patient or authorized representative who has indicated his/her understanding and acceptance.     Dental Advisory Given  Plan Discussed with: Anesthesiologist, CRNA and Surgeon  Anesthesia Plan Comments:         Anesthesia Quick Evaluation

## 2022-08-07 NOTE — Op Note (Signed)
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  Post-Op Diagnosis:   Same.  Procedure:   Suspension arthroplasty left thumb CMC joint.   Surgeon:   Pascal Lux, MD  Assistant:   None  Anesthesia:   General LMA  Findings:   As above.  Complications:   None  EBL:   0 cc  Fluids:   500 cc crystalloid  TT:   65 minutes at 250 mmHg  Drains:   None  Closure:   3-0 Vicryl subcuticular sutures  Implants:   Arthrex 1.1 mm CMC Mini Tightrope.  Brief Clinical Note:   The patient is a 57 year old female with a history of progressively worsening basilar left thumb pain. The patient's symptoms have progressed despite medications, activity modification, injections, splinting, etc. The patient's history and examination are consistent with advanced degenerative joint disease of the left thumb CMC joint which was confirmed by plain radiographs. The patient presents at this time for a suspension arthroplasty of the left thumb CMC joint.  Procedure:    The patient was brought into the operating room and lain in the supine position. After adequate general laryngeal mask anesthesia was obtained, the patient's left hand and upper extremity were prepped with ChloraPrep solution before being draped sterilely. Preoperative antibiotics were administered. A timeout was performed to verify the appropriate surgical site before the limb was exsanguinated with an Esmarch and the tourniquet inflated to 250 mmHg.   An approximately 4-5 cm longitudinal incision was made centered over the radial aspect of the thumb CMC joint. The incision was carried down through the subcutaneous cutaneous tissues with care taken to identify and protect the local neurovascular structures. Several small veins were cauterized with bipolar electrocautery. A longitudinal incision was made through the capsular tissues over the dorsal aspect of the thumb CMC  joint. Subperiosteal dissection was carried out to expose the trapezium dorsally and volarly. Once the [facility] and scaphotrapezial joints were identified, a sagittal cut was made in the trapezium using the micro-oscillating saw. This cut extended approximately two-thirds down through the bone. A Hoke osteotome was used to complete transection of the bone. The bone was then removed piecemeal using rongeurs and further dissection. The flexor carpi radialis tendon was identified at the base of the defect. The base of the thumb metacarpal was prepared by exposing its radial base.  The Arthrex 1.1 mm guidewire was drilled up through the proximal end of the thumb metacarpal beginning at the dorsoradial surface and advancing into the volar radial aspect of the proximal index metacarpal shaft. The adequacy of pin position was verified using FluoroScan imaging in AP, lateral, and oblique projections and found to be excellent.  An approximate 1.5 cm incision was made over the dorsal aspect of the proximal end of the index metacarpal. The incision was carried down through the subcutaneous tissues with care taken to avoid damaging the extensor tendon. The interosseous muscle was dissected away from the dorso-ulnar aspect of the proximal index metacarpal shaft before the guidewire was advanced through the final cortex into this wound. The suture was passed through the nitinol wire loop and pulled across the thumb and index metacarpals, seating the mini-Endobutton against the proximal end of the thumb metacarpal. The suture was cut and each and passed through the second mini-Endobutton. The Endobutton was advanced to rest against the dorso-ulnar aspect of the proximal index metacarpal shaft before it was  tied securely with appropriate tension to stabilize the base of the thumb metacarpal against the base of the index metacarpal. Again, the adequacy of Endobutton position and metacarpal reduction was verified fluoroscopically in  AP, lateral, and oblique projections and found to be excellent. The thumb was then placed through a range of motion. It was able to be abducted and adducted fully and remained distracted to gentle axial loading.  The wounds were copiously irrigated with sterile saline solution using bulb irrigation. The thumb CMC capsular tissues were reapproximated using 2-0 Vicryl interrupted sutures before the skin was closed using 3-0 Vicryl subcuticular interrupted sutures. Benzoin and Steri-Strips were applied to the skin. The skin of the more dorsal incision was closed using 3-0 Vicryl subcuticular sutures. A total of 10 cc of 0.5% plain Sensorcaine was injected in and around both incisions to help with postoperative analgesia. A sterile bulky dressing was applied to the wounds before the patient was placed into a fiberglass thumb spica splint maintaining the thumb in the position of function. The patient was then awakened, extubated, and returned to the recovery room in satisfactory condition after tolerating the procedure well.

## 2022-08-08 ENCOUNTER — Encounter: Payer: Self-pay | Admitting: Surgery

## 2022-08-08 DIAGNOSIS — M1812 Unilateral primary osteoarthritis of first carpometacarpal joint, left hand: Secondary | ICD-10-CM | POA: Insufficient documentation

## 2022-08-08 NOTE — Anesthesia Postprocedure Evaluation (Signed)
Anesthesia Post Note  Patient: Caitlyn Roth  Procedure(s) Performed: LEFT THUMB [place] ARTHROPLASTY (Left: Thumb)  Patient location during evaluation: PACU Anesthesia Type: General Level of consciousness: awake and alert Pain management: pain level controlled Vital Signs Assessment: post-procedure vital signs reviewed and stable Respiratory status: spontaneous breathing, nonlabored ventilation, respiratory function stable and patient connected to nasal cannula oxygen Cardiovascular status: blood pressure returned to baseline and stable Postop Assessment: no apparent nausea or vomiting Anesthetic complications: no   No notable events documented.   Last Vitals:  Vitals:   08/07/22 1415 08/07/22 1432  BP: 118/72 130/74  Pulse: 85 84  Resp: 15 18  Temp: 36.6 C (!) 36.3 C  SpO2: 95%     Last Pain:  Vitals:   08/07/22 1432  TempSrc: Temporal  PainSc: 0-No pain                 Martha Clan

## 2022-10-04 ENCOUNTER — Encounter: Payer: Self-pay | Admitting: Occupational Therapy

## 2022-10-04 ENCOUNTER — Ambulatory Visit: Payer: BC Managed Care – PPO | Attending: Surgery | Admitting: Occupational Therapy

## 2022-10-04 DIAGNOSIS — M25642 Stiffness of left hand, not elsewhere classified: Secondary | ICD-10-CM | POA: Diagnosis present

## 2022-10-04 DIAGNOSIS — M6281 Muscle weakness (generalized): Secondary | ICD-10-CM | POA: Diagnosis present

## 2022-10-04 DIAGNOSIS — L905 Scar conditions and fibrosis of skin: Secondary | ICD-10-CM | POA: Insufficient documentation

## 2022-10-04 DIAGNOSIS — M79642 Pain in left hand: Secondary | ICD-10-CM | POA: Diagnosis present

## 2022-10-04 NOTE — Therapy (Signed)
Body Structure / Function / Physical Skills ADL;Edema;Decreased knowledge of precautions;Flexibility;ROM;Scar mobility;Pain;Strength;IADL    Rehab Potential Good    Clinical Decision Making Limited treatment options, no task modification necessary    Comorbidities Affecting Occupational Performance: None    Modification or Assistance to Complete Evaluation  No modification of tasks or assist necessary to complete eval    OT Frequency 2x / week   decrease to 1 x wk   OT Duration 6 weeks    OT Treatment/Interventions Self-care/ADL training;Contrast Bath;Fluidtherapy;Paraffin;Therapeutic exercise;DME and/or AE instruction;Manual Therapy;Scar  mobilization;Splinting;Patient/family education;Passive range of motion    Consulted and Agree with Plan of Care Patient             Patient will benefit from skilled therapeutic intervention in order to improve the following deficits and impairments:   Body Structure / Function / Physical Skills: ADL, Edema, Decreased knowledge of precautions, Flexibility, ROM, Scar mobility, Pain, Strength, IADL       Visit Diagnosis: Pain in left hand  Stiffness of left hand, not elsewhere classified  Scar tissue  Muscle weakness (generalized)    Problem List Patient Active Problem List   Diagnosis Date Noted   Mixed hyperlipidemia 01/29/2017   Vitamin D deficiency 01/29/2017   Biceps tendinitis of right upper extremity 08/02/2016   Complete tear of right rotator cuff 08/02/2016   Arteriosclerotic coronary artery disease 02/07/2015   Cerebral infarction (HCC) 11/03/2014   Eczema 11/03/2014   Hypertension 11/03/2014   Hyperthyroidism 11/03/2014   Migraine 11/03/2014   Hypercholesterolemia 11/11/2007   Allergic rhinitis 08/12/2007   Barrett's esophagus without dysplasia 08/12/2007   Gastro-esophageal reflux disease without esophagitis 08/12/2007    Caitlyn Roth, OTR/L,CLT 10/04/2022, 12:50 PM  [facility] [address], [place], [address] Phone: [phone]   Fax:  [phone]  Name: Caitlyn Roth MRN: [id number] Date of Birth: [birth year]  [facility] [address] [place], [place], [address] Phone: [phone]   Fax:  [phone]  Occupational Therapy Evaluation  Patient Details  Name: Caitlyn Roth MRN: [id number] Date of Birth: [birth year] Referring Provider (OT): Dr Joice Lofts   Encounter Date: 10/04/2022   OT End of Session - 10/04/22 1240     Visit Number 1    Number of Visits 8    Date for OT Re-Evaluation 11/15/22    OT Start Time 1120    OT Stop Time 1211    OT Time Calculation (min) 51 min    Activity Tolerance Patient tolerated treatment well    Behavior During Therapy [facility] for tasks assessed/performed             Past Medical History:  Diagnosis Date   A-fib [facility])    a.) CHA2DS2VASc = 6 (sex, HTN, CVA/TIA x2,  vascular disease history, T2DM);  b.) rate/rhythm maintained on oral metoprolol succinate; no chronic anticoagulation   Acute ST elevation myocardial infarction (STEMI) of inferior wall (HCC) 01/28/2015   a.) LHC/PCI 01/28/2015 --> 70% pRCA (3.5 x 20 mm Promus Premier)   ADHD (attention deficit hyperactivity disorder)    a.) on amphetamine-dextroamphetamine   Anxiety    Arthritis    Asthma    CAD (coronary artery disease) 01/28/2015   a.) LHC/PCI 01/28/2015 --> 70% pRCA (3.5 x 20 mm Promus Premier DES)   Diabetic polyneuropathy (HCC)    Diastolic dysfunction 01/28/2015   a.) TTE 01/28/2015: EF 50, inf/post HK, mild BAE, triv MR/TR, G1DD   Eczema    Family history of adverse reaction to anesthesia    a.) delayed emergence in 1st degree relative (father)   [name] corneal dystrophy    GERD (gastroesophageal reflux disease)    Graves disease    a.) on methimazole   Hepatic steatosis    Hyperlipemia    Hypertension    Migraines    Right rotator cuff tendinitis    Sleep apnea    Stroke (HCC) 2009   peripheral vision in left eye has been affected,   T2DM (type 2 diabetes mellitus) (HCC)    Vitamin B 12 deficiency     Past  Surgical History:  Procedure Laterality Date   CARPOMETACARPAL (CMC) FUSION OF THUMB Left 08/07/2022   Procedure: LEFT THUMB CMC ARTHROPLASTY;  Surgeon: Christena Flake, MD;  Location: [facility];  Service: Orthopedics;  Laterality: Left;   CESAREAN SECTION     CHOLECYSTECTOMY     CORONARY ANGIOPLASTY WITH STENT PLACEMENT Left 01/28/2015   Procedure: CORONARY ANGIOPLASTY WITH STENT PLACEMENT; Location: [facility]   KNEE ARTHROSCOPY Left    KNEE SURGERY Left 1990   McKay's procedure   SHOULDER ARTHROSCOPY WITH OPEN ROTATOR CUFF REPAIR Right 10/01/2016   Procedure: SHOULDER ARTHROSCOPY WITH OPEN ROTATOR CUFF REPAIR;  Surgeon: Christena Flake, MD;  Location: [facility];  Service: Orthopedics;  Laterality: Right;  Shoulder Block    SINUS SURGERY WITH INSTATRAK     TONSILLECTOMY  1978   TUBAL LIGATION      There were no vitals filed for this visit.   Subjective Assessment - 10/04/22 1232     Subjective  Feels okay like I sit her but using it at home pain can increase to 5/10 pain -with any gripping or using my thumb - I can use my fingers- wrist bothers me at times to    Pertinent History Ortho visit 09/20/22: Assessment: Encounter Diagnoses Name  Body Structure / Function / Physical Skills ADL;Edema;Decreased knowledge of precautions;Flexibility;ROM;Scar mobility;Pain;Strength;IADL    Rehab Potential Good    Clinical Decision Making Limited treatment options, no task modification necessary    Comorbidities Affecting Occupational Performance: None    Modification or Assistance to Complete Evaluation  No modification of tasks or assist necessary to complete eval    OT Frequency 2x / week   decrease to 1 x wk   OT Duration 6 weeks    OT Treatment/Interventions Self-care/ADL training;Contrast Bath;Fluidtherapy;Paraffin;Therapeutic exercise;DME and/or AE instruction;Manual Therapy;Scar  mobilization;Splinting;Patient/family education;Passive range of motion    Consulted and Agree with Plan of Care Patient             Patient will benefit from skilled therapeutic intervention in order to improve the following deficits and impairments:   Body Structure / Function / Physical Skills: ADL, Edema, Decreased knowledge of precautions, Flexibility, ROM, Scar mobility, Pain, Strength, IADL       Visit Diagnosis: Pain in left hand  Stiffness of left hand, not elsewhere classified  Scar tissue  Muscle weakness (generalized)    Problem List Patient Active Problem List   Diagnosis Date Noted   Mixed hyperlipidemia 01/29/2017   Vitamin D deficiency 01/29/2017   Biceps tendinitis of right upper extremity 08/02/2016   Complete tear of right rotator cuff 08/02/2016   Arteriosclerotic coronary artery disease 02/07/2015   Cerebral infarction (HCC) 11/03/2014   Eczema 11/03/2014   Hypertension 11/03/2014   Hyperthyroidism 11/03/2014   Migraine 11/03/2014   Hypercholesterolemia 11/11/2007   Allergic rhinitis 08/12/2007   Barrett's esophagus without dysplasia 08/12/2007   Gastro-esophageal reflux disease without esophagitis 08/12/2007    Caitlyn Roth, OTR/L,CLT 10/04/2022, 12:50 PM  [facility] [address], [place], [address] Phone: [phone]   Fax:  [phone]  Name: Caitlyn Roth MRN: [id number] Date of Birth: [birth year]  Body Structure / Function / Physical Skills ADL;Edema;Decreased knowledge of precautions;Flexibility;ROM;Scar mobility;Pain;Strength;IADL    Rehab Potential Good    Clinical Decision Making Limited treatment options, no task modification necessary    Comorbidities Affecting Occupational Performance: None    Modification or Assistance to Complete Evaluation  No modification of tasks or assist necessary to complete eval    OT Frequency 2x / week   decrease to 1 x wk   OT Duration 6 weeks    OT Treatment/Interventions Self-care/ADL training;Contrast Bath;Fluidtherapy;Paraffin;Therapeutic exercise;DME and/or AE instruction;Manual Therapy;Scar  mobilization;Splinting;Patient/family education;Passive range of motion    Consulted and Agree with Plan of Care Patient             Patient will benefit from skilled therapeutic intervention in order to improve the following deficits and impairments:   Body Structure / Function / Physical Skills: ADL, Edema, Decreased knowledge of precautions, Flexibility, ROM, Scar mobility, Pain, Strength, IADL       Visit Diagnosis: Pain in left hand  Stiffness of left hand, not elsewhere classified  Scar tissue  Muscle weakness (generalized)    Problem List Patient Active Problem List   Diagnosis Date Noted   Mixed hyperlipidemia 01/29/2017   Vitamin D deficiency 01/29/2017   Biceps tendinitis of right upper extremity 08/02/2016   Complete tear of right rotator cuff 08/02/2016   Arteriosclerotic coronary artery disease 02/07/2015   Cerebral infarction (HCC) 11/03/2014   Eczema 11/03/2014   Hypertension 11/03/2014   Hyperthyroidism 11/03/2014   Migraine 11/03/2014   Hypercholesterolemia 11/11/2007   Allergic rhinitis 08/12/2007   Barrett's esophagus without dysplasia 08/12/2007   Gastro-esophageal reflux disease without esophagitis 08/12/2007    Caitlyn Roth, OTR/L,CLT 10/04/2022, 12:50 PM  [facility] [address], [place], [address] Phone: [phone]   Fax:  [phone]  Name: Caitlyn Roth MRN: [id number] Date of Birth: [birth year]

## 2022-10-07 ENCOUNTER — Ambulatory Visit: Payer: BC Managed Care – PPO | Admitting: Occupational Therapy

## 2022-10-07 DIAGNOSIS — M25642 Stiffness of left hand, not elsewhere classified: Secondary | ICD-10-CM

## 2022-10-07 DIAGNOSIS — L905 Scar conditions and fibrosis of skin: Secondary | ICD-10-CM

## 2022-10-07 DIAGNOSIS — M79642 Pain in left hand: Secondary | ICD-10-CM | POA: Diagnosis not present

## 2022-10-07 DIAGNOSIS — M6281 Muscle weakness (generalized): Secondary | ICD-10-CM

## 2022-10-07 NOTE — Therapy (Signed)
Consulted and Agree with Plan of Care Patient             Patient will benefit from skilled therapeutic intervention in order to improve the following deficits and impairments:   Body Structure / Function / Physical Skills: ADL, Edema, Decreased knowledge of precautions, Flexibility, ROM, Scar mobility, Pain, Strength, IADL        Visit Diagnosis: Pain in left hand  Stiffness of left hand, not elsewhere classified  Scar tissue  Muscle weakness (generalized)    Problem List Patient Active Problem List   Diagnosis Date Noted   Mixed hyperlipidemia 01/29/2017   Vitamin D deficiency 01/29/2017   Biceps tendinitis of right upper extremity 08/02/2016   Complete tear of right rotator cuff 08/02/2016   Arteriosclerotic coronary artery disease 02/07/2015   Cerebral infarction (HCC) 11/03/2014   Eczema 11/03/2014   Hypertension 11/03/2014   Hyperthyroidism 11/03/2014   Migraine 11/03/2014   Hypercholesterolemia 11/11/2007   Allergic rhinitis 08/12/2007   Barrett's esophagus without dysplasia 08/12/2007   Gastro-esophageal reflux disease without esophagitis 08/12/2007    [name], OTR/L,CLT 10/07/2022, 8:54 AM  [facility] [address] [place], [address] Phone: [phone]   Fax:  [phone]  Name: KARLIYAH KRAUSSE MRN: [id number] Date of Birth: 19-Feb-1966  [facility] [address] [place], [place], [address] Phone: [phone]   Fax:  [phone]  Occupational Therapy Treatment  Patient Details  Name: Caitlyn Roth MRN: [id number] Date of Birth: August 04, 1965 Referring Provider (OT): Dr Joice Lofts   Encounter Date: 10/07/2022   OT End of Session - 10/07/22 0751     Visit Number 2    Number of Visits 8    Date for OT Re-Evaluation 11/15/22    OT Start Time 0715    OT Stop Time 0756    OT Time Calculation (min) 41 min    Activity Tolerance Patient tolerated treatment well    Behavior During Therapy [facility] for tasks assessed/performed             Past Medical History:  Diagnosis Date   A-fib [facility])    a.) CHA2DS2VASc = 6 (sex, HTN, CVA/TIA x2,  vascular disease history, T2DM);  b.) rate/rhythm maintained on oral metoprolol succinate; no chronic anticoagulation   Acute ST elevation myocardial infarction (STEMI) of inferior wall (HCC) 01/28/2015   a.) LHC/PCI 01/28/2015 --> 70% pRCA (3.5 x 20 mm Promus Premier)   ADHD (attention deficit hyperactivity disorder)    a.) on amphetamine-dextroamphetamine   Anxiety    Arthritis    Asthma    CAD (coronary artery disease) 01/28/2015   a.) LHC/PCI 01/28/2015 --> 70% pRCA (3.5 x 20 mm Promus Premier DES)   Diabetic polyneuropathy (HCC)    Diastolic dysfunction 01/28/2015   a.) TTE 01/28/2015: EF 50, inf/post HK, mild BAE, triv MR/TR, G1DD   Eczema    Family history of adverse reaction to anesthesia    a.) delayed emergence in 1st degree relative (father)   [name] corneal dystrophy    GERD (gastroesophageal reflux disease)    Graves disease    a.) on methimazole   Hepatic steatosis    Hyperlipemia    Hypertension    Migraines    Right rotator cuff tendinitis    Sleep apnea    Stroke (HCC) 2009   peripheral vision in left eye has been affected,   T2DM (type 2 diabetes mellitus) (HCC)    Vitamin B 12 deficiency     Past Surgical  History:  Procedure Laterality Date   CARPOMETACARPAL (CMC) FUSION OF THUMB Left 08/07/2022   Procedure: LEFT THUMB CMC ARTHROPLASTY;  Surgeon: Christena Flake, MD;  Location: [facility];  Service: Orthopedics;  Laterality: Left;   CESAREAN SECTION     CHOLECYSTECTOMY     CORONARY ANGIOPLASTY WITH STENT PLACEMENT Left 01/28/2015   Procedure: CORONARY ANGIOPLASTY WITH STENT PLACEMENT; Location: [facility]   KNEE ARTHROSCOPY Left    KNEE SURGERY Left 1990   McKay's procedure   SHOULDER ARTHROSCOPY WITH OPEN ROTATOR CUFF REPAIR Right 10/01/2016   Procedure: SHOULDER ARTHROSCOPY WITH OPEN ROTATOR CUFF REPAIR;  Surgeon: Christena Flake, MD;  Location: [facility];  Service: Orthopedics;  Laterality: Right;  Shoulder Block    SINUS SURGERY WITH INSTATRAK     TONSILLECTOMY  1978   TUBAL LIGATION      There were no vitals filed for this visit.   Subjective Assessment - 10/07/22 0746     Subjective  It is better - I can tell my hand is stronger -done the execises-- the scar is much better- not as tender    Pertinent History Ortho visit 09/20/22: Assessment: Encounter Diagnoses Name Primary? History of arthroplasty of finger of left hand Yes CMC arthritis Primary osteoarthritis  Consulted and Agree with Plan of Care Patient             Patient will benefit from skilled therapeutic intervention in order to improve the following deficits and impairments:   Body Structure / Function / Physical Skills: ADL, Edema, Decreased knowledge of precautions, Flexibility, ROM, Scar mobility, Pain, Strength, IADL        Visit Diagnosis: Pain in left hand  Stiffness of left hand, not elsewhere classified  Scar tissue  Muscle weakness (generalized)    Problem List Patient Active Problem List   Diagnosis Date Noted   Mixed hyperlipidemia 01/29/2017   Vitamin D deficiency 01/29/2017   Biceps tendinitis of right upper extremity 08/02/2016   Complete tear of right rotator cuff 08/02/2016   Arteriosclerotic coronary artery disease 02/07/2015   Cerebral infarction (HCC) 11/03/2014   Eczema 11/03/2014   Hypertension 11/03/2014   Hyperthyroidism 11/03/2014   Migraine 11/03/2014   Hypercholesterolemia 11/11/2007   Allergic rhinitis 08/12/2007   Barrett's esophagus without dysplasia 08/12/2007   Gastro-esophageal reflux disease without esophagitis 08/12/2007    [name], OTR/L,CLT 10/07/2022, 8:54 AM  [facility] [address] [place], [address] Phone: [phone]   Fax:  [phone]  Name: KARLIYAH KRAUSSE MRN: [id number] Date of Birth: 19-Feb-1966  [facility] [address] [place], [place], [address] Phone: [phone]   Fax:  [phone]  Occupational Therapy Treatment  Patient Details  Name: Caitlyn Roth MRN: [id number] Date of Birth: August 04, 1965 Referring Provider (OT): Dr Joice Lofts   Encounter Date: 10/07/2022   OT End of Session - 10/07/22 0751     Visit Number 2    Number of Visits 8    Date for OT Re-Evaluation 11/15/22    OT Start Time 0715    OT Stop Time 0756    OT Time Calculation (min) 41 min    Activity Tolerance Patient tolerated treatment well    Behavior During Therapy [facility] for tasks assessed/performed             Past Medical History:  Diagnosis Date   A-fib [facility])    a.) CHA2DS2VASc = 6 (sex, HTN, CVA/TIA x2,  vascular disease history, T2DM);  b.) rate/rhythm maintained on oral metoprolol succinate; no chronic anticoagulation   Acute ST elevation myocardial infarction (STEMI) of inferior wall (HCC) 01/28/2015   a.) LHC/PCI 01/28/2015 --> 70% pRCA (3.5 x 20 mm Promus Premier)   ADHD (attention deficit hyperactivity disorder)    a.) on amphetamine-dextroamphetamine   Anxiety    Arthritis    Asthma    CAD (coronary artery disease) 01/28/2015   a.) LHC/PCI 01/28/2015 --> 70% pRCA (3.5 x 20 mm Promus Premier DES)   Diabetic polyneuropathy (HCC)    Diastolic dysfunction 01/28/2015   a.) TTE 01/28/2015: EF 50, inf/post HK, mild BAE, triv MR/TR, G1DD   Eczema    Family history of adverse reaction to anesthesia    a.) delayed emergence in 1st degree relative (father)   [name] corneal dystrophy    GERD (gastroesophageal reflux disease)    Graves disease    a.) on methimazole   Hepatic steatosis    Hyperlipemia    Hypertension    Migraines    Right rotator cuff tendinitis    Sleep apnea    Stroke (HCC) 2009   peripheral vision in left eye has been affected,   T2DM (type 2 diabetes mellitus) (HCC)    Vitamin B 12 deficiency     Past Surgical  History:  Procedure Laterality Date   CARPOMETACARPAL (CMC) FUSION OF THUMB Left 08/07/2022   Procedure: LEFT THUMB CMC ARTHROPLASTY;  Surgeon: Christena Flake, MD;  Location: [facility];  Service: Orthopedics;  Laterality: Left;   CESAREAN SECTION     CHOLECYSTECTOMY     CORONARY ANGIOPLASTY WITH STENT PLACEMENT Left 01/28/2015   Procedure: CORONARY ANGIOPLASTY WITH STENT PLACEMENT; Location: [facility]   KNEE ARTHROSCOPY Left    KNEE SURGERY Left 1990   McKay's procedure   SHOULDER ARTHROSCOPY WITH OPEN ROTATOR CUFF REPAIR Right 10/01/2016   Procedure: SHOULDER ARTHROSCOPY WITH OPEN ROTATOR CUFF REPAIR;  Surgeon: Christena Flake, MD;  Location: [facility];  Service: Orthopedics;  Laterality: Right;  Shoulder Block    SINUS SURGERY WITH INSTATRAK     TONSILLECTOMY  1978   TUBAL LIGATION      There were no vitals filed for this visit.   Subjective Assessment - 10/07/22 0746     Subjective  It is better - I can tell my hand is stronger -done the execises-- the scar is much better- not as tender    Pertinent History Ortho visit 09/20/22: Assessment: Encounter Diagnoses Name Primary? History of arthroplasty of finger of left hand Yes CMC arthritis Primary osteoarthritis

## 2022-10-10 ENCOUNTER — Encounter: Payer: BC Managed Care – PPO | Admitting: Occupational Therapy

## 2022-10-15 ENCOUNTER — Ambulatory Visit: Payer: BC Managed Care – PPO | Admitting: Occupational Therapy

## 2022-10-15 DIAGNOSIS — M25642 Stiffness of left hand, not elsewhere classified: Secondary | ICD-10-CM

## 2022-10-15 DIAGNOSIS — M6281 Muscle weakness (generalized): Secondary | ICD-10-CM

## 2022-10-15 DIAGNOSIS — M79642 Pain in left hand: Secondary | ICD-10-CM | POA: Diagnosis not present

## 2022-10-15 DIAGNOSIS — L905 Scar conditions and fibrosis of skin: Secondary | ICD-10-CM

## 2022-10-15 NOTE — Therapy (Signed)
Treatment/Interventions Self-care/ADL training;Contrast Bath;Fluidtherapy;Paraffin;Therapeutic exercise;DME and/or AE instruction;Manual Therapy;Scar mobilization;Splinting;Patient/family education;Passive range of motion    Consulted and Agree with Plan of Care Patient             Patient will benefit from skilled therapeutic intervention in order to improve the following deficits and impairments:   Body Structure / Function / Physical Skills: ADL, Edema, Decreased knowledge of precautions, Flexibility, ROM, Scar mobility, Pain, Strength, IADL       Visit Diagnosis: Pain in left hand  Stiffness  of left hand, not elsewhere classified  Scar tissue  Muscle weakness (generalized)    Problem List Patient Active Problem List   Diagnosis Date Noted   Mixed hyperlipidemia 01/29/2017   Vitamin D deficiency 01/29/2017   Biceps tendinitis of right upper extremity 08/02/2016   Complete tear of right rotator cuff 08/02/2016   Arteriosclerotic coronary artery disease 02/07/2015   Cerebral infarction (HCC) 11/03/2014   Eczema 11/03/2014   Hypertension 11/03/2014   Hyperthyroidism 11/03/2014   Migraine 11/03/2014   Hypercholesterolemia 11/11/2007   Allergic rhinitis 08/12/2007   Barrett's esophagus without dysplasia 08/12/2007   Gastro-esophageal reflux disease without esophagitis 08/12/2007    Caitlyn Roth, OTR/L,CLT 10/15/2022, 6:57 PM  [facility] [address] [place], [address] Phone: [phone]   Fax:  [phone]  Name: Caitlyn Roth MRN: [id number] Date of Birth: 1966/02/16  Treatment/Interventions Self-care/ADL training;Contrast Bath;Fluidtherapy;Paraffin;Therapeutic exercise;DME and/or AE instruction;Manual Therapy;Scar mobilization;Splinting;Patient/family education;Passive range of motion    Consulted and Agree with Plan of Care Patient             Patient will benefit from skilled therapeutic intervention in order to improve the following deficits and impairments:   Body Structure / Function / Physical Skills: ADL, Edema, Decreased knowledge of precautions, Flexibility, ROM, Scar mobility, Pain, Strength, IADL       Visit Diagnosis: Pain in left hand  Stiffness  of left hand, not elsewhere classified  Scar tissue  Muscle weakness (generalized)    Problem List Patient Active Problem List   Diagnosis Date Noted   Mixed hyperlipidemia 01/29/2017   Vitamin D deficiency 01/29/2017   Biceps tendinitis of right upper extremity 08/02/2016   Complete tear of right rotator cuff 08/02/2016   Arteriosclerotic coronary artery disease 02/07/2015   Cerebral infarction (HCC) 11/03/2014   Eczema 11/03/2014   Hypertension 11/03/2014   Hyperthyroidism 11/03/2014   Migraine 11/03/2014   Hypercholesterolemia 11/11/2007   Allergic rhinitis 08/12/2007   Barrett's esophagus without dysplasia 08/12/2007   Gastro-esophageal reflux disease without esophagitis 08/12/2007    Caitlyn Roth, OTR/L,CLT 10/15/2022, 6:57 PM  [facility] [address] [place], [address] Phone: [phone]   Fax:  [phone]  Name: Caitlyn Roth MRN: [id number] Date of Birth: 1966/02/16  [facility] [address] [place], [place], [address] Phone: [phone]   Fax:  [phone]  Occupational Therapy Treatment  Patient Details  Name: Caitlyn Roth MRN: [id number] Date of Birth: 12-10-1965 Referring Provider (OT): Dr Joice Lofts   Encounter Date: 10/15/2022   OT End of Session - 10/15/22 1852     Visit Number 3    Number of Visits 8    Date for OT Re-Evaluation 11/15/22    OT Start Time 1615    OT Stop Time 1655    OT Time Calculation (min) 40 min    Activity Tolerance Patient tolerated treatment well    Behavior During Therapy [facility] for tasks assessed/performed             Past Medical History:  Diagnosis Date   A-fib [facility])    a.) CHA2DS2VASc = 6 (sex, HTN, CVA/TIA x2,  vascular disease history, T2DM);  b.) rate/rhythm maintained on oral metoprolol succinate; no chronic anticoagulation   Acute ST elevation myocardial infarction (STEMI) of inferior wall (HCC) 01/28/2015   a.) LHC/PCI 01/28/2015 --> 70% pRCA (3.5 x 20 mm Promus Premier)   ADHD (attention deficit hyperactivity disorder)    a.) on amphetamine-dextroamphetamine   Anxiety    Arthritis    Asthma    CAD (coronary artery disease) 01/28/2015   a.) LHC/PCI 01/28/2015 --> 70% pRCA (3.5 x 20 mm Promus Premier DES)   Diabetic polyneuropathy (HCC)    Diastolic dysfunction 01/28/2015   a.) TTE 01/28/2015: EF 50, inf/post HK, mild BAE, triv MR/TR, G1DD   Eczema    Family history of adverse reaction to anesthesia    a.) delayed emergence in 1st degree relative (father)   [name] corneal dystrophy    GERD (gastroesophageal reflux disease)    Graves disease    a.) on methimazole   Hepatic steatosis    Hyperlipemia    Hypertension    Migraines    Right rotator cuff tendinitis    Sleep apnea    Stroke (HCC) 2009   peripheral vision in left eye has been affected,   T2DM (type 2 diabetes mellitus) (HCC)    Vitamin B 12 deficiency     Past Surgical  History:  Procedure Laterality Date   CARPOMETACARPAL (CMC) FUSION OF THUMB Left 08/07/2022   Procedure: LEFT THUMB CMC ARTHROPLASTY;  Surgeon: Christena Flake, MD;  Location: [facility];  Service: Orthopedics;  Laterality: Left;   CESAREAN SECTION     CHOLECYSTECTOMY     CORONARY ANGIOPLASTY WITH STENT PLACEMENT Left 01/28/2015   Procedure: CORONARY ANGIOPLASTY WITH STENT PLACEMENT; Location: [facility]   KNEE ARTHROSCOPY Left    KNEE SURGERY Left 1990   McKay's procedure   SHOULDER ARTHROSCOPY WITH OPEN ROTATOR CUFF REPAIR Right 10/01/2016   Procedure: SHOULDER ARTHROSCOPY WITH OPEN ROTATOR CUFF REPAIR;  Surgeon: Christena Flake, MD;  Location: [facility];  Service: Orthopedics;  Laterality: Right;  Shoulder Block    SINUS SURGERY WITH INSTATRAK     TONSILLECTOMY  1978   TUBAL LIGATION      There were no vitals filed for this visit.   Subjective Assessment - 10/15/22 1850     Subjective  My thumb has been hurting - I maybe done to much of the putty - I sit and work with it at work - and been wearing the soft black one most all the time    Pertinent History Ortho visit 09/20/22: Assessment: Encounter Diagnoses Name Primary?  [facility] [address] [place], [place], [address] Phone: [phone]   Fax:  [phone]  Occupational Therapy Treatment  Patient Details  Name: Caitlyn Roth MRN: [id number] Date of Birth: 12-10-1965 Referring Provider (OT): Dr Joice Lofts   Encounter Date: 10/15/2022   OT End of Session - 10/15/22 1852     Visit Number 3    Number of Visits 8    Date for OT Re-Evaluation 11/15/22    OT Start Time 1615    OT Stop Time 1655    OT Time Calculation (min) 40 min    Activity Tolerance Patient tolerated treatment well    Behavior During Therapy [facility] for tasks assessed/performed             Past Medical History:  Diagnosis Date   A-fib [facility])    a.) CHA2DS2VASc = 6 (sex, HTN, CVA/TIA x2,  vascular disease history, T2DM);  b.) rate/rhythm maintained on oral metoprolol succinate; no chronic anticoagulation   Acute ST elevation myocardial infarction (STEMI) of inferior wall (HCC) 01/28/2015   a.) LHC/PCI 01/28/2015 --> 70% pRCA (3.5 x 20 mm Promus Premier)   ADHD (attention deficit hyperactivity disorder)    a.) on amphetamine-dextroamphetamine   Anxiety    Arthritis    Asthma    CAD (coronary artery disease) 01/28/2015   a.) LHC/PCI 01/28/2015 --> 70% pRCA (3.5 x 20 mm Promus Premier DES)   Diabetic polyneuropathy (HCC)    Diastolic dysfunction 01/28/2015   a.) TTE 01/28/2015: EF 50, inf/post HK, mild BAE, triv MR/TR, G1DD   Eczema    Family history of adverse reaction to anesthesia    a.) delayed emergence in 1st degree relative (father)   [name] corneal dystrophy    GERD (gastroesophageal reflux disease)    Graves disease    a.) on methimazole   Hepatic steatosis    Hyperlipemia    Hypertension    Migraines    Right rotator cuff tendinitis    Sleep apnea    Stroke (HCC) 2009   peripheral vision in left eye has been affected,   T2DM (type 2 diabetes mellitus) (HCC)    Vitamin B 12 deficiency     Past Surgical  History:  Procedure Laterality Date   CARPOMETACARPAL (CMC) FUSION OF THUMB Left 08/07/2022   Procedure: LEFT THUMB CMC ARTHROPLASTY;  Surgeon: Christena Flake, MD;  Location: [facility];  Service: Orthopedics;  Laterality: Left;   CESAREAN SECTION     CHOLECYSTECTOMY     CORONARY ANGIOPLASTY WITH STENT PLACEMENT Left 01/28/2015   Procedure: CORONARY ANGIOPLASTY WITH STENT PLACEMENT; Location: [facility]   KNEE ARTHROSCOPY Left    KNEE SURGERY Left 1990   McKay's procedure   SHOULDER ARTHROSCOPY WITH OPEN ROTATOR CUFF REPAIR Right 10/01/2016   Procedure: SHOULDER ARTHROSCOPY WITH OPEN ROTATOR CUFF REPAIR;  Surgeon: Christena Flake, MD;  Location: [facility];  Service: Orthopedics;  Laterality: Right;  Shoulder Block    SINUS SURGERY WITH INSTATRAK     TONSILLECTOMY  1978   TUBAL LIGATION      There were no vitals filed for this visit.   Subjective Assessment - 10/15/22 1850     Subjective  My thumb has been hurting - I maybe done to much of the putty - I sit and work with it at work - and been wearing the soft black one most all the time    Pertinent History Ortho visit 09/20/22: Assessment: Encounter Diagnoses Name Primary?

## 2022-10-21 ENCOUNTER — Ambulatory Visit: Payer: BC Managed Care – PPO | Attending: Surgery | Admitting: Occupational Therapy

## 2022-10-21 DIAGNOSIS — M25642 Stiffness of left hand, not elsewhere classified: Secondary | ICD-10-CM | POA: Diagnosis present

## 2022-10-21 DIAGNOSIS — M79642 Pain in left hand: Secondary | ICD-10-CM | POA: Insufficient documentation

## 2022-10-21 DIAGNOSIS — L905 Scar conditions and fibrosis of skin: Secondary | ICD-10-CM | POA: Insufficient documentation

## 2022-10-21 DIAGNOSIS — M6281 Muscle weakness (generalized): Secondary | ICD-10-CM | POA: Insufficient documentation

## 2022-10-21 NOTE — Therapy (Signed)
[facility] [address] [place], [place], [address] Phone: [phone]   Fax:  [phone]  Occupational Therapy Treatment  Patient Details  Name: Caitlyn Roth MRN: [id number] Date of Birth: September 03, 1965 Referring Provider (OT): Dr Joice Lofts   Encounter Date: 10/21/2022   OT End of Session - 10/21/22 1646     Visit Number 4    Number of Visits 8    Date for OT Re-Evaluation 11/15/22    OT Start Time 1646    OT Stop Time 1720    OT Time Calculation (min) 34 min    Activity Tolerance Patient tolerated treatment well    Behavior During Therapy [facility] for tasks assessed/performed             Past Medical History:  Diagnosis Date   A-fib [facility])    a.) CHA2DS2VASc = 6 (sex, HTN, CVA/TIA x2,  vascular disease history, T2DM);  b.) rate/rhythm maintained on oral metoprolol succinate; no chronic anticoagulation   Acute ST elevation myocardial infarction (STEMI) of inferior wall (HCC) 01/28/2015   a.) LHC/PCI 01/28/2015 --> 70% pRCA (3.5 x 20 mm Promus Premier)   ADHD (attention deficit hyperactivity disorder)    a.) on amphetamine-dextroamphetamine   Anxiety    Arthritis    Asthma    CAD (coronary artery disease) 01/28/2015   a.) LHC/PCI 01/28/2015 --> 70% pRCA (3.5 x 20 mm Promus Premier DES)   Diabetic polyneuropathy (HCC)    Diastolic dysfunction 01/28/2015   a.) TTE 01/28/2015: EF 50, inf/post HK, mild BAE, triv MR/TR, G1DD   Eczema    Family history of adverse reaction to anesthesia    a.) delayed emergence in 1st degree relative (father)   [name] corneal dystrophy    GERD (gastroesophageal reflux disease)    Graves disease    a.) on methimazole   Hepatic steatosis    Hyperlipemia    Hypertension    Migraines    Right rotator cuff tendinitis    Sleep apnea    Stroke (HCC) 2009   peripheral vision in left eye has been affected,   T2DM (type 2 diabetes mellitus) (HCC)    Vitamin B 12 deficiency     Past Surgical  History:  Procedure Laterality Date   CARPOMETACARPAL (CMC) FUSION OF THUMB Left 08/07/2022   Procedure: LEFT THUMB CMC ARTHROPLASTY;  Surgeon: Christena Flake, MD;  Location: [facility];  Service: Orthopedics;  Laterality: Left;   CESAREAN SECTION     CHOLECYSTECTOMY     CORONARY ANGIOPLASTY WITH STENT PLACEMENT Left 01/28/2015   Procedure: CORONARY ANGIOPLASTY WITH STENT PLACEMENT; Location: [facility]   KNEE ARTHROSCOPY Left    KNEE SURGERY Left 1990   McKay's procedure   SHOULDER ARTHROSCOPY WITH OPEN ROTATOR CUFF REPAIR Right 10/01/2016   Procedure: SHOULDER ARTHROSCOPY WITH OPEN ROTATOR CUFF REPAIR;  Surgeon: Christena Flake, MD;  Location: [facility];  Service: Orthopedics;  Laterality: Right;  Shoulder Block    SINUS SURGERY WITH INSTATRAK     TONSILLECTOMY  1978   TUBAL LIGATION      There were no vitals filed for this visit.   Subjective Assessment - 10/21/22 1646     Subjective  Doing much better- I held off on the putty -and used the soft splint when out and about - pain better    Pertinent History Ortho visit 09/20/22: Assessment: Encounter Diagnoses Name Primary? History of arthroplasty of finger of left hand Yes CMC arthritis Primary osteoarthritis of  Vitamin D deficiency 01/29/2017   Biceps tendinitis of right upper extremity 08/02/2016   Complete tear of right rotator cuff 08/02/2016   Arteriosclerotic coronary artery disease 02/07/2015   Cerebral infarction (HCC) 11/03/2014   Eczema 11/03/2014   Hypertension 11/03/2014   Hyperthyroidism 11/03/2014   Migraine 11/03/2014   Hypercholesterolemia 11/11/2007   Allergic rhinitis 08/12/2007   Barrett's  esophagus without dysplasia 08/12/2007   Gastro-esophageal reflux disease without esophagitis 08/12/2007    [name], OTR/L,CLT 10/21/2022, 5:26 PM  [facility] [address], [place], [address] Phone: [phone]   Fax:  [phone]  Name: ZOBEIDA THUMMEL MRN: [id number] Date of Birth: 1965-10-17  [facility] [address] [place], [place], [address] Phone: [phone]   Fax:  [phone]  Occupational Therapy Treatment  Patient Details  Name: Caitlyn Roth MRN: [id number] Date of Birth: September 03, 1965 Referring Provider (OT): Dr Joice Lofts   Encounter Date: 10/21/2022   OT End of Session - 10/21/22 1646     Visit Number 4    Number of Visits 8    Date for OT Re-Evaluation 11/15/22    OT Start Time 1646    OT Stop Time 1720    OT Time Calculation (min) 34 min    Activity Tolerance Patient tolerated treatment well    Behavior During Therapy [facility] for tasks assessed/performed             Past Medical History:  Diagnosis Date   A-fib [facility])    a.) CHA2DS2VASc = 6 (sex, HTN, CVA/TIA x2,  vascular disease history, T2DM);  b.) rate/rhythm maintained on oral metoprolol succinate; no chronic anticoagulation   Acute ST elevation myocardial infarction (STEMI) of inferior wall (HCC) 01/28/2015   a.) LHC/PCI 01/28/2015 --> 70% pRCA (3.5 x 20 mm Promus Premier)   ADHD (attention deficit hyperactivity disorder)    a.) on amphetamine-dextroamphetamine   Anxiety    Arthritis    Asthma    CAD (coronary artery disease) 01/28/2015   a.) LHC/PCI 01/28/2015 --> 70% pRCA (3.5 x 20 mm Promus Premier DES)   Diabetic polyneuropathy (HCC)    Diastolic dysfunction 01/28/2015   a.) TTE 01/28/2015: EF 50, inf/post HK, mild BAE, triv MR/TR, G1DD   Eczema    Family history of adverse reaction to anesthesia    a.) delayed emergence in 1st degree relative (father)   [name] corneal dystrophy    GERD (gastroesophageal reflux disease)    Graves disease    a.) on methimazole   Hepatic steatosis    Hyperlipemia    Hypertension    Migraines    Right rotator cuff tendinitis    Sleep apnea    Stroke (HCC) 2009   peripheral vision in left eye has been affected,   T2DM (type 2 diabetes mellitus) (HCC)    Vitamin B 12 deficiency     Past Surgical  History:  Procedure Laterality Date   CARPOMETACARPAL (CMC) FUSION OF THUMB Left 08/07/2022   Procedure: LEFT THUMB CMC ARTHROPLASTY;  Surgeon: Christena Flake, MD;  Location: [facility];  Service: Orthopedics;  Laterality: Left;   CESAREAN SECTION     CHOLECYSTECTOMY     CORONARY ANGIOPLASTY WITH STENT PLACEMENT Left 01/28/2015   Procedure: CORONARY ANGIOPLASTY WITH STENT PLACEMENT; Location: [facility]   KNEE ARTHROSCOPY Left    KNEE SURGERY Left 1990   McKay's procedure   SHOULDER ARTHROSCOPY WITH OPEN ROTATOR CUFF REPAIR Right 10/01/2016   Procedure: SHOULDER ARTHROSCOPY WITH OPEN ROTATOR CUFF REPAIR;  Surgeon: Christena Flake, MD;  Location: [facility];  Service: Orthopedics;  Laterality: Right;  Shoulder Block    SINUS SURGERY WITH INSTATRAK     TONSILLECTOMY  1978   TUBAL LIGATION      There were no vitals filed for this visit.   Subjective Assessment - 10/21/22 1646     Subjective  Doing much better- I held off on the putty -and used the soft splint when out and about - pain better    Pertinent History Ortho visit 09/20/22: Assessment: Encounter Diagnoses Name Primary? History of arthroplasty of finger of left hand Yes CMC arthritis Primary osteoarthritis of  Vitamin D deficiency 01/29/2017   Biceps tendinitis of right upper extremity 08/02/2016   Complete tear of right rotator cuff 08/02/2016   Arteriosclerotic coronary artery disease 02/07/2015   Cerebral infarction (HCC) 11/03/2014   Eczema 11/03/2014   Hypertension 11/03/2014   Hyperthyroidism 11/03/2014   Migraine 11/03/2014   Hypercholesterolemia 11/11/2007   Allergic rhinitis 08/12/2007   Barrett's  esophagus without dysplasia 08/12/2007   Gastro-esophageal reflux disease without esophagitis 08/12/2007    [name], OTR/L,CLT 10/21/2022, 5:26 PM  [facility] [address], [place], [address] Phone: [phone]   Fax:  [phone]  Name: ZOBEIDA THUMMEL MRN: [id number] Date of Birth: 1965-10-17

## 2022-10-28 ENCOUNTER — Ambulatory Visit: Payer: BC Managed Care – PPO | Admitting: Occupational Therapy

## 2022-10-28 DIAGNOSIS — M79642 Pain in left hand: Secondary | ICD-10-CM | POA: Diagnosis not present

## 2022-10-28 DIAGNOSIS — L905 Scar conditions and fibrosis of skin: Secondary | ICD-10-CM

## 2022-10-28 DIAGNOSIS — M6281 Muscle weakness (generalized): Secondary | ICD-10-CM

## 2022-10-28 DIAGNOSIS — M25642 Stiffness of left hand, not elsewhere classified: Secondary | ICD-10-CM

## 2022-10-28 NOTE — Therapy (Signed)
[facility] [address] [place], [place], [address] Phone: [phone]   Fax:  [phone]  Occupational Therapy Treatment  Patient Details  Name: Caitlyn Roth MRN: [id number] Date of Birth: March 15, 1966 Referring Provider (OT): Dr Joice Lofts   Encounter Date: 10/28/2022   OT End of Session - 10/28/22 1658     Visit Number 5    Number of Visits 8    Date for OT Re-Evaluation 11/15/22    OT Start Time 1649    OT Stop Time 1731    OT Time Calculation (min) 42 min    Activity Tolerance Patient tolerated treatment well    Behavior During Therapy [facility] for tasks assessed/performed             Past Medical History:  Diagnosis Date   A-fib [facility])    a.) CHA2DS2VASc = 6 (sex, HTN, CVA/TIA x2,  vascular disease history, T2DM);  b.) rate/rhythm maintained on oral metoprolol succinate; no chronic anticoagulation   Acute ST elevation myocardial infarction (STEMI) of inferior wall (HCC) 01/28/2015   a.) LHC/PCI 01/28/2015 --> 70% pRCA (3.5 x 20 mm Promus Premier)   ADHD (attention deficit hyperactivity disorder)    a.) on amphetamine-dextroamphetamine   Anxiety    Arthritis    Asthma    CAD (coronary artery disease) 01/28/2015   a.) LHC/PCI 01/28/2015 --> 70% pRCA (3.5 x 20 mm Promus Premier DES)   Diabetic polyneuropathy (HCC)    Diastolic dysfunction 01/28/2015   a.) TTE 01/28/2015: EF 50, inf/post HK, mild BAE, triv MR/TR, G1DD   Eczema    Family history of adverse reaction to anesthesia    a.) delayed emergence in 1st degree relative (father)   [name] corneal dystrophy    GERD (gastroesophageal reflux disease)    Graves disease    a.) on methimazole   Hepatic steatosis    Hyperlipemia    Hypertension    Migraines    Right rotator cuff tendinitis    Sleep apnea    Stroke (HCC) 2009   peripheral vision in left eye has been affected,   T2DM (type 2 diabetes mellitus) (HCC)    Vitamin B 12 deficiency     Past Surgical  History:  Procedure Laterality Date   CARPOMETACARPAL (CMC) FUSION OF THUMB Left 08/07/2022   Procedure: LEFT THUMB CMC ARTHROPLASTY;  Surgeon: Christena Flake, MD;  Location: [facility] ORS;  Service: Orthopedics;  Laterality: Left;   CESAREAN SECTION     CHOLECYSTECTOMY     CORONARY ANGIOPLASTY WITH STENT PLACEMENT Left 01/28/2015   Procedure: CORONARY ANGIOPLASTY WITH STENT PLACEMENT; Location: [facility]   KNEE ARTHROSCOPY Left    KNEE SURGERY Left 1990   McKay's procedure   SHOULDER ARTHROSCOPY WITH OPEN ROTATOR CUFF REPAIR Right 10/01/2016   Procedure: SHOULDER ARTHROSCOPY WITH OPEN ROTATOR CUFF REPAIR;  Surgeon: Christena Flake, MD;  Location: [facility];  Service: Orthopedics;  Laterality: Right;  Shoulder Block    SINUS SURGERY WITH INSTATRAK     TONSILLECTOMY  1978   TUBAL LIGATION      There were no vitals filed for this visit.   Subjective Assessment - 10/28/22 1656     Subjective  I mowed the lawn Sat - pushing lawn mower around the smaller areas and my thumb hurts yesterday and today -  need to look for my soft black splint - done my exercises  today    Pertinent History Ortho visit 09/20/22: Assessment: Encounter Diagnoses Name  [facility] [address] [place], [place], [address] Phone: [phone]   Fax:  [phone]  Occupational Therapy Treatment  Patient Details  Name: Caitlyn Roth MRN: [id number] Date of Birth: March 15, 1966 Referring Provider (OT): Dr Joice Lofts   Encounter Date: 10/28/2022   OT End of Session - 10/28/22 1658     Visit Number 5    Number of Visits 8    Date for OT Re-Evaluation 11/15/22    OT Start Time 1649    OT Stop Time 1731    OT Time Calculation (min) 42 min    Activity Tolerance Patient tolerated treatment well    Behavior During Therapy [facility] for tasks assessed/performed             Past Medical History:  Diagnosis Date   A-fib [facility])    a.) CHA2DS2VASc = 6 (sex, HTN, CVA/TIA x2,  vascular disease history, T2DM);  b.) rate/rhythm maintained on oral metoprolol succinate; no chronic anticoagulation   Acute ST elevation myocardial infarction (STEMI) of inferior wall (HCC) 01/28/2015   a.) LHC/PCI 01/28/2015 --> 70% pRCA (3.5 x 20 mm Promus Premier)   ADHD (attention deficit hyperactivity disorder)    a.) on amphetamine-dextroamphetamine   Anxiety    Arthritis    Asthma    CAD (coronary artery disease) 01/28/2015   a.) LHC/PCI 01/28/2015 --> 70% pRCA (3.5 x 20 mm Promus Premier DES)   Diabetic polyneuropathy (HCC)    Diastolic dysfunction 01/28/2015   a.) TTE 01/28/2015: EF 50, inf/post HK, mild BAE, triv MR/TR, G1DD   Eczema    Family history of adverse reaction to anesthesia    a.) delayed emergence in 1st degree relative (father)   [name] corneal dystrophy    GERD (gastroesophageal reflux disease)    Graves disease    a.) on methimazole   Hepatic steatosis    Hyperlipemia    Hypertension    Migraines    Right rotator cuff tendinitis    Sleep apnea    Stroke (HCC) 2009   peripheral vision in left eye has been affected,   T2DM (type 2 diabetes mellitus) (HCC)    Vitamin B 12 deficiency     Past Surgical  History:  Procedure Laterality Date   CARPOMETACARPAL (CMC) FUSION OF THUMB Left 08/07/2022   Procedure: LEFT THUMB CMC ARTHROPLASTY;  Surgeon: Christena Flake, MD;  Location: [facility];  Service: Orthopedics;  Laterality: Left;   CESAREAN SECTION     CHOLECYSTECTOMY     CORONARY ANGIOPLASTY WITH STENT PLACEMENT Left 01/28/2015   Procedure: CORONARY ANGIOPLASTY WITH STENT PLACEMENT; Location: [facility]   KNEE ARTHROSCOPY Left    KNEE SURGERY Left 1990   McKay's procedure   SHOULDER ARTHROSCOPY WITH OPEN ROTATOR CUFF REPAIR Right 10/01/2016   Procedure: SHOULDER ARTHROSCOPY WITH OPEN ROTATOR CUFF REPAIR;  Surgeon: Christena Flake, MD;  Location: [facility];  Service: Orthopedics;  Laterality: Right;  Shoulder Block    SINUS SURGERY WITH INSTATRAK     TONSILLECTOMY  1978   TUBAL LIGATION      There were no vitals filed for this visit.   Subjective Assessment - 10/28/22 1656     Subjective  I mowed the lawn Sat - pushing lawn mower around the smaller areas and my thumb hurts yesterday and today -  need to look for my soft black splint - done my exercises  today    Pertinent History Ortho visit 09/20/22: Assessment: Encounter Diagnoses Name  Patient will benefit from skilled therapeutic intervention in order to improve the following deficits and impairments:   Body Structure / Function / Physical Skills: ADL, Edema, Decreased knowledge of precautions, Flexibility, ROM, Scar mobility, Pain, Strength, IADL       Visit Diagnosis: Pain in left hand  Stiffness of left hand, not elsewhere classified  Scar tissue  Muscle weakness (generalized)    Problem  List Patient Active Problem List   Diagnosis Date Noted   Mixed hyperlipidemia 01/29/2017   Vitamin D deficiency 01/29/2017   Biceps tendinitis of right upper extremity 08/02/2016   Complete tear of right rotator cuff 08/02/2016   Arteriosclerotic coronary artery disease 02/07/2015   Cerebral infarction (HCC) 11/03/2014   Eczema 11/03/2014   Hypertension 11/03/2014   Hyperthyroidism 11/03/2014   Migraine 11/03/2014   Hypercholesterolemia 11/11/2007   Allergic rhinitis 08/12/2007   Barrett's esophagus without dysplasia 08/12/2007   Gastro-esophageal reflux disease without esophagitis 08/12/2007    [name], OTR/L,CLT 10/28/2022, 5:35 PM  [facility] [address] [place], [address] Phone: [phone]   Fax:  [phone]  Name: Caitlyn Roth MRN: [id number] Date of Birth: 02/09/1966  Patient will benefit from skilled therapeutic intervention in order to improve the following deficits and impairments:   Body Structure / Function / Physical Skills: ADL, Edema, Decreased knowledge of precautions, Flexibility, ROM, Scar mobility, Pain, Strength, IADL       Visit Diagnosis: Pain in left hand  Stiffness of left hand, not elsewhere classified  Scar tissue  Muscle weakness (generalized)    Problem  List Patient Active Problem List   Diagnosis Date Noted   Mixed hyperlipidemia 01/29/2017   Vitamin D deficiency 01/29/2017   Biceps tendinitis of right upper extremity 08/02/2016   Complete tear of right rotator cuff 08/02/2016   Arteriosclerotic coronary artery disease 02/07/2015   Cerebral infarction (HCC) 11/03/2014   Eczema 11/03/2014   Hypertension 11/03/2014   Hyperthyroidism 11/03/2014   Migraine 11/03/2014   Hypercholesterolemia 11/11/2007   Allergic rhinitis 08/12/2007   Barrett's esophagus without dysplasia 08/12/2007   Gastro-esophageal reflux disease without esophagitis 08/12/2007    [name], OTR/L,CLT 10/28/2022, 5:35 PM  [facility] [address] [place], [address] Phone: [phone]   Fax:  [phone]  Name: Caitlyn Roth MRN: [id number] Date of Birth: 02/09/1966

## 2022-11-04 ENCOUNTER — Encounter: Payer: BC Managed Care – PPO | Admitting: Occupational Therapy

## 2022-11-07 ENCOUNTER — Ambulatory Visit: Payer: BC Managed Care – PPO | Admitting: Occupational Therapy

## 2022-11-07 DIAGNOSIS — L905 Scar conditions and fibrosis of skin: Secondary | ICD-10-CM

## 2022-11-07 DIAGNOSIS — M79642 Pain in left hand: Secondary | ICD-10-CM

## 2022-11-07 DIAGNOSIS — M25642 Stiffness of left hand, not elsewhere classified: Secondary | ICD-10-CM

## 2022-11-07 DIAGNOSIS — M6281 Muscle weakness (generalized): Secondary | ICD-10-CM

## 2022-11-07 NOTE — Therapy (Signed)
Edema, Decreased knowledge of precautions, Flexibility, ROM, Scar mobility, Pain, Strength, IADL       Visit Diagnosis: Pain in left hand  Stiffness of left hand, not elsewhere classified  Scar tissue  Muscle weakness (generalized)    Problem List Patient Active Problem List   Diagnosis Date Noted   Mixed hyperlipidemia 01/29/2017   Vitamin D deficiency 01/29/2017   Biceps tendinitis of right upper extremity 08/02/2016   Complete tear of  right rotator cuff 08/02/2016   Arteriosclerotic coronary artery disease 02/07/2015   Cerebral infarction (HCC) 11/03/2014   Eczema 11/03/2014   Hypertension 11/03/2014   Hyperthyroidism 11/03/2014   Migraine 11/03/2014   Hypercholesterolemia 11/11/2007   Allergic rhinitis 08/12/2007   Barrett's esophagus without dysplasia 08/12/2007   Gastro-esophageal reflux disease without esophagitis 08/12/2007    [name], OTR/L,CLT 11/07/2022, 5:19 PM  [facility] [address], [place], [address] Phone: [phone]   Fax:  [phone]  Name: Caitlyn Roth MRN: [id number] Date of Birth: 03/24/1966  [facility] [address] [place], [place], [address] Phone: [phone]   Fax:  [phone]  Occupational Therapy Treatment  Patient Details  Name: Caitlyn Roth MRN: [id number] Date of Birth: 14-Jul-1965 Referring Provider (OT): Dr Joice Lofts   Encounter Date: 11/07/2022   OT End of Session - 11/07/22 1714     Visit Number 6    Number of Visits 8    Date for OT Re-Evaluation 11/15/22    OT Start Time 1615    OT Stop Time 1710    OT Time Calculation (min) 55 min    Activity Tolerance Patient tolerated treatment well    Behavior During Therapy [facility] for tasks assessed/performed             Past Medical History:  Diagnosis Date   A-fib [facility])    a.) CHA2DS2VASc = 6 (sex, HTN, CVA/TIA x2,  vascular disease history, T2DM);  b.) rate/rhythm maintained on oral metoprolol succinate; no chronic anticoagulation   Acute ST elevation myocardial infarction (STEMI) of inferior wall (HCC) 01/28/2015   a.) LHC/PCI 01/28/2015 --> 70% pRCA (3.5 x 20 mm Promus Premier)   ADHD (attention deficit hyperactivity disorder)    a.) on amphetamine-dextroamphetamine   Anxiety    Arthritis    Asthma    CAD (coronary artery disease) 01/28/2015   a.) LHC/PCI 01/28/2015 --> 70% pRCA (3.5 x 20 mm Promus Premier DES)   Diabetic polyneuropathy (HCC)    Diastolic dysfunction 01/28/2015   a.) TTE 01/28/2015: EF 50, inf/post HK, mild BAE, triv MR/TR, G1DD   Eczema    Family history of adverse reaction to anesthesia    a.) delayed emergence in 1st degree relative (father)   [name] corneal dystrophy    GERD (gastroesophageal reflux disease)    Graves disease    a.) on methimazole   Hepatic steatosis    Hyperlipemia    Hypertension    Migraines    Right rotator cuff tendinitis    Sleep apnea    Stroke (HCC) 2009   peripheral vision in left eye has been affected,   T2DM (type 2 diabetes mellitus) (HCC)    Vitamin B 12 deficiency     Past Surgical  History:  Procedure Laterality Date   CARPOMETACARPAL (CMC) FUSION OF THUMB Left 08/07/2022   Procedure: LEFT THUMB CMC ARTHROPLASTY;  Surgeon: Christena Flake, MD;  Location: [facility];  Service: Orthopedics;  Laterality: Left;   CESAREAN SECTION     CHOLECYSTECTOMY     CORONARY ANGIOPLASTY WITH STENT PLACEMENT Left 01/28/2015   Procedure: CORONARY ANGIOPLASTY WITH STENT PLACEMENT; Location: [facility]   KNEE ARTHROSCOPY Left    KNEE SURGERY Left 1990   McKay's procedure   SHOULDER ARTHROSCOPY WITH OPEN ROTATOR CUFF REPAIR Right 10/01/2016   Procedure: SHOULDER ARTHROSCOPY WITH OPEN ROTATOR CUFF REPAIR;  Surgeon: Christena Flake, MD;  Location: [facility];  Service: Orthopedics;  Laterality: Right;  Shoulder Block    SINUS SURGERY WITH INSTATRAK     TONSILLECTOMY  1978   TUBAL LIGATION      There were no vitals filed for this visit.   Subjective Assessment - 11/07/22 1712     Subjective  My thumb has been good the last 3 days- wore the soft today - doing okay - Monday it was hurting but doing okay    Pertinent History Ortho visit 09/20/22: Assessment: Encounter Diagnoses Name Primary? History of arthroplasty of finger of left hand Yes CMC arthritis  Edema, Decreased knowledge of precautions, Flexibility, ROM, Scar mobility, Pain, Strength, IADL       Visit Diagnosis: Pain in left hand  Stiffness of left hand, not elsewhere classified  Scar tissue  Muscle weakness (generalized)    Problem List Patient Active Problem List   Diagnosis Date Noted   Mixed hyperlipidemia 01/29/2017   Vitamin D deficiency 01/29/2017   Biceps tendinitis of right upper extremity 08/02/2016   Complete tear of  right rotator cuff 08/02/2016   Arteriosclerotic coronary artery disease 02/07/2015   Cerebral infarction (HCC) 11/03/2014   Eczema 11/03/2014   Hypertension 11/03/2014   Hyperthyroidism 11/03/2014   Migraine 11/03/2014   Hypercholesterolemia 11/11/2007   Allergic rhinitis 08/12/2007   Barrett's esophagus without dysplasia 08/12/2007   Gastro-esophageal reflux disease without esophagitis 08/12/2007    [name], OTR/L,CLT 11/07/2022, 5:19 PM  [facility] [address], [place], [address] Phone: [phone]   Fax:  [phone]  Name: Caitlyn Roth MRN: [id number] Date of Birth: 03/24/1966  Edema, Decreased knowledge of precautions, Flexibility, ROM, Scar mobility, Pain, Strength, IADL       Visit Diagnosis: Pain in left hand  Stiffness of left hand, not elsewhere classified  Scar tissue  Muscle weakness (generalized)    Problem List Patient Active Problem List   Diagnosis Date Noted   Mixed hyperlipidemia 01/29/2017   Vitamin D deficiency 01/29/2017   Biceps tendinitis of right upper extremity 08/02/2016   Complete tear of  right rotator cuff 08/02/2016   Arteriosclerotic coronary artery disease 02/07/2015   Cerebral infarction (HCC) 11/03/2014   Eczema 11/03/2014   Hypertension 11/03/2014   Hyperthyroidism 11/03/2014   Migraine 11/03/2014   Hypercholesterolemia 11/11/2007   Allergic rhinitis 08/12/2007   Barrett's esophagus without dysplasia 08/12/2007   Gastro-esophageal reflux disease without esophagitis 08/12/2007    [name], OTR/L,CLT 11/07/2022, 5:19 PM  [facility] [address], [place], [address] Phone: [phone]   Fax:  [phone]  Name: Caitlyn Roth MRN: [id number] Date of Birth: 03/24/1966

## 2022-11-22 ENCOUNTER — Ambulatory Visit: Payer: BC Managed Care – PPO | Attending: Surgery | Admitting: Occupational Therapy

## 2022-11-22 DIAGNOSIS — M25642 Stiffness of left hand, not elsewhere classified: Secondary | ICD-10-CM | POA: Diagnosis present

## 2022-11-22 DIAGNOSIS — M79642 Pain in left hand: Secondary | ICD-10-CM | POA: Diagnosis present

## 2022-11-22 DIAGNOSIS — L905 Scar conditions and fibrosis of skin: Secondary | ICD-10-CM | POA: Diagnosis present

## 2022-11-22 DIAGNOSIS — M6281 Muscle weakness (generalized): Secondary | ICD-10-CM | POA: Insufficient documentation

## 2022-11-22 NOTE — Therapy (Signed)
[facility] [address] [place], [place], [address] Phone: [phone]   Fax:  [phone]  Occupational Therapy Treatment  Patient Details  Name: Caitlyn Roth MRN: [id number] Date of Birth: [birth year] Referring Provider (OT): Dr Joice Lofts   Encounter Date: 11/22/2022   OT End of Session - 11/22/22 0854     Visit Number 7    Number of Visits 7    Date for OT Re-Evaluation 11/22/22    OT Start Time 0827    OT Stop Time 0854    OT Time Calculation (min) 27 min    Activity Tolerance Patient tolerated treatment well    Behavior During Therapy [facility] for tasks assessed/performed             Past Medical History:  Diagnosis Date   A-fib [facility])    a.) CHA2DS2VASc = 6 (sex, HTN, CVA/TIA x2,  vascular disease history, T2DM);  b.) rate/rhythm maintained on oral metoprolol succinate; no chronic anticoagulation   Acute ST elevation myocardial infarction (STEMI) of inferior wall (HCC) 01/28/2015   a.) LHC/PCI 01/28/2015 --> 70% pRCA (3.5 x 20 mm Promus Premier)   ADHD (attention deficit hyperactivity disorder)    a.) on amphetamine-dextroamphetamine   Anxiety    Arthritis    Asthma    CAD (coronary artery disease) 01/28/2015   a.) LHC/PCI 01/28/2015 --> 70% pRCA (3.5 x 20 mm Promus Premier DES)   Diabetic polyneuropathy (HCC)    Diastolic dysfunction 01/28/2015   a.) TTE 01/28/2015: EF 50, inf/post HK, mild BAE, triv MR/TR, G1DD   Eczema    Family history of adverse reaction to anesthesia    a.) delayed emergence in 1st degree relative (father)   [name] corneal dystrophy    GERD (gastroesophageal reflux disease)    Graves disease    a.) on methimazole   Hepatic steatosis    Hyperlipemia    Hypertension    Migraines    Right rotator cuff tendinitis    Sleep apnea    Stroke (HCC) 2009   peripheral vision in left eye has been affected,   T2DM (type 2 diabetes mellitus) (HCC)    Vitamin B 12 deficiency     Past Surgical  History:  Procedure Laterality Date   CARPOMETACARPAL (CMC) FUSION OF THUMB Left 08/07/2022   Procedure: LEFT THUMB CMC ARTHROPLASTY;  Surgeon: Christena Flake, MD;  Location: [facility];  Service: Orthopedics;  Laterality: Left;   CESAREAN SECTION     CHOLECYSTECTOMY     CORONARY ANGIOPLASTY WITH STENT PLACEMENT Left 01/28/2015   Procedure: CORONARY ANGIOPLASTY WITH STENT PLACEMENT; Location: [facility]   KNEE ARTHROSCOPY Left    KNEE SURGERY Left 1990   McKay's procedure   SHOULDER ARTHROSCOPY WITH OPEN ROTATOR CUFF REPAIR Right 10/01/2016   Procedure: SHOULDER ARTHROSCOPY WITH OPEN ROTATOR CUFF REPAIR;  Surgeon: Christena Flake, MD;  Location: [facility];  Service: Orthopedics;  Laterality: Right;  Shoulder Block    SINUS SURGERY WITH INSTATRAK     TONSILLECTOMY  1978   TUBAL LIGATION      There were no vitals filed for this visit.   Subjective Assessment - 11/22/22 0828     Subjective  Doing better- using more with less pain and wearing soft splint less than 25% - putty went well    Pertinent History Ortho visit 09/20/22: Assessment: Encounter Diagnoses Name Primary? History of arthroplasty of finger of left hand Yes CMC arthritis Primary osteoarthritis of first carpometacarpal joint  to hold half full glass as well as turn doorknob symptom-free    Status Achieved      OT LONG TERM GOAL #3   Title Left grip and prehension strength increased to WNL range for her age to open packages, do buttons, pull up pants, cook and do laundry symptom-free    Status Achieved                   Plan - 11/22/22 0855     Clinical Impression Statement Patient presented OT evaluation with a diagnosis of left [facility] arthroplasty done on 3/20 /2024 by Dr Joice Lofts.  Pt made great progress and arrive with less pain the last 2 sessions-  Pt show this date less pain with thumb PA and RA and lat pinch. Pt show increase prehension with less pain - upgrade pt to med putty HEP  cont with twisting towards her for thumb PA and pulling apart keep opposition and CMC out of CT. Pain free - decreasing reps and sets - can cont with this as needed for HEP 3 x wk.  Pt also to cont with joint protection principles.  Weaning gradually out of CMC neoprene. Cont with increase functional strengthening. Pt met all goals and can be discharge    OT Occupational Profile and History Problem Focused Assessment - Including review of records relating to presenting problem    Occupational performance deficits (Please refer to evaluation for details): ADL's;IADL's;Play;Work;Leisure;Social Participation    Body Structure / Function / Physical Skills ADL;Edema;Decreased knowledge of precautions;Flexibility;ROM;Scar mobility;Pain;Strength;IADL    Rehab Potential Good    Clinical Decision Making Limited treatment options, no task modification necessary    Comorbidities Affecting Occupational Performance: None    Modification or Assistance to Complete Evaluation  No modification of tasks or assist necessary to complete eval    OT Frequency 1x / week    OT Duration  --   1 wk   OT Treatment/Interventions Self-care/ADL training;Contrast Bath;Fluidtherapy;Paraffin;Therapeutic exercise;DME and/or AE instruction;Manual Therapy;Scar mobilization;Splinting;Patient/family education;Passive range of motion    Consulted and Agree with Plan of Care Patient             Patient will benefit from skilled therapeutic intervention in order to improve the following deficits and impairments:   Body Structure / Function / Physical Skills: ADL, Edema, Decreased knowledge of precautions, Flexibility, ROM, Scar mobility, Pain, Strength, IADL       Visit Diagnosis: Pain in left hand  Scar tissue  Muscle weakness (generalized)  Stiffness of left hand, not elsewhere classified    Problem List Patient Active Problem List   Diagnosis Date Noted   Mixed hyperlipidemia 01/29/2017   Vitamin D deficiency 01/29/2017   Biceps tendinitis of right upper extremity 08/02/2016   Complete tear of right rotator cuff 08/02/2016   Arteriosclerotic coronary artery disease 02/07/2015   Cerebral infarction (HCC) 11/03/2014   Eczema 11/03/2014   Hypertension 11/03/2014   Hyperthyroidism 11/03/2014   Migraine 11/03/2014   Hypercholesterolemia 11/11/2007   Allergic rhinitis 08/12/2007   Barrett's esophagus without dysplasia 08/12/2007   Gastro-esophageal reflux disease without esophagitis 08/12/2007    [name], OTR/L,CLT 11/22/2022, 8:59 AM  [facility] [address], [place], [address] Phone: [phone]   Fax:  [phone]  Name: Caitlyn Roth MRN: [id number] Date of Birth: May 06, 1966  to hold half full glass as well as turn doorknob symptom-free    Status Achieved      OT LONG TERM GOAL #3   Title Left grip and prehension strength increased to WNL range for her age to open packages, do buttons, pull up pants, cook and do laundry symptom-free    Status Achieved                   Plan - 11/22/22 0855     Clinical Impression Statement Patient presented OT evaluation with a diagnosis of left [facility] arthroplasty done on 3/20 /2024 by Dr Joice Lofts.  Pt made great progress and arrive with less pain the last 2 sessions-  Pt show this date less pain with thumb PA and RA and lat pinch. Pt show increase prehension with less pain - upgrade pt to med putty HEP  cont with twisting towards her for thumb PA and pulling apart keep opposition and CMC out of CT. Pain free - decreasing reps and sets - can cont with this as needed for HEP 3 x wk.  Pt also to cont with joint protection principles.  Weaning gradually out of CMC neoprene. Cont with increase functional strengthening. Pt met all goals and can be discharge    OT Occupational Profile and History Problem Focused Assessment - Including review of records relating to presenting problem    Occupational performance deficits (Please refer to evaluation for details): ADL's;IADL's;Play;Work;Leisure;Social Participation    Body Structure / Function / Physical Skills ADL;Edema;Decreased knowledge of precautions;Flexibility;ROM;Scar mobility;Pain;Strength;IADL    Rehab Potential Good    Clinical Decision Making Limited treatment options, no task modification necessary    Comorbidities Affecting Occupational Performance: None    Modification or Assistance to Complete Evaluation  No modification of tasks or assist necessary to complete eval    OT Frequency 1x / week    OT Duration  --   1 wk   OT Treatment/Interventions Self-care/ADL training;Contrast Bath;Fluidtherapy;Paraffin;Therapeutic exercise;DME and/or AE instruction;Manual Therapy;Scar mobilization;Splinting;Patient/family education;Passive range of motion    Consulted and Agree with Plan of Care Patient             Patient will benefit from skilled therapeutic intervention in order to improve the following deficits and impairments:   Body Structure / Function / Physical Skills: ADL, Edema, Decreased knowledge of precautions, Flexibility, ROM, Scar mobility, Pain, Strength, IADL       Visit Diagnosis: Pain in left hand  Scar tissue  Muscle weakness (generalized)  Stiffness of left hand, not elsewhere classified    Problem List Patient Active Problem List   Diagnosis Date Noted   Mixed hyperlipidemia 01/29/2017   Vitamin D deficiency 01/29/2017   Biceps tendinitis of right upper extremity 08/02/2016   Complete tear of right rotator cuff 08/02/2016   Arteriosclerotic coronary artery disease 02/07/2015   Cerebral infarction (HCC) 11/03/2014   Eczema 11/03/2014   Hypertension 11/03/2014   Hyperthyroidism 11/03/2014   Migraine 11/03/2014   Hypercholesterolemia 11/11/2007   Allergic rhinitis 08/12/2007   Barrett's esophagus without dysplasia 08/12/2007   Gastro-esophageal reflux disease without esophagitis 08/12/2007    [name], OTR/L,CLT 11/22/2022, 8:59 AM  [facility] [address], [place], [address] Phone: [phone]   Fax:  [phone]  Name: Caitlyn Roth MRN: [id number] Date of Birth: May 06, 1966

## 2022-12-19 ENCOUNTER — Other Ambulatory Visit: Payer: Self-pay | Admitting: Sports Medicine

## 2022-12-19 DIAGNOSIS — M533 Sacrococcygeal disorders, not elsewhere classified: Secondary | ICD-10-CM

## 2022-12-19 DIAGNOSIS — M7989 Other specified soft tissue disorders: Secondary | ICD-10-CM

## 2022-12-26 ENCOUNTER — Encounter: Payer: Self-pay | Admitting: Emergency Medicine

## 2022-12-26 ENCOUNTER — Other Ambulatory Visit: Payer: Self-pay

## 2022-12-26 ENCOUNTER — Inpatient Hospital Stay
Admission: EM | Admit: 2022-12-26 | Discharge: 2022-12-28 | DRG: 322 | Disposition: A | Discharge status: Home / Self Care | Payer: BC Managed Care – PPO

## 2022-12-26 DIAGNOSIS — F419 Anxiety disorder, unspecified: Secondary | ICD-10-CM | POA: Diagnosis present

## 2022-12-26 DIAGNOSIS — E782 Mixed hyperlipidemia: Secondary | ICD-10-CM | POA: Diagnosis present

## 2022-12-26 DIAGNOSIS — I2111 ST elevation (STEMI) myocardial infarction involving right coronary artery: Secondary | ICD-10-CM | POA: Diagnosis not present

## 2022-12-26 DIAGNOSIS — Z803 Family history of malignant neoplasm of breast: Secondary | ICD-10-CM

## 2022-12-26 DIAGNOSIS — F17210 Nicotine dependence, cigarettes, uncomplicated: Secondary | ICD-10-CM | POA: Diagnosis present

## 2022-12-26 DIAGNOSIS — I2119 ST elevation (STEMI) myocardial infarction involving other coronary artery of inferior wall: Secondary | ICD-10-CM | POA: Diagnosis present

## 2022-12-26 DIAGNOSIS — Z888 Allergy status to other drugs, medicaments and biological substances status: Secondary | ICD-10-CM

## 2022-12-26 DIAGNOSIS — J45909 Unspecified asthma, uncomplicated: Secondary | ICD-10-CM | POA: Diagnosis present

## 2022-12-26 DIAGNOSIS — Z66 Do not resuscitate: Secondary | ICD-10-CM | POA: Diagnosis present

## 2022-12-26 DIAGNOSIS — E1165 Type 2 diabetes mellitus with hyperglycemia: Secondary | ICD-10-CM

## 2022-12-26 DIAGNOSIS — Z7984 Long term (current) use of oral hypoglycemic drugs: Secondary | ICD-10-CM

## 2022-12-26 DIAGNOSIS — Z7982 Long term (current) use of aspirin: Secondary | ICD-10-CM

## 2022-12-26 DIAGNOSIS — I2510 Atherosclerotic heart disease of native coronary artery without angina pectoris: Secondary | ICD-10-CM | POA: Diagnosis present

## 2022-12-26 DIAGNOSIS — E0590 Thyrotoxicosis, unspecified without thyrotoxic crisis or storm: Secondary | ICD-10-CM | POA: Diagnosis present

## 2022-12-26 DIAGNOSIS — Z8673 Personal history of transient ischemic attack (TIA), and cerebral infarction without residual deficits: Secondary | ICD-10-CM

## 2022-12-26 DIAGNOSIS — Z9049 Acquired absence of other specified parts of digestive tract: Secondary | ICD-10-CM

## 2022-12-26 DIAGNOSIS — E1142 Type 2 diabetes mellitus with diabetic polyneuropathy: Secondary | ICD-10-CM | POA: Diagnosis present

## 2022-12-26 DIAGNOSIS — I10 Essential (primary) hypertension: Secondary | ICD-10-CM | POA: Diagnosis present

## 2022-12-26 DIAGNOSIS — Z79899 Other long term (current) drug therapy: Secondary | ICD-10-CM

## 2022-12-26 DIAGNOSIS — K219 Gastro-esophageal reflux disease without esophagitis: Secondary | ICD-10-CM | POA: Insufficient documentation

## 2022-12-26 DIAGNOSIS — E871 Hypo-osmolality and hyponatremia: Secondary | ICD-10-CM

## 2022-12-26 DIAGNOSIS — F32A Depression, unspecified: Secondary | ICD-10-CM | POA: Insufficient documentation

## 2022-12-26 DIAGNOSIS — I4891 Unspecified atrial fibrillation: Secondary | ICD-10-CM | POA: Diagnosis present

## 2022-12-26 DIAGNOSIS — I213 ST elevation (STEMI) myocardial infarction of unspecified site: Principal | ICD-10-CM

## 2022-12-26 DIAGNOSIS — Z881 Allergy status to other antibiotic agents status: Secondary | ICD-10-CM

## 2022-12-26 DIAGNOSIS — E878 Other disorders of electrolyte and fluid balance, not elsewhere classified: Secondary | ICD-10-CM | POA: Diagnosis present

## 2022-12-26 DIAGNOSIS — K760 Fatty (change of) liver, not elsewhere classified: Secondary | ICD-10-CM | POA: Diagnosis present

## 2022-12-26 DIAGNOSIS — Z955 Presence of coronary angioplasty implant and graft: Secondary | ICD-10-CM

## 2022-12-26 DIAGNOSIS — Z885 Allergy status to narcotic agent status: Secondary | ICD-10-CM

## 2022-12-26 MED ORDER — SODIUM CHLORIDE 0.9 % IV SOLN: INTRAVENOUS | Status: DC

## 2022-12-26 MED ORDER — HEPARIN SODIUM (PORCINE) 1000 UNIT/ML IJ SOLN: 4000.0000 [IU] | Freq: Once | INTRAMUSCULAR | Status: DC

## 2022-12-26 MED ORDER — ONDANSETRON HCL 4 MG/2ML IJ SOLN
4.0000 mg | Freq: Once | INTRAMUSCULAR | Status: AC
  Administered 2022-12-27: 4 mg via INTRAVENOUS
  Filled 2022-12-26: qty 2

## 2022-12-26 MED ORDER — MORPHINE SULFATE (PF) 4 MG/ML IV SOLN
4.0000 mg | Freq: Once | INTRAVENOUS | Status: AC
  Administered 2022-12-27: 4 mg via INTRAVENOUS
  Filled 2022-12-26: qty 1

## 2022-12-26 MED ORDER — HEPARIN SODIUM (PORCINE) 5000 UNIT/ML IJ SOLN
4000.0000 [IU] | Freq: Once | INTRAMUSCULAR | Status: AC
  Administered 2022-12-26: 4000 [IU] via INTRAVENOUS

## 2022-12-26 NOTE — ED Provider Notes (Signed)
[facility] Provider Note    Event Date/Time   First MD Initiated Contact with Patient 12/26/22 2355     (approximate)   History   Code STEMI   HPI  Caitlyn Roth is a 57 y.o. female brought to the ED via EMS from home with a chief complaint of central chest pain which began around 6 PM associated with dizziness and shortness of breath.  History of CAD status post stents in 2017.  Given 324 mg baby aspirin and 1 spray of nitroglycerin which brought pain down from 12/10 to 6/10.  Endorses recent fatigue.  Denies fever/chills, cough, abdominal pain, nausea, vomiting or diarrhea.     Past Medical History   Past Medical History:  Diagnosis Date   A-fib [facility])    a.) CHA2DS2VASc = 6 (sex, HTN, CVA/TIA x2,  vascular disease history, T2DM);  b.) rate/rhythm maintained on oral metoprolol succinate; no chronic anticoagulation   Acute ST elevation myocardial infarction (STEMI) of inferior wall (HCC) 01/28/2015   a.) LHC/PCI 01/28/2015 --> 70% pRCA (3.5 x 20 mm Promus Premier)   ADHD (attention deficit hyperactivity disorder)    a.) on amphetamine-dextroamphetamine   Anxiety    Arthritis    Asthma    CAD (coronary artery disease) 01/28/2015   a.) LHC/PCI 01/28/2015 --> 70% pRCA (3.5 x 20 mm Promus Premier DES)   Diabetic polyneuropathy (HCC)    Diastolic dysfunction 01/28/2015   a.) TTE 01/28/2015: EF 50, inf/post HK, mild BAE, triv MR/TR, G1DD   Eczema    Family history of adverse reaction to anesthesia    a.) delayed emergence in 1st degree relative (father)   [name] corneal dystrophy    GERD (gastroesophageal reflux disease)    Graves disease    a.) on methimazole   Hepatic steatosis    Hyperlipemia    Hypertension    Migraines    Right rotator cuff tendinitis    Sleep apnea    Stroke (HCC) 2009   peripheral vision in left eye has been affected,   T2DM (type 2 diabetes mellitus) (HCC)    Vitamin B 12 deficiency      Active Problem List    Patient Active Problem List   Diagnosis Date Noted   STEMI involving right coronary artery (HCC) 12/27/2022   Acute ST elevation myocardial infarction (STEMI) of inferior wall (HCC) 12/27/2022   Uncontrolled type 2 diabetes mellitus with hyperglycemia, without long-term current use of insulin (HCC) 12/27/2022   Anxiety and depression 12/27/2022   GERD without esophagitis 12/27/2022   Hyponatremia 12/27/2022   Mixed hyperlipidemia 01/29/2017   Vitamin D deficiency 01/29/2017   Biceps tendinitis of right upper extremity 08/02/2016   Complete tear of right rotator cuff 08/02/2016   Arteriosclerotic coronary artery disease 02/07/2015   Cerebral infarction (HCC) 11/03/2014   Eczema 11/03/2014   Hypertension 11/03/2014   Hyperthyroidism 11/03/2014   Migraine 11/03/2014   Hypercholesterolemia 11/11/2007   Allergic rhinitis 08/12/2007   Barrett's esophagus without dysplasia 08/12/2007   Gastro-esophageal reflux disease without esophagitis 08/12/2007     Past Surgical History   Past Surgical History:  Procedure Laterality Date   CARPOMETACARPAL (CMC) FUSION OF THUMB Left 08/07/2022   Procedure: LEFT THUMB CMC ARTHROPLASTY;  Surgeon: Christena Flake, MD;  Location: [facility] ORS;  Service: Orthopedics;  Laterality: Left;   CESAREAN SECTION     CHOLECYSTECTOMY     CORONARY ANGIOPLASTY WITH STENT PLACEMENT Left 01/28/2015   Procedure: CORONARY ANGIOPLASTY WITH STENT PLACEMENT;  Location: [facility]   KNEE ARTHROSCOPY Left    KNEE SURGERY Left 1990   McKay's procedure   SHOULDER ARTHROSCOPY WITH OPEN ROTATOR CUFF REPAIR Right 10/01/2016   Procedure: SHOULDER ARTHROSCOPY WITH OPEN ROTATOR CUFF REPAIR;  Surgeon: Christena Flake, MD;  Location: [facility] ORS;  Service: Orthopedics;  Laterality: Right;  Shoulder Block    SINUS SURGERY WITH INSTATRAK     TONSILLECTOMY  1978   TUBAL LIGATION       Home Medications   Prior to Admission medications   Medication Sig Start Date End Date Taking? Authorizing  Provider  albuterol (VENTOLIN HFA) 108 (90 Base) MCG/ACT inhaler Inhale 2 puffs into the lungs every 6 (six) hours as needed for wheezing or shortness of breath.    [provider]  amphetamine-dextroamphetamine (ADDERALL XR) 30 MG 24 hr capsule Take 30 mg by mouth daily. 02/19/14   [provider]  aspirin EC 81 MG tablet Take 81 mg by mouth daily. Swallow whole.    [provider]  brimonidine (ALPHAGAN) 0.2 % ophthalmic solution Place 1 drop into the right eye 3 (three) times daily.    [provider]  busPIRone (BUSPAR) 10 MG tablet Take 10 mg by mouth 3 (three) times daily.    [provider]  Cyanocobalamin (B-12) 1000 MCG/ML KIT Inject 1 mL as directed once a week.    [provider]  desloratadine (CLARINEX) 5 MG tablet Take 5 mg by mouth daily.     [provider]  empagliflozin (JARDIANCE) 25 MG TABS tablet Take 25 mg by mouth daily.    [provider]  gabapentin (NEURONTIN) 300 MG capsule Take 600 mg by mouth at bedtime.    [provider]  lisinopril (PRINIVIL,ZESTRIL) 2.5 MG tablet Take 1 tablet by mouth daily. 04/22/15   [provider]  loteprednol (LOTEMAX) 0.5 % ophthalmic suspension Place 1 drop into the right eye daily.    [provider]  methimazole (TAPAZOLE) 5 MG tablet Take 5 mg by mouth at bedtime.    [provider]  metoprolol succinate (TOPROL-XL) 50 MG 24 hr tablet Take 50 mg by mouth at bedtime. Take with or immediately following a meal.    [provider]  montelukast (SINGULAIR) 10 MG tablet Take 10 mg by mouth at bedtime.    [provider]  pantoprazole (PROTONIX) 40 MG tablet Take 40 mg by mouth at bedtime.    [provider]  rosuvastatin (CRESTOR) 10 MG tablet Take 10 mg by mouth at bedtime.    [provider]  timolol (BETIMOL) 0.5 % ophthalmic solution Place 1 drop into the right eye 2 (two) times daily.    [provider]  venlafaxine XR (EFFEXOR-XR) 75 MG 24 hr capsule Take 225 mg by mouth daily with breakfast.    [provider]     Allergies  Cefdinir, Milk (cow), Prednisone, Acyclovir, Codeine, and Other   Family History   Family History  Problem Relation Age of Onset   Breast cancer Maternal Aunt 60     Physical Exam  Triage Vital Signs: ED Triage Vitals  Encounter Vitals Group     BP --      Systolic BP Percentile --      Diastolic BP Percentile --      Pulse --      Resp --      Temp --      Temp src --  SpO2 12/26/22 2350 96 %     Weight 12/26/22 2356 163 lb (73.9 kg)     Height 12/26/22 2356 5\' 9"  (1.753 m)     Head Circumference --      Peak Flow --      Pain Score 12/26/22 2352 8     Pain Loc --      Pain Education --      Exclude from Growth Chart --     Updated Vital Signs: BP 134/78   Pulse 63   Temp 97.9 F (36.6 C) (Oral)   Resp 13   Ht 5\' 9"  (1.753 m)   Wt 74.1 kg   LMP 09/21/2014   SpO2 99%   BMI 24.12 kg/m    General: Awake, mild distress.  CV:  RRR.  Good peripheral perfusion.  Resp:  Normal effort.  CTAB. Abd:  Nontender.  No distention.  Other:  Bilateral calves are supple and nontender.   ED Results / Procedures / Treatments  Labs (all labs ordered are listed, but only abnormal results are displayed) Labs Reviewed  CBC WITH DIFFERENTIAL/PLATELET - Abnormal; Notable for the following components:      Result Value   WBC 10.7 (*)    All other components within normal limits  COMPREHENSIVE METABOLIC PANEL - Abnormal; Notable for the following components:   Sodium 131 (*)    Chloride 95 (*)    Glucose, Bld 531 (*)    All other components within normal limits  LIPID PANEL - Abnormal; Notable for the following components:   Cholesterol 238 (*)    Triglycerides 388 (*)    HDL 27 (*)    VLDL 78 (*)    LDL Cholesterol 133 (*)    All other components within normal limits  GLUCOSE, CAPILLARY - Abnormal; Notable for  the following components:   Glucose-Capillary 434 (*)    All other components within normal limits  BASIC METABOLIC PANEL - Abnormal; Notable for the following components:   Sodium 129 (*)    Chloride 96 (*)    Glucose, Bld 415 (*)    All other components within normal limits  GLUCOSE, CAPILLARY - Abnormal; Notable for the following components:   Glucose-Capillary 289 (*)    All other components within normal limits  TROPONIN I (HIGH SENSITIVITY) - Abnormal; Notable for the following components:   Troponin I (High Sensitivity) 26 (*)    All other components within normal limits  TROPONIN I (HIGH SENSITIVITY) - Abnormal; Notable for the following components:   Troponin I (High Sensitivity) 574 (*)    All other components within normal limits  TROPONIN I (HIGH SENSITIVITY) - Abnormal; Notable for the following components:   Troponin I (High Sensitivity) 1,443 (*)    All other components within normal limits  MRSA NEXT GEN BY PCR, NASAL  PROTIME-INR  APTT  CBC  HEMOGLOBIN A1C  HIV ANTIBODY (ROUTINE TESTING W REFLEX)  LIPOPROTEIN A (LPA)  POCT ACTIVATED CLOTTING TIME     EKG  ED ECG REPORT I, Barrington Worley J, the attending physician, personally viewed and interpreted this ECG.   Date: 12/26/2022  EKG Time: 2351  Rate: 66  Rhythm: normal sinus rhythm  Axis: Normal  Intervals:none  ST&T Change: Inferior STEMI    RADIOLOGY I have independently visualized and interpreted patient's x-ray as well as noted the radiology interpretation:  Chest x-ray: No acute cardiopulmonary process  Official radiology report(s): CARDIAC CATHETERIZATION  Result Date: 12/27/2022   Mid RCA lesion  is 99% stenosed.   A drug-eluting stent was successfully placed using a STENT ONYX FRONTIER 3.0X22.   Post intervention, there is a 0% residual stenosis.   There is mild left ventricular systolic dysfunction.   LV end diastolic pressure is normal.   The left ventricular ejection fraction is 45-50% by  visual estimate.   As long as the patient continues to meet low risk STEMI criteria and in the absence of any other complications or medical issues, we expect the patient to be ready for discharge on 12/29/2022.   Recommend uninterrupted dual antiplatelet therapy with Aspirin 81mg  daily and Ticagrelor 90mg  twice daily for a minimum of 12 months (ACS-Class I recommendation). Conclusion STEMI presentation Diagnostic Left system relatively free of disease Large left main no significant disease Large LAD with a distal 75% lesion Circumflex large minor irregularities RCA large proximal previous stent widely patent mid RCA 99% thrombotic lesion IRA TIMI II flow right dominant system Intervention PCI and stent DES to mid RCA overlapping the distal part of the previous stent 3.0 x 22 mm frontier Onyx to 18 atm lesion reduced from 99 down to 0 TIMI-3 flow restored Mynx deployed right femoral artery Patient maintained on aspirin Brilinta for at least 12 months Angiomax at reduced rate for an additional 2 hours Patient transferred to ICU hospitalist contacted for medical management Anticipate discharge 48 to 72 hours   DG Chest Port 1 View  Result Date: 12/27/2022 CLINICAL DATA:  STEMI EXAM: PORTABLE CHEST 1 VIEW COMPARISON:  02/19/2019 FINDINGS: The heart size and mediastinal contours are within normal limits. Both lungs are clear. The visualized skeletal structures are unremarkable. IMPRESSION: No active disease. Electronically Signed   By: Charlett Nose M.D.   On: 12/27/2022 00:11     PROCEDURES:  Critical Care performed: Yes, see critical care procedure note(s)  CRITICAL CARE Performed by: Irean Hong   Total critical care time: 20 minutes  Critical care time was exclusive of separately billable procedures and treating other patients.  Critical care was necessary to treat or prevent imminent or life-threatening deterioration.  Critical care was time spent personally by me on the following activities:  development of treatment plan with patient and/or surrogate as well as nursing, discussions with consultants, evaluation of patient's response to treatment, examination of patient, obtaining history from patient or surrogate, ordering and performing treatments and interventions, ordering and review of laboratory studies, ordering and review of radiographic studies, pulse oximetry and re-evaluation of patient's condition.   Marland Kitchen1-3 Lead EKG Interpretation  Performed by: Irean Hong, MD Authorized by: Irean Hong, MD     Interpretation: normal     ECG rate:  66   ECG rate assessment: normal     Rhythm: sinus rhythm     Ectopy: none     Conduction: normal   Comments:     Patient placed on cardiac monitor to evaluate for arrhythmias    MEDICATIONS ORDERED IN ED: Medications  0.9 %  sodium chloride infusion (0 mLs Intravenous Stopped 12/27/22 0300)  labetalol (NORMODYNE) injection 10 mg (has no administration in time range)  hydrALAZINE (APRESOLINE) injection 10 mg (has no administration in time range)  0.9% sodium chloride infusion (0 mL/kg/hr  73.9 kg Intravenous Stopped 12/27/22 0300)  sodium chloride flush (NS) 0.9 % injection 3 mL (has no administration in time range)  sodium chloride flush (NS) 0.9 % injection 3 mL (has no administration in time range)  0.9 %  sodium chloride infusion (has no  administration in time range)  aspirin chewable tablet 81 mg (has no administration in time range)  ticagrelor (BRILINTA) tablet 90 mg (has no administration in time range)  bivalirudin (ANGIOMAX) 250 mg in sodium chloride 0.9 % 50 mL (5 mg/mL) infusion (0 mg/kg/hr  73.9 kg Intravenous Stopped 12/27/22 0315)  metoprolol succinate (TOPROL-XL) 24 hr tablet 50 mg (has no administration in time range)  lisinopril (ZESTRIL) tablet 5 mg (has no administration in time range)  rosuvastatin (CRESTOR) tablet 20 mg (has no administration in time range)  pantoprazole (PROTONIX) EC tablet 40 mg (has no  administration in time range)  busPIRone (BUSPAR) tablet 10 mg (has no administration in time range)  venlafaxine XR (EFFEXOR-XR) 24 hr capsule 225 mg (has no administration in time range)  methimazole (TAPAZOLE) tablet 5 mg (has no administration in time range)  gabapentin (NEURONTIN) capsule 600 mg (has no administration in time range)  cyanocobalamin (VITAMIN B12) injection 1,000 mcg (has no administration in time range)  montelukast (SINGULAIR) tablet 10 mg (has no administration in time range)  brimonidine (ALPHAGAN) 0.2 % ophthalmic solution 1 drop (has no administration in time range)  loteprednol (LOTEMAX) 0.5 % ophthalmic suspension 1 drop (has no administration in time range)  timolol (TIMOPTIC) 0.5 % ophthalmic solution 1 drop (has no administration in time range)  enoxaparin (LOVENOX) injection 40 mg (has no administration in time range)  0.9 %  sodium chloride infusion ( Intravenous New Bag/Given 12/27/22 0233)  acetaminophen (TYLENOL) tablet 650 mg (has no administration in time range)    Or  acetaminophen (TYLENOL) suppository 650 mg (has no administration in time range)  traZODone (DESYREL) tablet 25 mg (has no administration in time range)  magnesium hydroxide (MILK OF MAGNESIA) suspension 30 mL (has no administration in time range)  ondansetron (ZOFRAN) tablet 4 mg (has no administration in time range)    Or  ondansetron (ZOFRAN) injection 4 mg (has no administration in time range)  albuterol (PROVENTIL) (2.5 MG/3ML) 0.083% nebulizer solution 2.5 mg (has no administration in time range)  insulin aspart (novoLOG) injection 0-20 Units (20 Units Subcutaneous Given 12/27/22 0238)  Oral care mouth rinse (has no administration in time range)  morphine (PF) 4 MG/ML injection 4 mg (4 mg Intravenous Given 12/27/22 0000)  ondansetron (ZOFRAN) injection 4 mg (4 mg Intravenous Given 12/27/22 0001)  heparin injection 4,000 Units (4,000 Units Intravenous Given 12/26/22 2357)  ticagrelor  (BRILINTA) tablet 180 mg (180 mg Oral Given 12/27/22 0011)  nitroGLYCERIN (NITROGLYN) 2 % ointment 1 inch (1 inch Topical Given 12/27/22 0010)  bivalirudin (ANGIOMAX) 250 mg in sodium chloride 0.9 % 50 mL (5 mg/mL) infusion (0 mg/kg/hr  73.9 kg  Stopped 12/27/22 0106)  sodium chloride 0.9 % bolus 1,000 mL (0 mLs Intravenous Stopped 12/27/22 0330)     IMPRESSION / MDM / ASSESSMENT AND PLAN / ED COURSE  I reviewed the triage vital signs and the nursing notes.                             57 year old female presenting with chest pain. Differential diagnosis includes, but is not limited to, ACS, aortic dissection, pulmonary embolism, cardiac tamponade, pneumothorax, pneumonia, pericarditis, myocarditis, GI-related causes including esophagitis/gastritis, and musculoskeletal chest wall pain.   I personally reviewed patient's records and note a telephone communication from PCP yesterday regarding elevated hemoglobin A1c 13.8.  Start Metformin 500 mg daily and refer to endocrinology.  Patient's presentation is most consistent with  acute presentation with potential threat to life or bodily function.  The patient is on the cardiac monitor to evaluate for evidence of arrhythmia and/or significant heart rate changes.  EMS activated code STEMI in the field.  Administer heparin bolus, Morphine/Zofran.  Awaiting cardiology.  Laboratory results pending.  Clinical Course as of 12/27/22 0433  Fri Dec 27, 2022  0010 Added Brilinta per cardiology recommendations. [JS]    Clinical Course User Index [JS] Irean Hong, MD     FINAL CLINICAL IMPRESSION(S) / ED DIAGNOSES   Final diagnoses:  ST elevation myocardial infarction (STEMI), unspecified artery [facility])     Rx / DC Orders   ED Discharge Orders          Ordered    AMB Referral to Cardiac Rehabilitation - Phase II        12/27/22 0118             Note:  This document was prepared using Dragon voice recognition software and may include  unintentional dictation errors.   Irean Hong, MD 12/27/22 [phone]

## 2022-12-26 NOTE — ED Triage Notes (Addendum)
Pt arrived via [facility] from home with reports of central chest pain that started about 6 hours ago, pt also c/o dizziness and shortness of breath, pt placed on 4L [place] for comfort.  Pt recently dx with diabetes, was seen by PCP today.   Give 324 ASA with EMS and 1 spray NTG.

## 2022-12-26 NOTE — Progress Notes (Signed)
Paged bedside for code STEMI. Pt found alert and w/o distress on RA, sp02 96%. Nursing bedside for pt care w/ EMS.

## 2022-12-27 ENCOUNTER — Inpatient Hospital Stay
Admit: 2022-12-27 | Discharge: 2022-12-27 | Disposition: A | Discharge status: Home / Self Care | Payer: BC Managed Care – PPO | Attending: Internal Medicine | Admitting: Internal Medicine

## 2022-12-27 ENCOUNTER — Encounter: Admission: EM | Disposition: A | Discharge status: Home / Self Care | Payer: Self-pay | Source: Home / Self Care

## 2022-12-27 ENCOUNTER — Inpatient Hospital Stay: Payer: BC Managed Care – PPO | Admitting: Certified Registered Nurse Anesthetist

## 2022-12-27 ENCOUNTER — Other Ambulatory Visit (HOSPITAL_COMMUNITY): Payer: Self-pay

## 2022-12-27 ENCOUNTER — Other Ambulatory Visit: Payer: Self-pay

## 2022-12-27 ENCOUNTER — Other Ambulatory Visit: Payer: BC Managed Care – PPO

## 2022-12-27 ENCOUNTER — Emergency Department: Payer: BC Managed Care – PPO

## 2022-12-27 ENCOUNTER — Telehealth (HOSPITAL_COMMUNITY): Payer: Self-pay | Admitting: Pharmacy Technician

## 2022-12-27 DIAGNOSIS — F419 Anxiety disorder, unspecified: Secondary | ICD-10-CM | POA: Diagnosis present

## 2022-12-27 DIAGNOSIS — E0590 Thyrotoxicosis, unspecified without thyrotoxic crisis or storm: Secondary | ICD-10-CM

## 2022-12-27 DIAGNOSIS — F32A Depression, unspecified: Secondary | ICD-10-CM | POA: Diagnosis present

## 2022-12-27 DIAGNOSIS — K760 Fatty (change of) liver, not elsewhere classified: Secondary | ICD-10-CM | POA: Diagnosis present

## 2022-12-27 DIAGNOSIS — E782 Mixed hyperlipidemia: Secondary | ICD-10-CM | POA: Diagnosis present

## 2022-12-27 DIAGNOSIS — Z79899 Other long term (current) drug therapy: Secondary | ICD-10-CM | POA: Diagnosis not present

## 2022-12-27 DIAGNOSIS — K219 Gastro-esophageal reflux disease without esophagitis: Secondary | ICD-10-CM | POA: Diagnosis present

## 2022-12-27 DIAGNOSIS — I2111 ST elevation (STEMI) myocardial infarction involving right coronary artery: Secondary | ICD-10-CM | POA: Diagnosis not present

## 2022-12-27 DIAGNOSIS — I2119 ST elevation (STEMI) myocardial infarction involving other coronary artery of inferior wall: Secondary | ICD-10-CM | POA: Diagnosis present

## 2022-12-27 DIAGNOSIS — I10 Essential (primary) hypertension: Secondary | ICD-10-CM | POA: Diagnosis present

## 2022-12-27 DIAGNOSIS — Z9049 Acquired absence of other specified parts of digestive tract: Secondary | ICD-10-CM | POA: Diagnosis not present

## 2022-12-27 DIAGNOSIS — Z8673 Personal history of transient ischemic attack (TIA), and cerebral infarction without residual deficits: Secondary | ICD-10-CM | POA: Diagnosis not present

## 2022-12-27 DIAGNOSIS — E878 Other disorders of electrolyte and fluid balance, not elsewhere classified: Secondary | ICD-10-CM | POA: Diagnosis present

## 2022-12-27 DIAGNOSIS — E1165 Type 2 diabetes mellitus with hyperglycemia: Secondary | ICD-10-CM

## 2022-12-27 DIAGNOSIS — Z803 Family history of malignant neoplasm of breast: Secondary | ICD-10-CM | POA: Diagnosis not present

## 2022-12-27 DIAGNOSIS — E1142 Type 2 diabetes mellitus with diabetic polyneuropathy: Secondary | ICD-10-CM | POA: Diagnosis present

## 2022-12-27 DIAGNOSIS — E871 Hypo-osmolality and hyponatremia: Secondary | ICD-10-CM | POA: Diagnosis present

## 2022-12-27 DIAGNOSIS — I2510 Atherosclerotic heart disease of native coronary artery without angina pectoris: Secondary | ICD-10-CM | POA: Diagnosis present

## 2022-12-27 DIAGNOSIS — I213 ST elevation (STEMI) myocardial infarction of unspecified site: Secondary | ICD-10-CM | POA: Diagnosis present

## 2022-12-27 DIAGNOSIS — I4891 Unspecified atrial fibrillation: Secondary | ICD-10-CM | POA: Diagnosis present

## 2022-12-27 DIAGNOSIS — Z955 Presence of coronary angioplasty implant and graft: Secondary | ICD-10-CM | POA: Diagnosis not present

## 2022-12-27 DIAGNOSIS — Z66 Do not resuscitate: Secondary | ICD-10-CM | POA: Diagnosis present

## 2022-12-27 DIAGNOSIS — F17210 Nicotine dependence, cigarettes, uncomplicated: Secondary | ICD-10-CM | POA: Diagnosis present

## 2022-12-27 DIAGNOSIS — Z7984 Long term (current) use of oral hypoglycemic drugs: Secondary | ICD-10-CM | POA: Diagnosis not present

## 2022-12-27 DIAGNOSIS — J45909 Unspecified asthma, uncomplicated: Secondary | ICD-10-CM | POA: Diagnosis present

## 2022-12-27 DIAGNOSIS — Z7982 Long term (current) use of aspirin: Secondary | ICD-10-CM | POA: Diagnosis not present

## 2022-12-27 HISTORY — PX: CORONARY/GRAFT ACUTE MI REVASCULARIZATION: CATH118305

## 2022-12-27 HISTORY — PX: LEFT HEART CATH AND CORONARY ANGIOGRAPHY: CATH118249

## 2022-12-27 LAB — GLUCOSE, CAPILLARY
Glucose-Capillary: 122 mg/dL — ABNORMAL HIGH (ref 70–99)
Glucose-Capillary: 152 mg/dL — ABNORMAL HIGH (ref 70–99)
Glucose-Capillary: 167 mg/dL — ABNORMAL HIGH (ref 70–99)
Glucose-Capillary: 189 mg/dL — ABNORMAL HIGH (ref 70–99)
Glucose-Capillary: 224 mg/dL — ABNORMAL HIGH (ref 70–99)
Glucose-Capillary: 246 mg/dL — ABNORMAL HIGH (ref 70–99)
Glucose-Capillary: 289 mg/dL — ABNORMAL HIGH (ref 70–99)
Glucose-Capillary: 434 mg/dL — ABNORMAL HIGH (ref 70–99)

## 2022-12-27 LAB — ECHOCARDIOGRAM COMPLETE
AR max vel: 2.13 cm2
AV Area VTI: 2.29 cm2
AV Area mean vel: 1.93 cm2
AV Mean grad: 5 mmHg
AV Peak grad: 8.9 mmHg
Ao pk vel: 1.49 m/s
Area-P 1/2: 3.33 cm2
Calc EF: 42.9 %
Height: 69 in
MV VTI: 1.89 cm2
S' Lateral: 3.1 cm
Single Plane A2C EF: 32.4 %
Single Plane A4C EF: 50.7 %
Weight: 2613.77 oz

## 2022-12-27 LAB — POCT ACTIVATED CLOTTING TIME: Activated Clotting Time: 372 seconds

## 2022-12-27 LAB — BASIC METABOLIC PANEL
Anion gap: 9 (ref 5–15)
Anion gap: 8 (ref 5–15)
BUN: 11 mg/dL (ref 6–20)
BUN: 9 mg/dL (ref 6–20)
CO2: 24 mmol/L (ref 22–32)
CO2: 23 mmol/L (ref 22–32)
Calcium: 8.9 mg/dL (ref 8.9–10.3)
Calcium: 8.7 mg/dL — ABNORMAL LOW (ref 8.9–10.3)
Chloride: 96 mmol/L — ABNORMAL LOW (ref 98–111)
Chloride: 102 mmol/L (ref 98–111)
Creatinine, Ser: 0.61 mg/dL (ref 0.44–1.00)
Creatinine, Ser: 0.62 mg/dL (ref 0.44–1.00)
GFR, Estimated: >60 mL/min (ref >60)
GFR, Estimated: >60 mL/min (ref >60)
Glucose, Bld: 415 mg/dL — ABNORMAL HIGH (ref 70–99)
Glucose, Bld: 214 mg/dL — ABNORMAL HIGH (ref 70–99)
Potassium: 4 mmol/L (ref 3.5–5.1)
Potassium: 3.5 mmol/L (ref 3.5–5.1)
Sodium: 129 mmol/L — ABNORMAL LOW (ref 135–145)
Sodium: 133 mmol/L — ABNORMAL LOW (ref 135–145)

## 2022-12-27 LAB — CBC
HCT: 37.5 % (ref 36.0–46.0)
HCT: 34.9 % — ABNORMAL LOW (ref 36.0–46.0)
Hemoglobin: 13.1 g/dL (ref 12.0–15.0)
Hemoglobin: 12.5 g/dL (ref 12.0–15.0)
MCH: 30.9 pg (ref 26.0–34.0)
MCH: 31.1 pg (ref 26.0–34.0)
MCHC: 34.9 g/dL (ref 30.0–36.0)
MCHC: 35.8 g/dL (ref 30.0–36.0)
MCV: 88.4 fL (ref 80.0–100.0)
MCV: 86.8 fL (ref 80.0–100.0)
Platelets: 217 10*3/uL (ref 150–400)
Platelets: 223 10*3/uL (ref 150–400)
RBC: 4.24 MIL/uL (ref 3.87–5.11)
RBC: 4.02 MIL/uL (ref 3.87–5.11)
RDW: 12.3 % (ref 11.5–15.5)
RDW: 12.4 % (ref 11.5–15.5)
WBC: 8.6 10*3/uL (ref 4.0–10.5)
WBC: 7.5 10*3/uL (ref 4.0–10.5)
nRBC: 0 % (ref 0.0–0.2)
nRBC: 0 % (ref 0.0–0.2)

## 2022-12-27 LAB — HEMOGLOBIN A1C
Hgb A1c MFr Bld: 13.6 % — ABNORMAL HIGH (ref 4.8–5.6)
Mean Plasma Glucose: 343.62 mg/dL

## 2022-12-27 LAB — TROPONIN I (HIGH SENSITIVITY)
Troponin I (High Sensitivity): 574 ng/L (ref <18)
Troponin I (High Sensitivity): 1443 ng/L (ref <18)
Troponin I (High Sensitivity): 3939 ng/L (ref <18)

## 2022-12-27 LAB — HIV ANTIBODY (ROUTINE TESTING W REFLEX): HIV Screen 4th Generation wRfx: NONREACTIVE

## 2022-12-27 LAB — MRSA NEXT GEN BY PCR, NASAL: MRSA by PCR Next Gen: NOT DETECTED

## 2022-12-27 SURGERY — CORONARY/GRAFT ACUTE MI REVASCULARIZATION
Anesthesia: Moderate Sedation

## 2022-12-27 MED ORDER — INSULIN ASPART 100 UNIT/ML IJ SOLN
0.0000 [IU] | INTRAMUSCULAR | Status: DC
  Administered 2022-12-27: 4 [IU] via SUBCUTANEOUS
  Administered 2022-12-27: 7 [IU] via SUBCUTANEOUS
  Administered 2022-12-27: 20 [IU] via SUBCUTANEOUS
  Administered 2022-12-27: 4 [IU] via SUBCUTANEOUS
  Administered 2022-12-27: 3 [IU] via SUBCUTANEOUS
  Administered 2022-12-27: 4 [IU] via SUBCUTANEOUS
  Administered 2022-12-28: 3 [IU] via SUBCUTANEOUS
  Administered 2022-12-28: 7 [IU] via SUBCUTANEOUS
  Filled 2022-12-27 (×8): qty 1

## 2022-12-27 MED ORDER — ONDANSETRON HCL 4 MG/2ML IJ SOLN
4.0000 mg | Freq: Four times a day (QID) | INTRAMUSCULAR | Status: DC | PRN
  Administered 2022-12-27: 4 mg via INTRAVENOUS
  Filled 2022-12-27: qty 2

## 2022-12-27 MED ORDER — LIDOCAINE HCL 1 % IJ SOLN
INTRAMUSCULAR | Status: AC
  Filled 2022-12-27: qty 20

## 2022-12-27 MED ORDER — ASPIRIN 81 MG PO CHEW
81.0000 mg | CHEWABLE_TABLET | Freq: Every day | ORAL | Status: DC
  Administered 2022-12-28: 81 mg via ORAL
  Filled 2022-12-27: qty 1

## 2022-12-27 MED ORDER — HYDRALAZINE HCL 20 MG/ML IJ SOLN: 10.0000 mg | INTRAMUSCULAR | Status: AC | PRN

## 2022-12-27 MED ORDER — ASPIRIN 81 MG PO TBEC: 81.0000 mg | DELAYED_RELEASE_TABLET | Freq: Every day | ORAL | Status: DC

## 2022-12-27 MED ORDER — MIDAZOLAM HCL 2 MG/2ML IJ SOLN
INTRAMUSCULAR | Status: AC
  Filled 2022-12-27: qty 2

## 2022-12-27 MED ORDER — SODIUM CHLORIDE 0.9 % IV BOLUS
1000.0000 mL | Freq: Once | INTRAVENOUS | Status: AC
  Administered 2022-12-27: 1000 mL via INTRAVENOUS

## 2022-12-27 MED ORDER — ONDANSETRON HCL 4 MG PO TABS: 4.0000 mg | ORAL_TABLET | Freq: Four times a day (QID) | ORAL | Status: DC | PRN

## 2022-12-27 MED ORDER — GABAPENTIN 300 MG PO CAPS: 600.0000 mg | ORAL_CAPSULE | Freq: Every evening | ORAL | Status: DC | PRN

## 2022-12-27 MED ORDER — BUSPIRONE HCL 10 MG PO TABS
10.0000 mg | ORAL_TABLET | Freq: Three times a day (TID) | ORAL | Status: DC
  Administered 2022-12-27: 10 mg via ORAL
  Filled 2022-12-27: qty 1

## 2022-12-27 MED ORDER — FENTANYL CITRATE (PF) 100 MCG/2ML IJ SOLN
INTRAMUSCULAR | Status: AC
  Filled 2022-12-27: qty 2

## 2022-12-27 MED ORDER — BRIMONIDINE TARTRATE 0.2 % OP SOLN
1.0000 [drp] | Freq: Three times a day (TID) | OPHTHALMIC | Status: DC
  Administered 2022-12-27 – 2022-12-28 (×4): 1 [drp] via OPHTHALMIC
  Filled 2022-12-27: qty 5

## 2022-12-27 MED ORDER — SODIUM CHLORIDE 0.9 % IV SOLN
0.2500 mg/kg/h | INTRAVENOUS | Status: AC
  Filled 2022-12-27 (×2): qty 250

## 2022-12-27 MED ORDER — ASPIRIN 81 MG PO TBEC
81.0000 mg | DELAYED_RELEASE_TABLET | Freq: Every day | ORAL | 0 refills | Status: AC
  Filled 2022-12-27: qty 30, 30d supply, fill #0

## 2022-12-27 MED ORDER — LIDOCAINE HCL (PF) 1 % IJ SOLN
INTRAMUSCULAR | Status: DC | PRN
  Administered 2022-12-27: 20 mL

## 2022-12-27 MED ORDER — PANTOPRAZOLE SODIUM 40 MG PO TBEC
40.0000 mg | DELAYED_RELEASE_TABLET | Freq: Every day | ORAL | Status: DC
  Administered 2022-12-27 – 2022-12-28 (×2): 40 mg via ORAL
  Filled 2022-12-27 (×3): qty 1

## 2022-12-27 MED ORDER — METHIMAZOLE 2.5 MG HALF TABLET: 2.5000 mg | ORAL_TABLET | Freq: Every day | ORAL | Status: DC

## 2022-12-27 MED ORDER — HEPARIN (PORCINE) IN NACL 1000-0.9 UT/500ML-% IV SOLN
INTRAVENOUS | Status: AC
  Filled 2022-12-27: qty 1000

## 2022-12-27 MED ORDER — ROSUVASTATIN CALCIUM 10 MG PO TABS
20.0000 mg | ORAL_TABLET | Freq: Every day | ORAL | Status: DC
  Administered 2022-12-27: 20 mg via ORAL
  Filled 2022-12-27: qty 1
  Filled 2022-12-27: qty 2

## 2022-12-27 MED ORDER — VERAPAMIL HCL 2.5 MG/ML IV SOLN
INTRAVENOUS | Status: AC
  Filled 2022-12-27: qty 2

## 2022-12-27 MED ORDER — ENSURE ENLIVE PO LIQD
237.0000 mL | Freq: Two times a day (BID) | ORAL | Status: DC
  Administered 2022-12-28: 237 mL via ORAL

## 2022-12-27 MED ORDER — ACETAMINOPHEN 325 MG PO TABS: 650.0000 mg | ORAL_TABLET | ORAL | Status: DC | PRN

## 2022-12-27 MED ORDER — LISINOPRIL 5 MG PO TABS: 2.5000 mg | ORAL_TABLET | Freq: Every day | ORAL | Status: DC

## 2022-12-27 MED ORDER — IOHEXOL 300 MG/ML  SOLN
INTRAMUSCULAR | Status: DC | PRN
  Administered 2022-12-27: 142 mL

## 2022-12-27 MED ORDER — BIVALIRUDIN BOLUS VIA INFUSION - CUPID
INTRAVENOUS | Status: DC | PRN
  Administered 2022-12-27: 55.425 mg via INTRAVENOUS

## 2022-12-27 MED ORDER — PANTOPRAZOLE SODIUM 40 MG PO TBEC: 40.0000 mg | DELAYED_RELEASE_TABLET | Freq: Every day | ORAL | Status: DC

## 2022-12-27 MED ORDER — MONTELUKAST SODIUM 10 MG PO TABS
10.0000 mg | ORAL_TABLET | Freq: Every day | ORAL | Status: DC
  Administered 2022-12-27: 10 mg via ORAL
  Filled 2022-12-27: qty 1

## 2022-12-27 MED ORDER — INSULIN ASPART 100 UNIT/ML IJ SOLN
INTRAMUSCULAR | Status: AC
  Filled 2022-12-27: qty 1

## 2022-12-27 MED ORDER — CHLORHEXIDINE GLUCONATE CLOTH 2 % EX PADS
6.0000 | MEDICATED_PAD | Freq: Every day | CUTANEOUS | Status: DC
  Administered 2022-12-27: 6 via TOPICAL

## 2022-12-27 MED ORDER — TICAGRELOR 90 MG PO TABS
180.0000 mg | ORAL_TABLET | Freq: Once | ORAL | Status: AC
  Administered 2022-12-27: 180 mg via ORAL

## 2022-12-27 MED ORDER — CYANOCOBALAMIN 1000 MCG/ML IJ SOLN
1000.0000 ug | INTRAMUSCULAR | Status: DC
  Filled 2022-12-27: qty 1

## 2022-12-27 MED ORDER — LABETALOL HCL 5 MG/ML IV SOLN: 10.0000 mg | INTRAVENOUS | Status: AC | PRN

## 2022-12-27 MED ORDER — INSULIN ASPART 100 UNIT/ML IJ SOLN
INTRAMUSCULAR | Status: DC | PRN
  Administered 2022-12-27: 8 [IU] via SUBCUTANEOUS

## 2022-12-27 MED ORDER — METHIMAZOLE 5 MG PO TABS: 5.0000 mg | ORAL_TABLET | ORAL | Status: DC

## 2022-12-27 MED ORDER — TICAGRELOR 90 MG PO TABS
90.0000 mg | ORAL_TABLET | Freq: Two times a day (BID) | ORAL | Status: DC
  Administered 2022-12-27 – 2022-12-28 (×2): 90 mg via ORAL
  Filled 2022-12-27 (×2): qty 1

## 2022-12-27 MED ORDER — HEPARIN SODIUM (PORCINE) 1000 UNIT/ML IJ SOLN
INTRAMUSCULAR | Status: AC
  Filled 2022-12-27: qty 10

## 2022-12-27 MED ORDER — ENOXAPARIN SODIUM 40 MG/0.4ML IJ SOSY
40.0000 mg | PREFILLED_SYRINGE | INTRAMUSCULAR | Status: DC
  Administered 2022-12-27 – 2022-12-28 (×2): 40 mg via SUBCUTANEOUS
  Filled 2022-12-27 (×2): qty 0.4

## 2022-12-27 MED ORDER — BUSPIRONE HCL 10 MG PO TABS
10.0000 mg | ORAL_TABLET | Freq: Every day | ORAL | Status: DC
  Administered 2022-12-27: 10 mg via ORAL
  Filled 2022-12-27: qty 1

## 2022-12-27 MED ORDER — SODIUM CHLORIDE 0.9 % IV SOLN: 250.0000 mL | INTRAVENOUS | Status: DC | PRN

## 2022-12-27 MED ORDER — SODIUM CHLORIDE 0.9% FLUSH
3.0000 mL | Freq: Two times a day (BID) | INTRAVENOUS | Status: DC
  Administered 2022-12-27 – 2022-12-28 (×3): 3 mL via INTRAVENOUS

## 2022-12-27 MED ORDER — BIVALIRUDIN TRIFLUOROACETATE 250 MG IV SOLR
INTRAVENOUS | Status: AC
  Filled 2022-12-27: qty 250

## 2022-12-27 MED ORDER — ACETAMINOPHEN 325 MG PO TABS
650.0000 mg | ORAL_TABLET | Freq: Four times a day (QID) | ORAL | Status: DC | PRN
  Administered 2022-12-27: 650 mg via ORAL
  Filled 2022-12-27: qty 2

## 2022-12-27 MED ORDER — ADULT MULTIVITAMIN W/MINERALS CH
1.0000 | ORAL_TABLET | Freq: Every day | ORAL | Status: DC
  Administered 2022-12-28: 1 via ORAL
  Filled 2022-12-27: qty 1

## 2022-12-27 MED ORDER — EMPAGLIFLOZIN 25 MG PO TABS: 25.0000 mg | ORAL_TABLET | Freq: Every day | ORAL | Status: DC

## 2022-12-27 MED ORDER — GABAPENTIN 300 MG PO CAPS: 600.0000 mg | ORAL_CAPSULE | Freq: Every day | ORAL | Status: DC

## 2022-12-27 MED ORDER — INSULIN ASPART 100 UNIT/ML IJ SOLN: 0.0000 [IU] | Freq: Every day | INTRAMUSCULAR | Status: DC

## 2022-12-27 MED ORDER — ATORVASTATIN CALCIUM 80 MG PO TABS
80.0000 mg | ORAL_TABLET | Freq: Every day | ORAL | 0 refills | Status: DC
  Filled 2022-12-27: qty 30, 30d supply, fill #0

## 2022-12-27 MED ORDER — ORAL CARE MOUTH RINSE: 15.0000 mL | OROMUCOSAL | Status: DC | PRN

## 2022-12-27 MED ORDER — METOPROLOL SUCCINATE ER 50 MG PO TB24
50.0000 mg | ORAL_TABLET | Freq: Every day | ORAL | Status: DC
  Administered 2022-12-27 – 2022-12-28 (×2): 50 mg via ORAL
  Filled 2022-12-27 (×2): qty 1

## 2022-12-27 MED ORDER — ACETAMINOPHEN 650 MG RE SUPP: 650.0000 mg | Freq: Four times a day (QID) | RECTAL | Status: DC | PRN

## 2022-12-27 MED ORDER — INSULIN GLARGINE-YFGN 100 UNIT/ML ~~LOC~~ SOLN
5.0000 [IU] | Freq: Every day | SUBCUTANEOUS | Status: DC
  Administered 2022-12-27: 5 [IU] via SUBCUTANEOUS
  Filled 2022-12-27 (×2): qty 0.05

## 2022-12-27 MED ORDER — ALBUTEROL SULFATE (2.5 MG/3ML) 0.083% IN NEBU: 2.5000 mg | INHALATION_SOLUTION | Freq: Four times a day (QID) | RESPIRATORY_TRACT | Status: DC | PRN

## 2022-12-27 MED ORDER — MIDAZOLAM HCL 2 MG/2ML IJ SOLN
INTRAMUSCULAR | Status: DC | PRN
  Administered 2022-12-27: 1 mg via INTRAVENOUS

## 2022-12-27 MED ORDER — EMPAGLIFLOZIN 25 MG PO TABS
25.0000 mg | ORAL_TABLET | Freq: Every day | ORAL | Status: DC
  Administered 2022-12-27 – 2022-12-28 (×2): 25 mg via ORAL
  Filled 2022-12-27 (×2): qty 1

## 2022-12-27 MED ORDER — SODIUM CHLORIDE 0.9 % WEIGHT BASED INFUSION
1.0000 mL/kg/h | INTRAVENOUS | Status: AC
  Administered 2022-12-27: 1 mL/kg/h via INTRAVENOUS

## 2022-12-27 MED ORDER — ALBUTEROL SULFATE HFA 108 (90 BASE) MCG/ACT IN AERS: 2.0000 | INHALATION_SPRAY | Freq: Four times a day (QID) | RESPIRATORY_TRACT | Status: DC | PRN

## 2022-12-27 MED ORDER — INSULIN ASPART 100 UNIT/ML IJ SOLN: 0.0000 [IU] | Freq: Three times a day (TID) | INTRAMUSCULAR | Status: DC

## 2022-12-27 MED ORDER — BUSPIRONE HCL 10 MG PO TABS
5.0000 mg | ORAL_TABLET | Freq: Two times a day (BID) | ORAL | Status: DC
  Administered 2022-12-27 – 2022-12-28 (×2): 5 mg via ORAL
  Filled 2022-12-27 (×2): qty 1

## 2022-12-27 MED ORDER — METOPROLOL SUCCINATE ER 50 MG PO TB24: 50.0000 mg | ORAL_TABLET | Freq: Every day | ORAL | Status: DC

## 2022-12-27 MED ORDER — SODIUM CHLORIDE 0.9 % IV SOLN: INTRAVENOUS | Status: DC

## 2022-12-27 MED ORDER — LISINOPRIL 5 MG PO TABS
5.0000 mg | ORAL_TABLET | Freq: Every day | ORAL | Status: DC
  Administered 2022-12-27 – 2022-12-28 (×2): 5 mg via ORAL
  Filled 2022-12-27 (×2): qty 1

## 2022-12-27 MED ORDER — LOTEPREDNOL ETABONATE 0.5 % OP SUSP
1.0000 [drp] | Freq: Every day | OPHTHALMIC | Status: DC
  Administered 2022-12-27 – 2022-12-28 (×2): 1 [drp] via OPHTHALMIC
  Filled 2022-12-27: qty 5

## 2022-12-27 MED ORDER — TIMOLOL MALEATE 0.5 % OP SOLN
1.0000 [drp] | Freq: Two times a day (BID) | OPHTHALMIC | Status: DC
  Administered 2022-12-27 – 2022-12-28 (×3): 1 [drp] via OPHTHALMIC
  Filled 2022-12-27: qty 5

## 2022-12-27 MED ORDER — SODIUM CHLORIDE 0.9% FLUSH: 3.0000 mL | INTRAVENOUS | Status: DC | PRN

## 2022-12-27 MED ORDER — METHIMAZOLE 5 MG PO TABS
5.0000 mg | ORAL_TABLET | ORAL | Status: DC
  Administered 2022-12-27: 5 mg via ORAL
  Filled 2022-12-27: qty 1

## 2022-12-27 MED ORDER — FENTANYL CITRATE (PF) 100 MCG/2ML IJ SOLN
INTRAMUSCULAR | Status: DC | PRN
  Administered 2022-12-27: 25 ug via INTRAVENOUS

## 2022-12-27 MED ORDER — VENLAFAXINE HCL ER 75 MG PO CP24
225.0000 mg | ORAL_CAPSULE | Freq: Every day | ORAL | Status: DC
  Administered 2022-12-27 – 2022-12-28 (×2): 225 mg via ORAL
  Filled 2022-12-27 (×2): qty 3

## 2022-12-27 MED ORDER — SODIUM CHLORIDE 0.9 % IV SOLN
INTRAVENOUS | Status: AC | PRN
  Administered 2022-12-27: 1.75 mg/kg/h via INTRAVENOUS

## 2022-12-27 MED ORDER — TRAZODONE HCL 50 MG PO TABS: 25.0000 mg | ORAL_TABLET | Freq: Every evening | ORAL | Status: DC | PRN

## 2022-12-27 MED ORDER — ATORVASTATIN CALCIUM 20 MG PO TABS
80.0000 mg | ORAL_TABLET | Freq: Every day | ORAL | Status: DC
  Administered 2022-12-28: 80 mg via ORAL
  Filled 2022-12-27: qty 4

## 2022-12-27 MED ORDER — TICAGRELOR 90 MG PO TABS
90.0000 mg | ORAL_TABLET | Freq: Two times a day (BID) | ORAL | 0 refills | Status: DC
  Filled 2022-12-27: qty 60, 30d supply, fill #0

## 2022-12-27 MED ORDER — HEPARIN (PORCINE) IN NACL 1000-0.9 UT/500ML-% IV SOLN
INTRAVENOUS | Status: DC | PRN
  Administered 2022-12-27: 1000 mL

## 2022-12-27 MED ORDER — MAGNESIUM HYDROXIDE 400 MG/5ML PO SUSP: 30.0000 mL | Freq: Every day | ORAL | Status: DC | PRN

## 2022-12-27 MED ORDER — ONDANSETRON HCL 4 MG/2ML IJ SOLN: 4.0000 mg | Freq: Four times a day (QID) | INTRAMUSCULAR | Status: DC | PRN

## 2022-12-27 MED ORDER — NITROGLYCERIN 2 % TD OINT
1.0000 [in_us] | TOPICAL_OINTMENT | Freq: Once | TRANSDERMAL | Status: AC
  Administered 2022-12-27: 1 [in_us] via TOPICAL

## 2022-12-27 MED ORDER — ENSURE MAX PROTEIN PO LIQD
11.0000 [oz_av] | Freq: Two times a day (BID) | ORAL | Status: DC
  Administered 2022-12-27 – 2022-12-28 (×2): 11 [oz_av] via ORAL
  Filled 2022-12-27: qty 330

## 2022-12-27 SURGICAL SUPPLY — 19 items
BALLN TREK RX 2.5X15 (BALLOONS) ×1
BALLOON TREK RX 2.5X15 (BALLOONS) IMPLANT
CATH INFINITI 5FR ANG PIGTAIL (CATHETERS) IMPLANT
CATH INFINITI 5FR JL4 (CATHETERS) IMPLANT
CATH VISTA GUIDE 6FR JR4 (CATHETERS) IMPLANT
DEVICE CLOSURE MYNXGRIP 6/7F (Vascular Products) IMPLANT
DRAPE BRACHIAL (DRAPES) IMPLANT
GLIDESHEATH SLEND SS 6F .021 (SHEATH) IMPLANT
GUIDEWIRE INQWIRE 1.5J.035X260 (WIRE) IMPLANT
INQWIRE 1.5J .035X260CM (WIRE)
KIT ENCORE 26 ADVANTAGE (KITS) IMPLANT
NDL PERC 18GX7CM (NEEDLE) IMPLANT
NEEDLE PERC 18GX7CM (NEEDLE) ×1 IMPLANT
PACK CARDIAC CATH (CUSTOM PROCEDURE TRAY) ×1 IMPLANT
SET ATX-X65L (MISCELLANEOUS) IMPLANT
SHEATH AVANTI 6FR X 11CM (SHEATH) IMPLANT
STENT ONYX FRONTIER 3.0X22 (Permanent Stent) IMPLANT
WIRE G HI TQ BMW 190 (WIRE) IMPLANT
WIRE GUIDERIGHT .035X150 (WIRE) IMPLANT

## 2022-12-27 NOTE — H&P (Addendum)
[place]   PATIENT NAME: Caitlyn Roth    MR#:  [id number]  DATE OF BIRTH:  23-Dec-1965  DATE OF ADMISSION:  12/26/2022  PRIMARY CARE PHYSICIAN: Patrice Paradise, MD   Patient is coming from: Home  REQUESTING/REFERRING PHYSICIAN: Dorothyann Peng, MD  CHIEF COMPLAINT:   Chief Complaint  Patient presents with   Code STEMI    HISTORY OF PRESENT ILLNESS:  Caitlyn Roth is a 57 y.o. Caucasian female with medical history significant for type diabetes mellitus, coronary artery disease, status post PCI stent, asthma, osteoarthritis, anxiety, GERD, Graves' disease, hypertension, dyslipidemia, migraine, OSA, CVA, and atrial fibrillation, who presented to the ER with acute onset of midsternal chest pain that started around 6 PM with associated dizziness and dyspnea.  The patient came in as a code STEMI.  He was given aspirin and 1 spray of sublingual nitroglycerin that brought pain down from 12/10 to 6/10.  He had associated fatigue.  No cough or wheezing or hemoptysis.  No leg pain or edema or recent travels or surgeries.  Not any fever or chills.  The patient was having nausea with dry heaves and diaphoresis with her chest pain without abdominal pain.  No bleeding diathesis.  No dysuria, dysuria or hematuria or flank pain.  ED Course: When he came to the ER, vital signs were within normal.  Labs revealed mild hyponatremia 131, mild hypochloremia of 95 and significant hyperglycemia 531 with otherwise unremarkable CMP.  High sensitive troponin was 26.  Lipid profile showed total cholesterol 238 and LDL 133 with VLDL of 78 and triglycerides 388.  CBC showed WBC of 10.7 and was otherwise unremarkable.  Coag profile was normal.  EKG as reviewed by me : showed inferior ST segment elevation MI with reciprocal T wave inversion anteroseptally and laterally. Imaging: Portable chest x-ray showed no acute cardiopulmonary disease.  The patient was taken to the Cath Lab by Dr. Juliann Pares and  underwent PCI and mid RCA stent for 99% stenosis.  She is admitted to the ICU for further evaluation and management. PAST MEDICAL HISTORY:   Past Medical History:  Diagnosis Date   A-fib [facility])    a.) CHA2DS2VASc = 6 (sex, HTN, CVA/TIA x2,  vascular disease history, T2DM);  b.) rate/rhythm maintained on oral metoprolol succinate; no chronic anticoagulation   Acute ST elevation myocardial infarction (STEMI) of inferior wall (HCC) 01/28/2015   a.) LHC/PCI 01/28/2015 --> 70% pRCA (3.5 x 20 mm Promus Premier)   ADHD (attention deficit hyperactivity disorder)    a.) on amphetamine-dextroamphetamine   Anxiety    Arthritis    Asthma    CAD (coronary artery disease) 01/28/2015   a.) LHC/PCI 01/28/2015 --> 70% pRCA (3.5 x 20 mm Promus Premier DES)   Diabetic polyneuropathy (HCC)    Diastolic dysfunction 01/28/2015   a.) TTE 01/28/2015: EF 50, inf/post HK, mild BAE, triv MR/TR, G1DD   Eczema    Family history of adverse reaction to anesthesia    a.) delayed emergence in 1st degree relative (father)   [name] corneal dystrophy    GERD (gastroesophageal reflux disease)    Graves disease    a.) on methimazole   Hepatic steatosis    Hyperlipemia    Hypertension    Migraines    Right rotator cuff tendinitis    Sleep apnea    Stroke (HCC) 2009   peripheral vision in left eye has been affected,   T2DM (type 2 diabetes mellitus) (HCC)  Vitamin B 12 deficiency     PAST SURGICAL HISTORY:   Past Surgical History:  Procedure Laterality Date   CARPOMETACARPAL (CMC) FUSION OF THUMB Left 08/07/2022   Procedure: LEFT THUMB CMC ARTHROPLASTY;  Surgeon: Christena Flake, MD;  Location: [facility] ORS;  Service: Orthopedics;  Laterality: Left;   CESAREAN SECTION     CHOLECYSTECTOMY     CORONARY ANGIOPLASTY WITH STENT PLACEMENT Left 01/28/2015   Procedure: CORONARY ANGIOPLASTY WITH STENT PLACEMENT; Location: [facility]   KNEE ARTHROSCOPY Left    KNEE SURGERY Left 1990   McKay's procedure   SHOULDER ARTHROSCOPY  WITH OPEN ROTATOR CUFF REPAIR Right 10/01/2016   Procedure: SHOULDER ARTHROSCOPY WITH OPEN ROTATOR CUFF REPAIR;  Surgeon: Christena Flake, MD;  Location: [facility] ORS;  Service: Orthopedics;  Laterality: Right;  Shoulder Block    SINUS SURGERY WITH INSTATRAK     TONSILLECTOMY  1978   TUBAL LIGATION      SOCIAL HISTORY:   Social History   Tobacco Use   Smoking status: Every Day    Current packs/day: 0.50    Average packs/day: 0.5 packs/day for 35.0 years (17.5 ttl pk-yrs)    Types: Cigarettes   Smokeless tobacco: Never  Substance Use Topics   Alcohol use: Yes    Comment: rare socially    FAMILY HISTORY:   Family History  Problem Relation Age of Onset   Breast cancer Maternal Aunt 60    DRUG ALLERGIES:   Allergies  Allergen Reactions   Cefdinir Nausea And Vomiting and Other (See Comments)    Swelling of throat.   Milk (Cow) Other (See Comments)    Other reaction(s): OTHER Other reaction(s): OTHER   Prednisone     Other reaction(s): Other (See Comments) LOST 40% OF VISION IN ONE EYE  Patient reports she is allergic to "all steroids"   Acyclovir Nausea And Vomiting    Other reaction(s): Unknown   Codeine Nausea Only   Other Nausea Only    Antibiotic (oral) patient unsure which one    REVIEW OF SYSTEMS:   ROS As per history of present illness. All pertinent systems were reviewed above. Constitutional, HEENT, cardiovascular, respiratory, GI, GU, musculoskeletal, neuro, psychiatric, endocrine, integumentary and hematologic systems were reviewed and are otherwise negative/unremarkable except for positive findings mentioned above in the HPI.   MEDICATIONS AT HOME:   Prior to Admission medications   Medication Sig Start Date End Date Taking? Authorizing Provider  albuterol (VENTOLIN HFA) 108 (90 Base) MCG/ACT inhaler Inhale 2 puffs into the lungs every 6 (six) hours as needed for wheezing or shortness of breath.    [provider]  amphetamine-dextroamphetamine  (ADDERALL XR) 30 MG 24 hr capsule Take 30 mg by mouth daily. 02/19/14   [provider]  aspirin EC 81 MG tablet Take 81 mg by mouth daily. Swallow whole.    [provider]  brimonidine (ALPHAGAN) 0.2 % ophthalmic solution Place 1 drop into the right eye 3 (three) times daily.    [provider]  busPIRone (BUSPAR) 10 MG tablet Take 10 mg by mouth 3 (three) times daily.    [provider]  Cyanocobalamin (B-12) 1000 MCG/ML KIT Inject 1 mL as directed once a week.    [provider]  desloratadine (CLARINEX) 5 MG tablet Take 5 mg by mouth daily.     [provider]  empagliflozin (JARDIANCE) 25 MG TABS tablet Take 25 mg by mouth daily.    [provider]  gabapentin (NEURONTIN) 300  MG capsule Take 600 mg by mouth at bedtime.    [provider]  lisinopril (PRINIVIL,ZESTRIL) 2.5 MG tablet Take 1 tablet by mouth daily. 04/22/15   [provider]  loteprednol (LOTEMAX) 0.5 % ophthalmic suspension Place 1 drop into the right eye daily.    [provider]  methimazole (TAPAZOLE) 5 MG tablet Take 5 mg by mouth at bedtime.    [provider]  metoprolol succinate (TOPROL-XL) 50 MG 24 hr tablet Take 50 mg by mouth at bedtime. Take with or immediately following a meal.    [provider]  montelukast (SINGULAIR) 10 MG tablet Take 10 mg by mouth at bedtime.    [provider]  pantoprazole (PROTONIX) 40 MG tablet Take 40 mg by mouth at bedtime.    [provider]  rosuvastatin (CRESTOR) 10 MG tablet Take 10 mg by mouth at bedtime.    [provider]  timolol (BETIMOL) 0.5 % ophthalmic solution Place 1 drop into the right eye 2 (two) times daily.    [provider]  venlafaxine XR (EFFEXOR-XR) 75 MG 24 hr capsule Take 225 mg by mouth daily with breakfast.    [provider]      VITAL SIGNS:  Blood pressure (!) 140/83, pulse 66, temperature 97.9 F (36.6  C), temperature source Oral, resp. rate (!) 8, height 5\' 9"  (1.753 m), weight 74.1 kg, last menstrual period 09/21/2014, SpO2 98%.  PHYSICAL EXAMINATION:  Physical Exam  GENERAL:  57 y.o.-year-old patient lying in the bed with no acute distress.  EYES: Pupils equal, round, reactive to light and accommodation. No scleral icterus. Extraocular muscles intact.  HEENT: Head atraumatic, normocephalic. Oropharynx and nasopharynx clear.  NECK:  Supple, no jugular venous distention. No thyroid enlargement, no tenderness.  LUNGS: Normal breath sounds bilaterally, no wheezing, rales,rhonchi or crepitation. No use of accessory muscles of respiration.  CARDIOVASCULAR: Regular rate and rhythm, S1, S2 normal. No murmurs, rubs, or gallops.  ABDOMEN: Soft, nondistended, nontender. Bowel sounds present. No organomegaly or mass.  EXTREMITIES: No pedal edema, cyanosis, or clubbing.  NEUROLOGIC: Cranial nerves II through XII are intact. Muscle strength 5/5 in all extremities. Sensation intact. Gait not checked.  PSYCHIATRIC: The patient is alert and oriented x 3.  Normal affect and good eye contact. SKIN: No obvious rash, lesion, or ulcer.   LABORATORY PANEL:   CBC Recent Labs  Lab 12/27/22 0200  WBC 8.6  HGB 13.1  HCT 37.5  PLT 217   ------------------------------------------------------------------------------------------------------------------  Chemistries  Recent Labs  Lab 12/26/22 2354 12/27/22 0200  NA 131* 129*  K 4.0 4.0  CL 95* 96*  CO2 26 24  GLUCOSE 531* 415*  BUN 11 11  CREATININE 0.68 0.61  CALCIUM 9.1 8.9  AST 16  --   ALT 20  --   ALKPHOS 121  --   BILITOT 0.9  --    ------------------------------------------------------------------------------------------------------------------  Cardiac Enzymes No results for input(s): "TROPONINI" in the last 168  hours. ------------------------------------------------------------------------------------------------------------------  RADIOLOGY:  CARDIAC CATHETERIZATION  Result Date: 12/27/2022   Mid RCA lesion is 99% stenosed.   A drug-eluting stent was successfully placed using a STENT ONYX FRONTIER 3.0X22.   Post intervention, there is a 0% residual stenosis.   There is mild left ventricular systolic dysfunction.   LV end diastolic pressure is normal.   The left ventricular ejection fraction is 45-50% by visual estimate.   As long as the patient continues to meet low risk STEMI criteria and in  the absence of any other complications or medical issues, we expect the patient to be ready for discharge on 12/29/2022.   Recommend uninterrupted dual antiplatelet therapy with Aspirin 81mg  daily and Ticagrelor 90mg  twice daily for a minimum of 12 months (ACS-Class I recommendation). Conclusion STEMI presentation Diagnostic Left system relatively free of disease Large left main no significant disease Large LAD with a distal 75% lesion Circumflex large minor irregularities RCA large proximal previous stent widely patent mid RCA 99% thrombotic lesion IRA TIMI II flow right dominant system Intervention PCI and stent DES to mid RCA overlapping the distal part of the previous stent 3.0 x 22 mm frontier Onyx to 18 atm lesion reduced from 99 down to 0 TIMI-3 flow restored Mynx deployed right femoral artery Patient maintained on aspirin Brilinta for at least 12 months Angiomax at reduced rate for an additional 2 hours Patient transferred to ICU hospitalist contacted for medical management Anticipate discharge 48 to 72 hours   DG Chest Port 1 View  Result Date: 12/27/2022 CLINICAL DATA:  STEMI EXAM: PORTABLE CHEST 1 VIEW COMPARISON:  02/19/2019 FINDINGS: The heart size and mediastinal contours are within normal limits. Both lungs are clear. The visualized skeletal structures are unremarkable. IMPRESSION: No active disease.  Electronically Signed   By: Charlett Nose M.D.   On: 12/27/2022 00:11      IMPRESSION AND PLAN:  Assessment and Plan: * STEMI involving right coronary artery [facility]) - The patient be admitted to an ICU bed. - Will continue the patient on aspirin and Brilinta that are recommended for 12 months. - High-dose statin therapy will be provided. - Beta-blocker therapy will be continued. - Will continue ACE inhibitor therapy. - The patient be placed on Nitropaste as well as as needed sublingual nitroglycerin IV morphine sulfate for pain.  Uncontrolled type 2 diabetes mellitus with hyperglycemia, without long-term current use of insulin (HCC) - The patient be placed on frequent fingerstick blood glucose measures with high resistant NovoLog coverage. - She we will be kept n.p.o. till improvement of her blood glucose levels. - Will continue Jardiance.  Hyperthyroidism - Will continue Tapazole. - Will check TSH level.  Hyponatremia - This is likely pseudohyponatremia due to hyperglycemia - She will be hydrated with IV normal saline and will follow BMP.  GERD without esophagitis - Continue PPI therapy.  Anxiety and depression - Will continue BuSpar and Effexor XR.   DVT prophylaxis: Lovenox. Advanced Care Planning:  Code Status: Thi with her and she clearly indicateds was discussed she wants to be DNR and DNI. Family Communication:  The plan of care was discussed in details with the patient (and family). I answered all questions. The patient agreed to proceed with the above mentioned plan. Further management will depend upon hospital course. Disposition Plan: Back to previous home environment Consults called: Cardiology All the records are reviewed and case discussed with ED provider.  Status is: Inpatient   At the time of the admission, it appears that the appropriate admission status for this patient is inpatient.  This is judged to be reasonable and necessary in order to provide the  required intensity of service to ensure the patient's safety given the presenting symptoms, physical exam findings and initial radiographic and laboratory data in the context of comorbid conditions.  The patient requires inpatient status due to high intensity of service, high risk of further deterioration and high frequency of surveillance required.  I certify that at the time of admission, it is my clinical judgment  that the patient will require inpatient hospital care extending more than 2 midnights.                            Dispo: The patient is from: Home              Anticipated d/c is to: Home              Patient currently is not medically stable to d/c.              Difficult to place patient: No  Hannah Beat M.D on 12/27/2022 at 3:23 AM  [facility]   From 7 PM-7 AM, contact night-coverage [email or web address]  CC: Primary care physician; Patrice Paradise, MD

## 2022-12-27 NOTE — TOC Benefit Eligibility Note (Signed)
Patient Product/process development scientist completed.    The patient is insured through [facility]. Patient has [employer], may use a copay card, and/or apply for patient assistance if available.    Ran test claim for Brilinta 90 mg and the current 30 day co-pay is $0.00.   This test claim was processed through [facility]- copay amounts may vary at other pharmacies due to pharmacy/plan contracts, or as the patient moves through the different stages of their insurance plan.     Roland Earl, CPHT Pharmacy Patient Advocate Specialist [facility] Patient Advocate Team Direct Number: [phone]  Fax: [phone]

## 2022-12-27 NOTE — Assessment & Plan Note (Signed)
-   Will continue BuSpar and Effexor XR.

## 2022-12-27 NOTE — Progress Notes (Signed)
[facility] CLINIC CARDIOLOGY CONSULT NOTE       Patient ID: Caitlyn Roth MRN: [id number] DOB/AGE: 09-29-1965 57 y.o.  Admit date: 12/26/2022 Referring Physician Dr Chiquita Loth Primary Physician Maurine Minister, PA-C Primary Cardiologist prev Dr. Lady Gary  Reason for Consultation inferior STEMI  HPI: Caitlyn Roth. Yorke is a 57yoF with a PMH of inferior STEMI s/p PCI to prox RCA (01/28/2015, [facility]), DM2, HTN, HLD, hyperthyroidism, hx tobacco use who presented to [facility] ED late evening 8/8 with severe 12/10 central chest pressure, off and on x 6 hours, similar in character to her prior MI. EKG with inferior STE, taken emergently to the cath lab by Dr. Juliann Pares early AM 8/9, now s/p successful PCI with onyx frontier 3.0 x 22mm stent to mid RCA, overlapping distal portion of previous stent.   Interval History:  - seen and examined this AM with mother at bedside.  - chest pain free, laying flat in bed. No dyspnea, palpitations, or LE swelling  Social: widowed, husband passed away last year, stopped smoking after her previous MI in 2016  Review of systems complete and found to be negative unless listed above     Past Medical History:  Diagnosis Date   A-fib [facility])    a.) CHA2DS2VASc = 6 (sex, HTN, CVA/TIA x2,  vascular disease history, T2DM);  b.) rate/rhythm maintained on oral metoprolol succinate; no chronic anticoagulation   Acute ST elevation myocardial infarction (STEMI) of inferior wall (HCC) 01/28/2015   a.) LHC/PCI 01/28/2015 --> 70% pRCA (3.5 x 20 mm Promus Premier)   ADHD (attention deficit hyperactivity disorder)    a.) on amphetamine-dextroamphetamine   Anxiety    Arthritis    Asthma    CAD (coronary artery disease) 01/28/2015   a.) LHC/PCI 01/28/2015 --> 70% pRCA (3.5 x 20 mm Promus Premier DES)   Diabetic polyneuropathy (HCC)    Diastolic dysfunction 01/28/2015   a.) TTE 01/28/2015: EF 50, inf/post HK, mild BAE, triv MR/TR, G1DD   Eczema    Family history of adverse reaction to  anesthesia    a.) delayed emergence in 1st degree relative (father)   [name] corneal dystrophy    GERD (gastroesophageal reflux disease)    Graves disease    a.) on methimazole   Hepatic steatosis    Hyperlipemia    Hypertension    Migraines    Right rotator cuff tendinitis    Sleep apnea    Stroke (HCC) 2009   peripheral vision in left eye has been affected,   T2DM (type 2 diabetes mellitus) (HCC)    Vitamin B 12 deficiency     Past Surgical History:  Procedure Laterality Date   CARPOMETACARPAL (CMC) FUSION OF THUMB Left 08/07/2022   Procedure: LEFT THUMB CMC ARTHROPLASTY;  Surgeon: Christena Flake, MD;  Location: [facility] ORS;  Service: Orthopedics;  Laterality: Left;   CESAREAN SECTION     CHOLECYSTECTOMY     CORONARY ANGIOPLASTY WITH STENT PLACEMENT Left 01/28/2015   Procedure: CORONARY ANGIOPLASTY WITH STENT PLACEMENT; Location: [facility]   KNEE ARTHROSCOPY Left    KNEE SURGERY Left 1990   McKay's procedure   SHOULDER ARTHROSCOPY WITH OPEN ROTATOR CUFF REPAIR Right 10/01/2016   Procedure: SHOULDER ARTHROSCOPY WITH OPEN ROTATOR CUFF REPAIR;  Surgeon: Christena Flake, MD;  Location: [facility] ORS;  Service: Orthopedics;  Laterality: Right;  Shoulder Block    SINUS SURGERY WITH INSTATRAK     TONSILLECTOMY  1978   TUBAL LIGATION      Medications Prior to Admission  Medication Sig Dispense Refill Last Dose   fluconazole (DIFLUCAN) 100 MG tablet Take 100 mg by mouth daily.      albuterol (VENTOLIN HFA) 108 (90 Base) MCG/ACT inhaler Inhale 2 puffs into the lungs every 6 (six) hours as needed for wheezing or shortness of breath.      amphetamine-dextroamphetamine (ADDERALL XR) 30 MG 24 hr capsule Take 30 mg by mouth daily.      aspirin EC 81 MG tablet Take 81 mg by mouth daily. Swallow whole.      brimonidine (ALPHAGAN) 0.2 % ophthalmic solution Place 1 drop into the right eye 3 (three) times daily.      busPIRone (BUSPAR) 10 MG tablet Take 10 mg by mouth 3 (three) times daily.      busPIRone  (BUSPAR) 5 MG tablet Take 5 mg by mouth 3 (three) times daily.      Cyanocobalamin (B-12) 1000 MCG/ML KIT Inject 1 mL as directed once a week.      desloratadine (CLARINEX) 5 MG tablet Take 5 mg by mouth daily.       empagliflozin (JARDIANCE) 25 MG TABS tablet Take 25 mg by mouth daily.      gabapentin (NEURONTIN) 300 MG capsule Take 600 mg by mouth at bedtime.      lisinopril (PRINIVIL,ZESTRIL) 2.5 MG tablet Take 1 tablet by mouth daily.  1    loteprednol (LOTEMAX) 0.5 % ophthalmic suspension Place 1 drop into the right eye daily.      methimazole (TAPAZOLE) 5 MG tablet Take 5 mg by mouth at bedtime.      metoprolol succinate (TOPROL-XL) 50 MG 24 hr tablet Take 50 mg by mouth at bedtime. Take with or immediately following a meal.      montelukast (SINGULAIR) 10 MG tablet Take 10 mg by mouth at bedtime.      pantoprazole (PROTONIX) 40 MG tablet Take 40 mg by mouth at bedtime.      rosuvastatin (CRESTOR) 10 MG tablet Take 10 mg by mouth at bedtime.      timolol (BETIMOL) 0.5 % ophthalmic solution Place 1 drop into the right eye 2 (two) times daily.      timolol (TIMOPTIC) 0.5 % ophthalmic solution Place 1 drop into the right eye 2 (two) times daily.      venlafaxine XR (EFFEXOR-XR) 75 MG 24 hr capsule Take 225 mg by mouth daily with breakfast.      Social History   Socioeconomic History   Marital status: Married    Spouse name: Not on file   Number of children: Not on file   Years of education: Not on file   Highest education level: Not on file  Occupational History   Not on file  Tobacco Use   Smoking status: Every Day    Current packs/day: 0.50    Average packs/day: 0.5 packs/day for 35.0 years (17.5 ttl pk-yrs)    Types: Cigarettes   Smokeless tobacco: Never  Vaping Use   Vaping status: Never Used  Substance and Sexual Activity   Alcohol use: Yes    Comment: rare socially   Drug use: No   Sexual activity: Yes  Other Topics Concern   Not on file  Social History Narrative    Not on file   Social Determinants of Health   Financial Resource Strain: Not on file  Food Insecurity: No Food Insecurity (12/27/2022)   Hunger Vital Sign    Worried About Running Out of Food in the Last Year: Never  true    Ran Out of Food in the Last Year: Never true  Transportation Needs: No Transportation Needs (12/27/2022)   PRAPARE - Administrator, Civil Service (Medical): No    Lack of Transportation (Non-Medical): No  Physical Activity: Not on file  Stress: Not on file  Social Connections: Not on file  Intimate Partner Violence: Not At Risk (12/27/2022)   Humiliation, Afraid, Rape, and Kick questionnaire    Fear of Current or Ex-Partner: No    Emotionally Abused: No    Physically Abused: No    Sexually Abused: No    Family History  Problem Relation Age of Onset   Breast cancer Maternal Aunt 60      Intake/Output Summary (Last 24 hours) at 12/27/2022 0855 Last data filed at 12/27/2022 0800 Gross per 24 hour  Intake 1568.89 ml  Output 500 ml  Net 1068.89 ml    Vitals:   12/27/22 0500 12/27/22 0700 12/27/22 0800 12/27/22 0805  BP: (!) 148/75 129/83 128/78   Pulse: 61 62 62 62  Resp: 12 (!) 7 14 13   Temp:   (!) 97.5 F (36.4 C)   TempSrc:   Oral   SpO2: 98% 99% 98% 98%  Weight:      Height:        PHYSICAL EXAM General: middle aged female , well nourished, in no acute distress. Laying on right side in stepdown bed with mother present HEENT:  Normocephalic and atraumatic. Neck:  No JVD.  Lungs: Normal respiratory effort on room air. Decreased breath sounds, no crackles or wheezing Heart: HRRR . Normal S1 and S2 without gallops or murmurs.  Abdomen: Non-distended appearing.  Msk: Normal strength and tone for age. Extremities: Warm and well perfused. No clubbing, cyanosis. No peripheral edema. R groin with C/D/I gauze and tegaderm without tenderness, ecchymosis, or apparent aneurysm  Neuro: Alert and oriented X 3. Psych:  Answers questions appropriately.    Labs: Basic Metabolic Panel: Recent Labs    12/26/22 2354 12/27/22 0200  NA 131* 129*  K 4.0 4.0  CL 95* 96*  CO2 26 24  GLUCOSE 531* 415*  BUN 11 11  CREATININE 0.68 0.61  CALCIUM 9.1 8.9   Liver Function Tests: Recent Labs    12/26/22 2354  AST 16  ALT 20  ALKPHOS 121  BILITOT 0.9  PROT 7.0  ALBUMIN 3.9   No results for input(s): "LIPASE", "AMYLASE" in the last 72 hours. CBC: Recent Labs    12/26/22 2354 12/27/22 0200  WBC 10.7* 8.6  NEUTROABS 6.7  --   HGB 14.2 13.1  HCT 40.9 37.5  MCV 89.3 88.4  PLT 293 217   Cardiac Enzymes: Recent Labs    12/26/22 2354 12/27/22 0202 12/27/22 0324  TROPONINIHS 26* 574* 1,443*   BNP: No results for input(s): "BNP" in the last 72 hours. D-Dimer: No results for input(s): "DDIMER" in the last 72 hours. Hemoglobin A1C: Recent Labs    12/27/22 0202  HGBA1C 13.6*   Fasting Lipid Panel: Recent Labs    12/26/22 2354  CHOL 238*  HDL 27*  LDLCALC 133*  TRIG 388*  CHOLHDL 8.8   Thyroid Function Tests: No results for input(s): "TSH", "T4TOTAL", "T3FREE", "THYROIDAB" in the last 72 hours.  Invalid input(s): "FREET3" Anemia Panel: No results for input(s): "VITAMINB12", "FOLATE", "FERRITIN", "TIBC", "IRON", "RETICCTPCT" in the last 72 hours.   Radiology: CARDIAC CATHETERIZATION  Result Date: 12/27/2022   Mid RCA lesion is 99% stenosed.  A drug-eluting stent was successfully placed using a STENT ONYX FRONTIER 3.0X22.   Post intervention, there is a 0% residual stenosis.   There is mild left ventricular systolic dysfunction.   LV end diastolic pressure is normal.   The left ventricular ejection fraction is 45-50% by visual estimate.   As long as the patient continues to meet low risk STEMI criteria and in the absence of any other complications or medical issues, we expect the patient to be ready for discharge on 12/29/2022.   Recommend uninterrupted dual antiplatelet therapy with Aspirin 81mg  daily and Ticagrelor 90mg   twice daily for a minimum of 12 months (ACS-Class I recommendation). Conclusion STEMI presentation Diagnostic Left system relatively free of disease Large left main no significant disease Large LAD with a distal 75% lesion Circumflex large minor irregularities RCA large proximal previous stent widely patent mid RCA 99% thrombotic lesion IRA TIMI II flow right dominant system Intervention PCI and stent DES to mid RCA overlapping the distal part of the previous stent 3.0 x 22 mm frontier Onyx to 18 atm lesion reduced from 99 down to 0 TIMI-3 flow restored Mynx deployed right femoral artery Patient maintained on aspirin Brilinta for at least 12 months Angiomax at reduced rate for an additional 2 hours Patient transferred to ICU hospitalist contacted for medical management Anticipate discharge 48 to 72 hours   DG Chest Port 1 View  Result Date: 12/27/2022 CLINICAL DATA:  STEMI EXAM: PORTABLE CHEST 1 VIEW COMPARISON:  02/19/2019 FINDINGS: The heart size and mediastinal contours are within normal limits. Both lungs are clear. The visualized skeletal structures are unremarkable. IMPRESSION: No active disease. Electronically Signed   By: Charlett Nose M.D.   On: 12/27/2022 00:11    LHC 01/2015 [facility] Cardiac catheterization:  Coronary arteries Dominance: right Left main: normal LAD: insignificant LCx: insignificant RCA: prox 70% - thrombus, TIMI 2 Hemodynamics (mm Hg) State: Baseline Ao/BP (asc Ao): 114/68 Mean: 87 mmHg Interventions Lesion: Proximal RCA 70% TIMI 2 to TIMI 3 CATH, DILAT BALLOON EUPHORA 2.5X15MM STENT, PROMUS PREMIER MR 3.50X20MM Impressions 1. Patient was pain free upon arrival to cath lab and resolved ST segments. 2. Minimal disease in the LAD and circumflex. 3. 70% proximal RCA stenosis with thrombus and some collateral filling from the left of the PDA   ECHO 01/28/2023 DOPPLER ECHO and OTHER SPECIAL PROCEDURES ------------------------------------     Aortic: No AR                   No AS      Mitral: TRIVIAL MR             No MS     MV Inflow E Vel.= nm* cm/s  MV Annulus E'Vel.= nm* cm/s  E/E'Ratio= nm*   Tricuspid: TRIVIAL TR             No TS   Pulmonary: No PR                  No PS       Other:   INTERPRETATION ---------------------------------------------------------------    MILD LV DYSFUNCTION (See above)    NORMAL LA PRESSURES WITH DIASTOLIC DYSFUNCTION    NORMAL RIGHT VENTRICULAR SYSTOLIC FUNCTION    VALVULAR REGURGITATION: TRIVIAL MR, TRIVIAL TR    NO VALVULAR STENOSIS    NO PRIOR STUDY FOR COMPARISON   (Report version 3.0)                    Interpreted  and Electronically signed   Perform. by: Dewain Penning, RCS, RCCS                 by: Kerrie Buffalo, MD   Resp.Person: Dewain Penning, RCS, RCCS                 On: 01/29/2015 13:10:11   TELEMETRY reviewed by me (LT) 12/27/2022 : NSR 60s  EKG reviewed by me: NSR inferior STE with reciprocal anterior changes  Data reviewed by me (LT) 12/27/2022: last outpatient cardiology note, ED note, admission H&P last 24h vitals tele labs imaging I/O    Principal Problem:   STEMI involving right coronary artery [facility]) Active Problems:   Hyperthyroidism   Acute ST elevation myocardial infarction (STEMI) of inferior wall (HCC)   Uncontrolled type 2 diabetes mellitus with hyperglycemia, without long-term current use of insulin (HCC)   Anxiety and depression   GERD without esophagitis   Hyponatremia    ASSESSMENT AND PLAN:  Yalda Gornall. Waguespack is a 57yoF with a PMH of inferior STEMI s/p PCI to prox RCA (01/28/2015, [facility]), DM2, HTN, HLD, hyperthyroidism, hx tobacco use who presented to [facility] ED late evening 8/8 with severe 12/10 central chest pressure, off and on x 6 hours, similar in character to her prior MI. EKG with inferior STE, taken emergently to the cath lab by Dr. Juliann Pares early AM 8/9, now s/p successful PCI with onyx frontier 3.0 x 22mm stent to mid RCA, overlapping distal portion of previous stent.   # inferior  STEMI Chest pain free this AM. Echo pending today. LV gram estimated mild reduced EF 45-50%.  - aspirin 81mg  & ticagrelor 90mg  BID uninterrupted x 1 year - atorva 80 - consider repatha outpatient - metoprolol XL 50mg  daily - lisinopril 5mg , consider change to ARB/ARNI - echo complete - ambulate today, ok for transfer out of stepdown to floor - cardiac rehab at discharge   # uncontrolled DM2 - A1c 13.6%, up trending since 05/2022 at 7.8% - needs insulin, DM education - jardiance 25mg  daily - has outpatient referral to [facility] endocrinology   # hyperlipidemia  TC 238, HDL 27, LDL 133, trigs 388, VLDL 78  Anticipate discharge readiness from a cardiac perspective tomorrow AM.   This patient's plan of care was discussed and created with Dr. Juliann Pares and he is in agreement.  Signed: Rebeca Allegra , PA-C 12/27/2022, 8:55 AM [facility] Cardiology

## 2022-12-27 NOTE — Telephone Encounter (Signed)
Pharmacy Patient Advocate Encounter  Received notification from [facility] that Prior Authorization for FreeStyle Libre 3 Sensor  has been APPROVED from 12/27/2022 to 12/27/2023. Ran test claim, Copay is $0.00. This test claim was processed through [facility]- copay amounts may vary at other pharmacies due to pharmacy/plan contracts, or as the patient moves through the different stages of their insurance plan.   PA #/Case ID/Reference #: [id number]

## 2022-12-27 NOTE — Assessment & Plan Note (Signed)
-   Will continue Tapazole. - Will check TSH level.

## 2022-12-27 NOTE — Progress Notes (Signed)
Chaplain provided Caitlyn Roth support in ED during STEMI. 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 Her mother lives by and a sister in [place] who was contacted during the visit. Teja is concerned about who will take care of her at discharge. Chaplain offered supportive presence and listening support.    12/27/22 0100  Spiritual Encounters  Type of Visit Initial  Care provided to: Patient  Conversation partners present during encounter Nurse  Referral source Nurse (RN/NT/LPN)  OnCall Visit Yes

## 2022-12-27 NOTE — ED Notes (Signed)
Cardiologist to bedside.  

## 2022-12-27 NOTE — Assessment & Plan Note (Signed)
-   The patient be admitted to an ICU bed. - Will continue the patient on aspirin and Brilinta that are recommended for 12 months. - High-dose statin therapy will be provided. - Beta-blocker therapy will be continued. - Will continue ACE inhibitor therapy. - The patient be placed on Nitropaste as well as as needed sublingual nitroglycerin IV morphine sulfate for pain.

## 2022-12-27 NOTE — Inpatient Diabetes Management (Addendum)
Inpatient Diabetes Program Recommendations  AACE/ADA: New Consensus Statement on Inpatient Glycemic Control (2015)  Target Ranges:  Prepandial:   less than 140 mg/dL      Peak postprandial:   less than 180 mg/dL (1-2 hours)      Critically ill patients:  140 - 180 mg/dL    Latest Reference Range & Units 12/27/22 02:02  Hemoglobin A1C 4.8 - 5.6 % 13.6 (H)  343 mg/dl  (H): Data is abnormally high  Latest Reference Range & Units 12/26/22 23:54  Glucose 70 - 99 mg/dL 409 (HH)  (HH): Data is critically high  Latest Reference Range & Units 12/27/22 01:35 12/27/22 04:08 12/27/22 05:28  Glucose-Capillary 70 - 99 mg/dL  8 units Novolog @0112  434 (H)   20 units Novolog @0238    289 (H) 246 (H)  (H): Data is abnormally high    Admit with: Code STEMI   History: DM2  Home DM Meds: Jardiance 25 mg daily        Metformin 500 mg BID  Current Orders: Novolog Resistant Correction Scale/ SSI (0-20 units) Q4 hours      Semglee 5 units at bedtime      Jardiance 25 mg daily    PCP: Maurine Minister, PA with [facility] Last seen 11/04/2022 A1c at that visit was 9.7%   Addendum 11am--Met w/ pt at bedside this AM.  Pt was sleeping but awoke easily and was able to answer all my questions.  Pt told me she has NEVER taken Metformin and Refuses to ever take Metformin.  Was taking Jardiance but stopped about 6 months ago--Unsure why she stopped?  Also told me she used to take a once weekly injection for her diabetes, but that was several years ago and can't remember the name of the med.  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  Went to her PCP office 08/08 for lab work--went home and laid down and awoke with CP and came to the ED.  I asked pt if she has ever asked her PCP about using CGM for home [facility] [place]) to reduce burden of fingerstick CBGs and so that she can know her glucose data at home--Pt stated she  has never talked with her PCP about this.  During our conversation, noted that pt seemed to have a flat affect.  RN told me pt's husband passed away within the last year.    Spoke with patient about her current A1c of 13.6%.  Explained what an A1c is and what it measures.  Reminded patient that her goal A1c is 7% or less per ADA standards to prevent both acute and long-term complications.  Explained to patient the extreme importance of good glucose control at home especially to present chronic complications and further cardiac events like MI and CVA.    Addendum 2pm--Went back to see pt this afternoon.  Was given permission by Dr. Mariane Masters to give pt sample of the [facility] 3 CGM--PA Pending for this device for home from [facility] Pharm[facility].  Reviewed Freestyle Libre 3 CGM system (how to use, how to place new sensor, sensor life, troubleshooting, warm-up time, how to use app on phone, rotation of insertion sites, etc).  Assisted pt to download the Freestyle app to her Phone.  Demonstrated on myself how to apply the sensor on my upper arm.  Did not place sensor on pt as she has an MRI  scheduled for next week--Pt stated she will apply the 1st sensor after her MRI.  Pt educated about 1 hour warm up period.  Pt instructed to replace sensor in 14 days.  Encouraged pt to monitor for CBGs pre and post meals.  Reviewed healthy CBG goals for home.  Pt asked to seek refills for the sensors from their PCP.  Pt educated to check fingerstick CBG with traditional CBG meter when glucose reading does not match how they feels.  RN made aware that CGM given to pt.  2 samples of the FSL 3 + Instructional manual placed into pt's belongings bag with her permission.  Pt told me her friend uses the FSL 3 and can help her place it when she goes home if she needs help.  Also verbally reviewed with pt how to use an insulin pen if she goes home on insulin.  Pt declined to do hands on demo stating she already knows how to use a pen device.   Stated she will use insulin if needed as long as it's a pen.  I verbally reviewed with pt all the steps of insulin pen use.  Reminded pt she will need to put on and remove a pen needle with each injection.  Reviewed importance of rotation of sites, storage, holding needle in skin for 5-10 seconds after injection, etc.  Pt stated to me she had to dial the dose on her pen device before and feels comfortable doing this.  I have placed an insulin pen video (QR code) into pt's AVS.    --Will follow patient during hospitalization--  Ambrose Finland RN, MSN, CDCES Diabetes Coordinator Inpatient Glycemic Control Team Team Pager: [phone] (8a-5p)

## 2022-12-27 NOTE — Plan of Care (Signed)
Continuing with plan of care. 

## 2022-12-27 NOTE — CV Procedure (Signed)
Brief STEMI Note  Patient brought in by EMS with ST elevation inferiorly patient been having chest pain on and off since 6 PM  Presented to emergency room hemodynamically stable got nitro via EMS chest pain was 12/10 brought down to about a 6 given Nitropaste heparin aspirin in the emergency room and then on my direction given 180 mg of Brilinta.  Patient brought to Cath Lab for emergent cath and intervention  Patient was found to have 99% mid RCA thrombotic with TIMI II flow proximal RCA stent was widely patent right dominant system.  Left system free of disease  Left ventricular function mildly depressed with inferior basal hypokinesis EF of around 45 to 50%  Intervention PCI stent DES to mid RCA 3.0 x 22 frontier Onyx up to 18 atm lesion reduced from 99 down to 0% TIMI-3 flow restored from TIMI II  Patient maintained on aspirin Brilinta and Angiomax for an additional 2 hours at reduced rate transferred to ICU  Sheath removed and Mynx deployed  No complication patient tolerated procedure well hospitalist contacted for medical management  Full cath note and consult note to follow  [facility]

## 2022-12-27 NOTE — Telephone Encounter (Signed)
Pharmacy Patient Advocate Encounter   Received notification that prior authorization for FreeStyle Libre 3 Sensoris required/requested.   Insurance verification completed.   The patient is insured through [facility] .   Per test claim: PA required; PA started via CoverMyMeds. KEY [id number] . Waiting for clinical questions to populate.

## 2022-12-27 NOTE — Assessment & Plan Note (Signed)
-   The patient be placed on frequent fingerstick blood glucose measures with high resistant NovoLog coverage. - She we will be kept n.p.o. till improvement of her blood glucose levels. - Will continue Jardiance.

## 2022-12-27 NOTE — Plan of Care (Signed)

## 2022-12-27 NOTE — Progress Notes (Signed)
eLink Physician-Brief Progress Note Patient Name: Caitlyn Roth DOB: [birth year] MRN: [id number]   Date of Service  12/27/2022  HPI/Events of Note  57 year old female brought into the emergency room with acute inferior STEMI found to have a 99% mid RCA lesion with TIMI II flow after PCI and DES to the RCA.  Admitted to the ICU for post monitoring.  On presentation, vital signs are normal.  Saturation 97% on 2 L of oxygen.  Initial laboratory studies consistent with hyperglycemia into the 400s-500s.  Minimal troponin elevation 26 and elevated lipid panel.  Chest radiograph unremarkable.  Inferior STEMI on EKG.  eICU Interventions  Maintain dual antiplatelet therapy with aspirin and ticagrelor.  Initiated rosuvastatin  Goal-directed medical therapy with metoprolol and lisinopril.    Started sliding scale insulin, will will likely need to initiate long-acting insulin.  DVT prophylaxis is SCDs  GI prophylaxis not indicated.     Intervention Category Evaluation Type: New Patient Evaluation  Caitlyn Roth 12/27/2022, 1:41 AM

## 2022-12-27 NOTE — Assessment & Plan Note (Signed)
Continue PPI therapy. 

## 2022-12-27 NOTE — TOC Initial Note (Signed)
Transition of Care [facility]) - Initial/Assessment Note    Patient Details  Name: Caitlyn Roth MRN: [id number] Date of Birth: 1965/07/18  Transition of Care [facility]) CM/SW Contact:    Kreg Shropshire, RN Phone Number: 12/27/2022, 10:13 AM  Clinical Narrative:                 CM received TOC consult for Medication Assistance. Pt stated that she gets all her medications at the pharmacy with  no issues.  Pt arrived from ED from: Home Caregiver Support: Mother and sister DME at Home: None Transportation: family Previous Services: none HH/SNF Preference: First Person of Contact: PCP: Dr. Judd Lien  Cm will continue to follow for toc needs and d/c planning.         Patient Goals and CMS Choice   CMS Medicare.gov Compare Post Acute Care list provided to:: Patient Choice offered to / list presented to : Patient      Expected Discharge Plan and Services     Post Acute Care Choice: Skilled Nursing Facility, Home Health Living arrangements for the past 2 months: Single Family Home                                      Prior Living Arrangements/Services Living arrangements for the past 2 months: Single Family Home Lives with:: Self                   Activities of Daily Living Home Assistive Devices/Equipment: Grab bars in shower, Eyeglasses, Dentures (specify type), Transfer board, Shower chair with back (partial on bottom) ADL Screening (condition at time of admission) Patient's cognitive ability adequate to safely complete daily activities?: No Is the patient deaf or have difficulty hearing?: No Does the patient have difficulty seeing, even when wearing glasses/contacts?: Yes (R eye corneal transplant, far sighted, reading glasses) Does the patient have difficulty concentrating, remembering, or making decisions?: Yes Patient able to express need for assistance with ADLs?: Yes Does the patient have difficulty dressing or bathing?: No Independently performs ADLs?: Yes  (appropriate for developmental age) Does the patient have difficulty walking or climbing stairs?: No Weakness of Legs: None Weakness of Arms/Hands: None  Permission Sought/Granted                  Emotional Assessment Appearance:: Appears stated age   Affect (typically observed): Calm Orientation: : Oriented to Self, Oriented to Place, Oriented to  Time, Oriented to Situation      Admission diagnosis:  ST elevation myocardial infarction (STEMI), unspecified artery (HCC) [I21.3] STEMI involving right coronary artery (HCC) [I21.11] Acute ST elevation myocardial infarction (STEMI) of inferior wall (HCC) [I21.19] Patient Active Problem List   Diagnosis Date Noted   STEMI involving right coronary artery (HCC) 12/27/2022   Acute ST elevation myocardial infarction (STEMI) of inferior wall (HCC) 12/27/2022   Uncontrolled type 2 diabetes mellitus with hyperglycemia, without long-term current use of insulin (HCC) 12/27/2022   Anxiety and depression 12/27/2022   GERD without esophagitis 12/27/2022   Hyponatremia 12/27/2022   Mixed hyperlipidemia 01/29/2017   Vitamin D deficiency 01/29/2017   Biceps tendinitis of right upper extremity 08/02/2016   Complete tear of right rotator cuff 08/02/2016   Arteriosclerotic coronary artery disease 02/07/2015   Cerebral infarction (HCC) 11/03/2014   Eczema 11/03/2014   Hypertension 11/03/2014   Hyperthyroidism 11/03/2014   Migraine 11/03/2014   Hypercholesterolemia 11/11/2007   Allergic  rhinitis 08/12/2007   Barrett's esophagus without dysplasia 08/12/2007   Gastro-esophageal reflux disease without esophagitis 08/12/2007   PCP:  Patrice Paradise, MD Pharmacy:   [id number] - [place], [place] - [address] [place] [address] Phone: [phone] Fax: [phone]  [id number] [place], [place] - [address] AT [facility] OF [address] [place]  [address] Phone: [phone] Fax: [phone]  [facility] [address] [place] [address] Phone: [phone] Fax: [phone]     Social Determinants of Health (SDOH) Social History: SDOH Screenings   Food Insecurity: No Food Insecurity (12/27/2022)  Housing: Patient Declined (12/27/2022)  Transportation Needs: No Transportation Needs (12/27/2022)  Utilities: Not At Risk (12/27/2022)  Tobacco Use: High Risk (12/26/2022)   SDOH Interventions:     Readmission Risk Interventions     No data to display

## 2022-12-27 NOTE — Progress Notes (Signed)
Initial Nutrition Assessment  DOCUMENTATION CODES:   Not applicable  INTERVENTION:   Ensure Max protein supplement po BID, each supplement provides 150kcal and 30g of protein.  MVI po daily   Pt high refeed risk; recommend monitor potassium, magnesium and phosphorus labs daily until stable  Carbohydrate modified/heart healthy diet   Daily weights   NUTRITION DIAGNOSIS:   Inadequate oral intake related to poor appetite as evidenced by 16 percent weight loss in one year.  GOAL:   Patient will meet greater than or equal to 90% of their needs  MONITOR:   PO intake, Supplement acceptance, Labs, Weight trends, I & O's, Skin  REASON FOR ASSESSMENT:   Malnutrition Screening Tool    ASSESSMENT:   57 y/o female with h/o Afib, ADHD, PTSD, anxiety, depression, CAD, GERD, OSA, Barrett's esophagus, hyperthyroidism, Grave's disease, HLD, HTN, stroke, DM and B12 deficiency who is admitted with STEMI status post DES placement 8/8.  Met with pt in room today. Pt reports decreased appetite and oral intake over the past year secondary to grief from the passing of her husband in June 2023. Pt reports that she has lost~60-70lbs over the past 14 months. Per chart, pt is down 31lbs(16%) over the past year and 11lbs(6%) over the past 5 months; this is not significant given the time frame. Pt reports that she mainly tries to follow a vegetarian diet but is not strict. Pt does consume protein bites and bars at home. Pt reports that she ate some grits for breakfast this morning but reports that she does not like the hospital food; pt is asking for butter with meal trays. RD discussed with pt the importance of adequate nutrition needed to preserve lean muscle. Pt is willing to drink chocolate supplements in hospital. Pt with hyperglycemia and uncontrolled DM; RD will change pt to a carbohydrate controlled diet and add heart heathy restrictions in [facility]. Pt noted to have a milk allergy; pt denies any  milk allergy and reports that she eats foods containing milk at home. RD will remove this allergy. Pt is likely at refeed risk.    Medications reviewed and include: aspirin, B12, lovenox, insulin, tapazole, protonix, brilinta, NaCl @100ml /hr  Labs reviewed: Na 129(L), K 4.0 wnl Hct 34.9(L) Cbgs- 189, 246, 289, 434 x 24 hrs  AIC 13.6(H)- 8/9  NUTRITION - FOCUSED PHYSICAL EXAM:  Flowsheet Row Most Recent Value  Orbital Region No depletion  Upper Arm Region No depletion  Thoracic and Lumbar Region No depletion  Buccal Region No depletion  Temple Region Mild depletion  Clavicle Bone Region Moderate depletion  Clavicle and Acromion Bone Region Moderate depletion  Scapular Bone Region No depletion  Dorsal Hand Mild depletion  Patellar Region Moderate depletion  Anterior Thigh Region Moderate depletion  Posterior Calf Region Moderate depletion  Edema (RD Assessment) None  Hair Reviewed  Eyes Reviewed  Mouth Reviewed  Skin Reviewed  Nails Reviewed   Diet Order:   Diet Order             Diet Heart Room service appropriate? Yes; Fluid consistency: Thin  Diet effective now                  EDUCATION NEEDS:   Education needs have been addressed  Skin:  Skin Assessment: Reviewed RN Assessment  Last BM:  8/8  Height:   Ht Readings from Last 1 Encounters:  12/27/22 5\' 9"  (1.753 m)    Weight:   Wt Readings from Last 1 Encounters:  12/27/22 74.1 kg    Ideal Body Weight:  72.7 kg  BMI:  Body mass index is 24.12 kg/m.  Estimated Nutritional Needs:   Kcal:  1700-2000kcal/day  Protein:  85-100g/day  Fluid:  1.8-2.1L/day  Betsey Holiday MS, RD, LDN Please refer to [facility] for RD and/or RD on-call/weekend/after hours pager

## 2022-12-27 NOTE — Progress Notes (Signed)
Progress Note   Patient: Caitlyn Roth [id number]:[id number] DOB: [birth year] DOA: 12/26/2022     0 DOS: the patient was seen and examined on 12/27/2022   Brief hospital course: Patient was admitted last night with STEMI and found to have obstructive CAD in RCA, status post DES placement and is currently being monitored in ICU.  Assessment and Plan: STEMI status post DES placement in right coronary artery last night Transfer the patient to stepdown unit Echocardiogram is pending Stop checking troponins Continue aspirin and Brilinta, Lipitor 80 Mg, Toprol-XL 50 Mg Continue lisinopril 5 Mg once daily sublingual nitroglycerin IV morphine sulfate for pain. Continuous cardiac monitoring  Type 2 diabetes mellitus with hyperglycemia: Blood sugars are moderately controlled, better from last night Schedule Lantus 5 units at bedtime Sliding scale insulin as needed, follow fingersticks Continue Jardiance.  Hyperthyroidism: TSH pending Continue Tapazole.  Hyponatremia: 129 this a.m. likely pseudohyponatremia due to hyperglycemia Continue IV normal saline and will follow BMP.  GERD without esophagitis: Continue PPI therapy.  Anxiety and depression: Continue BuSpar and Effexor XR.   Subjective: Patient denies any chest pain, chest pressure, shortness of breath, cough, fever, chills, nausea, abdominal pain, diarrhea/dysuria.  Physical Exam: Vitals:   12/27/22 0700 12/27/22 0800 12/27/22 0805 12/27/22 0900  BP: 129/83 128/78  112/75  Pulse: 62 62 62 66  Resp: (!) 7 14 13 18   Temp:  (!) 97.5 F (36.4 C)    TempSrc:  Oral    SpO2: 99% 98% 98% 99%  Weight:      Height:       Physical Exam Constitutional:      General: She is not in acute distress.    Appearance: Normal appearance. She is normal weight. She is not ill-appearing.  HENT:     Head: Normocephalic and atraumatic.     Nose: Nose normal. No congestion.     Mouth/Throat:     Mouth: Mucous membranes are dry.     Pharynx:  Oropharynx is clear. No oropharyngeal exudate.  Eyes:     Extraocular Movements: Extraocular movements intact.     Conjunctiva/sclera: Conjunctivae normal.     Pupils: Pupils are equal, round, and reactive to light.  Cardiovascular:     Rate and Rhythm: Normal rate and regular rhythm.     Pulses: Normal pulses.     Heart sounds: Normal heart sounds. No murmur heard. Pulmonary:     Effort: Pulmonary effort is normal. No respiratory distress.     Breath sounds: Normal breath sounds. No wheezing or rales.  Abdominal:     General: Abdomen is flat. Bowel sounds are normal.     Palpations: Abdomen is soft.  Musculoskeletal:        General: No swelling or tenderness. Normal range of motion.     Cervical back: Normal range of motion and neck supple.     Right lower leg: No edema.     Left lower leg: No edema.  Skin:    General: Skin is warm and dry.     Capillary Refill: Capillary refill takes 2 to 3 seconds.     Findings: No erythema or rash.  Neurological:     General: No focal deficit present.     Mental Status: She is alert and oriented to person, place, and time.     Cranial Nerves: No cranial nerve deficit.     Sensory: No sensory deficit.     Motor: No weakness.  Psychiatric:  Mood and Affect: Mood normal.        Behavior: Behavior normal.        Thought Content: Thought content normal.     Data Reviewed:   Latest Reference Range & Units 12/26/22 23:54  COMPREHENSIVE METABOLIC PANEL  Rpt !!  Sodium 135 - 145 mmol/L 131 (L)  Potassium 3.5 - 5.1 mmol/L 4.0  Chloride 98 - 111 mmol/L 95 (L)  CO2 22 - 32 mmol/L 26  Glucose 70 - 99 mg/dL 562 (HH)  BUN 6 - 20 mg/dL 11  Creatinine 1.30 - 8.65 mg/dL 7.84  Calcium 8.9 - 69.6 mg/dL 9.1  Anion gap 5 - 15  10  Alkaline Phosphatase 38 - 126 U/L 121  Albumin 3.5 - 5.0 g/dL 3.9  AST 15 - 41 U/L 16  ALT 0 - 44 U/L 20  Total Protein 6.5 - 8.1 g/dL 7.0  Total Bilirubin 0.3 - 1.2 mg/dL 0.9  GFR, Estimated >29 mL/min >60   Troponin I (High Sensitivity) <18 ng/L 26 (H)  Total CHOL/HDL Ratio RATIO 8.8  Cholesterol 0 - 200 mg/dL 528 (H)  HDL Cholesterol >40 mg/dL 27 (L)  LDL (calc) 0 - 99 mg/dL 413 (H)  Triglycerides <150 mg/dL 244 (H)  VLDL 0 - 40 mg/dL 78 (H)  WBC 4.0 - 01.0 K/uL 10.7 (H)  RBC 3.87 - 5.11 MIL/uL 4.58  Hemoglobin 12.0 - 15.0 g/dL 27.2  HCT 53.6 - 64.4 % 40.9  MCV 80.0 - 100.0 fL 89.3  MCH 26.0 - 34.0 pg 31.0  MCHC 30.0 - 36.0 g/dL 03.4  RDW 74.2 - 59.5 % 12.3  Platelets 150 - 400 K/uL 293  nRBC 0.0 - 0.2 % 0.0  Neutrophils % 62  Lymphocytes % 27  Monocytes Relative % 6  Eosinophil % 3  Basophil % 1  Immature Granulocytes % 1  NEUT# 1.7 - 7.7 K/uL 6.7  Lymphocyte # 0.7 - 4.0 K/uL 2.9  Monocyte # 0.1 - 1.0 K/uL 0.7  Eosinophils Absolute 0.0 - 0.5 K/uL 0.3  Basophils Absolute 0.0 - 0.1 K/uL 0.1  Abs Immature Granulocytes 0.00 - 0.07 K/uL 0.05  Prothrombin Time 11.4 - 15.2 seconds 12.8  INR 0.8 - 1.2  0.9  APTT 24 - 36 seconds 25  !!: Data is critical (HH): Data is critically high (L): Data is abnormally low (H): Data is abnormally high Rpt: View report in Results Review for more information  Family Communication: Updated patient and family at bedside  Disposition: Status is: Change from ICU to stepdown status    Planned Discharge Destination: Home possibly tomorrow  Time spent: 35 minutes  Author: Ernestene Mention, MD 12/27/2022 9:36 AM  For on call review [email or web address].

## 2022-12-27 NOTE — Assessment & Plan Note (Signed)
-   This is likely pseudohyponatremia due to hyperglycemia - She will be hydrated with IV normal saline and will follow BMP.

## 2022-12-27 NOTE — Progress Notes (Signed)
*  PRELIMINARY RESULTS* Echocardiogram 2D Echocardiogram has been performed.  Caitlyn Roth 12/27/2022, 3:38 PM

## 2022-12-27 NOTE — Telephone Encounter (Signed)
Clinical Notes have been submitted

## 2022-12-27 NOTE — TOC Benefit Eligibility Note (Signed)
Patient Product/process development scientist completed.    The patient is insured through [facility]. Patient has [employer], may use a copay card, and/or apply for patient assistance if available.    Ran test claim for Dexcom G7 Sensor and Requires Prior Authorization  Ran test claim for [employer] 3 Sensor and Requires Prior Authorization  This test claim was processed through [employer]- copay amounts may vary at other pharmacies due to [employer], or as the patient moves through the different stages of their insurance plan.     Roland Earl, CPHT Pharmacy Patient Advocate Specialist [facility] Patient Advocate Team Direct Number: [phone]  Fax: [phone]

## 2022-12-28 LAB — GLUCOSE, CAPILLARY
Glucose-Capillary: 131 mg/dL — ABNORMAL HIGH (ref 70–99)
Glucose-Capillary: 105 mg/dL — ABNORMAL HIGH (ref 70–99)
Glucose-Capillary: 212 mg/dL — ABNORMAL HIGH (ref 70–99)

## 2022-12-28 MED ORDER — METOPROLOL SUCCINATE ER 50 MG PO TB24
50.0000 mg | ORAL_TABLET | Freq: Every day | ORAL | 1 refills | Status: DC
  Filled 2022-12-28: qty 30, 30d supply, fill #0

## 2022-12-28 MED ORDER — POTASSIUM CHLORIDE 10 MEQ/100ML IV SOLN
10.0000 meq | INTRAVENOUS | Status: AC
  Administered 2022-12-28 (×2): 10 meq via INTRAVENOUS
  Filled 2022-12-28 (×2): qty 100

## 2022-12-28 MED ORDER — METFORMIN HCL ER 500 MG PO TB24
500.0000 mg | ORAL_TABLET | Freq: Every day | ORAL | 1 refills | Status: DC
  Filled 2022-12-28: qty 30, 30d supply, fill #0

## 2022-12-28 MED ORDER — MAGNESIUM OXIDE -MG SUPPLEMENT 400 (240 MG) MG PO TABS
400.0000 mg | ORAL_TABLET | Freq: Every day | ORAL | Status: DC
  Administered 2022-12-28: 400 mg via ORAL
  Filled 2022-12-28: qty 1

## 2022-12-28 MED ORDER — LISINOPRIL 5 MG PO TABS
5.0000 mg | ORAL_TABLET | Freq: Every day | ORAL | 1 refills | Status: DC
  Filled 2022-12-28: qty 30, 30d supply, fill #0

## 2022-12-28 MED ORDER — POTASSIUM CHLORIDE CRYS ER 20 MEQ PO TBCR
40.0000 meq | EXTENDED_RELEASE_TABLET | Freq: Once | ORAL | Status: AC
  Administered 2022-12-28: 40 meq via ORAL
  Filled 2022-12-28: qty 2

## 2022-12-28 NOTE — Discharge Summary (Addendum)
Physician Discharge Summary   Patient: Caitlyn Roth MRN: [id number] DOB: 10/27/1965  Admit date:     12/26/2022  Discharge date: 12/28/22  Discharge Physician: Ernestene Mention   PCP: Patrice Paradise, MD   Recommendations at discharge:  Follow up with cardiology clinic and PCP post discharge  Discharge Diagnoses: Active Problems:   Uncontrolled type 2 diabetes mellitus with hyperglycemia, without long-term current use of insulin (HCC)   Hyperthyroidism   Anxiety and depression   GERD without esophagitis  Principal Problem (Resolved):   STEMI involving right coronary artery (HCC) Resolved Problems:   Hyponatremia   Acute ST elevation myocardial infarction (STEMI) of inferior wall [facility])  Hospital Course: Patient was admitted with STEMI and 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 STEMI status post DES placement in right coronary artery Echocardiogram 12/27/22: Left ventricular ejection fraction, by estimation, is 50 to 55%. The  left ventricle has low normal function. The left ventricle demonstrates  regional wall motion abnormalities. Left ventricular diastolic parameters were normal. Right ventricular systolic function is normal. The right ventricular size is mildly enlarged. Mildly increased right ventricular wall thickness. The mitral valve is grossly normal. Trivial mitral valve  regurgitation. The aortic valve is normal in structure. Continue aspirin and Brilinta, Lipitor 80 Mg, Toprol-XL 50 Mg Continue lisinopril 5 Mg once daily   Type 2 diabetes mellitus with hyperglycemia: HbA1c 13.6 Discharge with Metformin XR 500mg  once daily Continue Jardiance.   Hyperthyroidism: Continue Tapazole.   Hyponatremia: resolved likely pseudohyponatremia due to hyperglycemia follow BMP at PCP visit   GERD without esophagitis: Continue PPI therapy.   Anxiety and depression: Continue BuSpar and Effexor XR.       Consultants:  Procedures performed:  12/27/22 Patient brought in by EMS with ST elevation inferiorly patient been having chest pain on and off since 6 PM   Presented to emergency room hemodynamically stable got nitro via EMS chest pain was 12/10 brought down to about a 6 given Nitropaste heparin aspirin in the emergency room and then on my direction given 180 mg of Brilinta.   Patient brought to Cath Lab for emergent cath and intervention   Patient was found to have 99% mid RCA thrombotic with TIMI II flow proximal RCA stent was widely patent right dominant system.   Left system free of disease   Left ventricular function mildly depressed with inferior basal hypokinesis EF of around 45 to 50%   Intervention PCI stent DES to mid RCA 3.0 x 22 frontier Onyx up to 18 atm lesion reduced from 99 down to 0% TIMI-3 flow restored from TIMI II   Patient maintained on aspirin Brilinta and Angiomax for an additional 2 hours at reduced rate transferred to ICU   Sheath removed and Mynx deployed   No complication patient tolerated procedure well hospitalist contacted for medical management   Full cath note and consult note to follow   [place]   Disposition: Home Diet recommendation:  Cardiac diet DISCHARGE MEDICATION: Allergies as of 12/28/2022       Reactions   Cefdinir Nausea And Vomiting, Other (See Comments)   Swelling of throat.   Prednisone    Other reaction(s): Other (See Comments) LOST 40% OF VISION IN ONE EYE Patient reports she is allergic to "all steroids"   Acyclovir Nausea And Vomiting   Other reaction(s): Unknown   Codeine Nausea Only   Other Nausea Only   Antibiotic (oral) patient  medical management Anticipate discharge 48 to 72 hours   ECHOCARDIOGRAM COMPLETE  Result Date: 12/27/2022    ECHOCARDIOGRAM REPORT   Patient Name:   Caitlyn Roth Date of Exam: 12/27/2022 Medical Rec #:  [id number]      Height:       69.0 in Accession #:    [id number]     Weight:       163.4 lb Date of Birth:  12-30-1965      BSA:          1.896 m Patient Age:    57 years       BP:           112/75 mmHg Patient Gender: F              HR:           60 bpm. Exam Location:  [facility] Procedure: 2D Echo, 3D Echo, Cardiac Doppler, Color Doppler and Strain Analysis Indications:     Acute MI  History:         Patient has no prior history of Echocardiogram examinations.                  Acute MI and CAD; Risk Factors:Hypertension, Diabetes and                  Dyslipidemia.  Sonographer:     Mikki Harbor Referring Phys:  [id number] DWAYNE D CALLWOOD Diagnosing Phys: Alwyn Pea MD  Sonographer Comments: Global longitudinal strain was attempted. IMPRESSIONS  1. Left ventricular ejection fraction, by estimation, is 50 to 55%. The left ventricle has low normal function. The left ventricle demonstrates regional wall motion abnormalities (see scoring diagram/findings for description). Left ventricular diastolic  parameters were normal.  2. Right ventricular systolic function is normal. The right ventricular size is mildly enlarged. Mildly increased right ventricular wall thickness.  3. The mitral valve is grossly normal. Trivial mitral valve regurgitation.  4. The aortic valve is normal in structure. Aortic valve regurgitation is not visualized. FINDINGS  Left Ventricle: Mild inferior hypo. Left ventricular ejection fraction, by estimation, is 50 to 55%. The left ventricle has low normal function.  The left ventricle demonstrates regional wall motion abnormalities. The left ventricular internal cavity size was normal in size. There is borderline concentric left ventricular hypertrophy. Left ventricular diastolic parameters were normal. Right Ventricle: The right ventricular size is mildly enlarged. Mildly increased right ventricular wall thickness. Right ventricular systolic function is normal. Left Atrium: Left atrial size was normal in size. Right Atrium: Right atrial size was normal in size. Pericardium: There is no evidence of pericardial effusion. Mitral Valve: The mitral valve is grossly normal. Trivial mitral valve regurgitation. MV peak gradient, 3.1 mmHg. The mean mitral valve gradient is 1.0 mmHg. Tricuspid Valve: The tricuspid valve is normal in structure. Tricuspid valve regurgitation is trivial. Aortic Valve: The aortic valve is normal in structure. Aortic valve regurgitation is not visualized. Aortic valve mean gradient measures 5.0 mmHg. Aortic valve peak gradient measures 8.9 mmHg. Aortic valve area, by VTI measures 2.29 cm. Pulmonic Valve: The pulmonic valve was normal in structure. Pulmonic valve regurgitation is not visualized. Aorta: The ascending aorta was not well visualized. IAS/Shunts: No atrial level shunt detected by color flow Doppler.  LEFT VENTRICLE PLAX 2D LVIDd:         4.60 cm     Diastology LVIDs:         3.10 cm     LV e'  medical management Anticipate discharge 48 to 72 hours   ECHOCARDIOGRAM COMPLETE  Result Date: 12/27/2022    ECHOCARDIOGRAM REPORT   Patient Name:   Caitlyn Roth Date of Exam: 12/27/2022 Medical Rec #:  [id number]      Height:       69.0 in Accession #:    [id number]     Weight:       163.4 lb Date of Birth:  12-30-1965      BSA:          1.896 m Patient Age:    57 years       BP:           112/75 mmHg Patient Gender: F              HR:           60 bpm. Exam Location:  [facility] Procedure: 2D Echo, 3D Echo, Cardiac Doppler, Color Doppler and Strain Analysis Indications:     Acute MI  History:         Patient has no prior history of Echocardiogram examinations.                  Acute MI and CAD; Risk Factors:Hypertension, Diabetes and                  Dyslipidemia.  Sonographer:     Mikki Harbor Referring Phys:  [id number] DWAYNE D CALLWOOD Diagnosing Phys: Alwyn Pea MD  Sonographer Comments: Global longitudinal strain was attempted. IMPRESSIONS  1. Left ventricular ejection fraction, by estimation, is 50 to 55%. The left ventricle has low normal function. The left ventricle demonstrates regional wall motion abnormalities (see scoring diagram/findings for description). Left ventricular diastolic  parameters were normal.  2. Right ventricular systolic function is normal. The right ventricular size is mildly enlarged. Mildly increased right ventricular wall thickness.  3. The mitral valve is grossly normal. Trivial mitral valve regurgitation.  4. The aortic valve is normal in structure. Aortic valve regurgitation is not visualized. FINDINGS  Left Ventricle: Mild inferior hypo. Left ventricular ejection fraction, by estimation, is 50 to 55%. The left ventricle has low normal function.  The left ventricle demonstrates regional wall motion abnormalities. The left ventricular internal cavity size was normal in size. There is borderline concentric left ventricular hypertrophy. Left ventricular diastolic parameters were normal. Right Ventricle: The right ventricular size is mildly enlarged. Mildly increased right ventricular wall thickness. Right ventricular systolic function is normal. Left Atrium: Left atrial size was normal in size. Right Atrium: Right atrial size was normal in size. Pericardium: There is no evidence of pericardial effusion. Mitral Valve: The mitral valve is grossly normal. Trivial mitral valve regurgitation. MV peak gradient, 3.1 mmHg. The mean mitral valve gradient is 1.0 mmHg. Tricuspid Valve: The tricuspid valve is normal in structure. Tricuspid valve regurgitation is trivial. Aortic Valve: The aortic valve is normal in structure. Aortic valve regurgitation is not visualized. Aortic valve mean gradient measures 5.0 mmHg. Aortic valve peak gradient measures 8.9 mmHg. Aortic valve area, by VTI measures 2.29 cm. Pulmonic Valve: The pulmonic valve was normal in structure. Pulmonic valve regurgitation is not visualized. Aorta: The ascending aorta was not well visualized. IAS/Shunts: No atrial level shunt detected by color flow Doppler.  LEFT VENTRICLE PLAX 2D LVIDd:         4.60 cm     Diastology LVIDs:         3.10 cm     LV e'  medical management Anticipate discharge 48 to 72 hours   ECHOCARDIOGRAM COMPLETE  Result Date: 12/27/2022    ECHOCARDIOGRAM REPORT   Patient Name:   Caitlyn Roth Date of Exam: 12/27/2022 Medical Rec #:  [id number]      Height:       69.0 in Accession #:    [id number]     Weight:       163.4 lb Date of Birth:  12-30-1965      BSA:          1.896 m Patient Age:    57 years       BP:           112/75 mmHg Patient Gender: F              HR:           60 bpm. Exam Location:  [facility] Procedure: 2D Echo, 3D Echo, Cardiac Doppler, Color Doppler and Strain Analysis Indications:     Acute MI  History:         Patient has no prior history of Echocardiogram examinations.                  Acute MI and CAD; Risk Factors:Hypertension, Diabetes and                  Dyslipidemia.  Sonographer:     Mikki Harbor Referring Phys:  [id number] DWAYNE D CALLWOOD Diagnosing Phys: Alwyn Pea MD  Sonographer Comments: Global longitudinal strain was attempted. IMPRESSIONS  1. Left ventricular ejection fraction, by estimation, is 50 to 55%. The left ventricle has low normal function. The left ventricle demonstrates regional wall motion abnormalities (see scoring diagram/findings for description). Left ventricular diastolic  parameters were normal.  2. Right ventricular systolic function is normal. The right ventricular size is mildly enlarged. Mildly increased right ventricular wall thickness.  3. The mitral valve is grossly normal. Trivial mitral valve regurgitation.  4. The aortic valve is normal in structure. Aortic valve regurgitation is not visualized. FINDINGS  Left Ventricle: Mild inferior hypo. Left ventricular ejection fraction, by estimation, is 50 to 55%. The left ventricle has low normal function.  The left ventricle demonstrates regional wall motion abnormalities. The left ventricular internal cavity size was normal in size. There is borderline concentric left ventricular hypertrophy. Left ventricular diastolic parameters were normal. Right Ventricle: The right ventricular size is mildly enlarged. Mildly increased right ventricular wall thickness. Right ventricular systolic function is normal. Left Atrium: Left atrial size was normal in size. Right Atrium: Right atrial size was normal in size. Pericardium: There is no evidence of pericardial effusion. Mitral Valve: The mitral valve is grossly normal. Trivial mitral valve regurgitation. MV peak gradient, 3.1 mmHg. The mean mitral valve gradient is 1.0 mmHg. Tricuspid Valve: The tricuspid valve is normal in structure. Tricuspid valve regurgitation is trivial. Aortic Valve: The aortic valve is normal in structure. Aortic valve regurgitation is not visualized. Aortic valve mean gradient measures 5.0 mmHg. Aortic valve peak gradient measures 8.9 mmHg. Aortic valve area, by VTI measures 2.29 cm. Pulmonic Valve: The pulmonic valve was normal in structure. Pulmonic valve regurgitation is not visualized. Aorta: The ascending aorta was not well visualized. IAS/Shunts: No atrial level shunt detected by color flow Doppler.  LEFT VENTRICLE PLAX 2D LVIDd:         4.60 cm     Diastology LVIDs:         3.10 cm     LV e'  medical management Anticipate discharge 48 to 72 hours   ECHOCARDIOGRAM COMPLETE  Result Date: 12/27/2022    ECHOCARDIOGRAM REPORT   Patient Name:   Caitlyn Roth Date of Exam: 12/27/2022 Medical Rec #:  [id number]      Height:       69.0 in Accession #:    [id number]     Weight:       163.4 lb Date of Birth:  12-30-1965      BSA:          1.896 m Patient Age:    57 years       BP:           112/75 mmHg Patient Gender: F              HR:           60 bpm. Exam Location:  [facility] Procedure: 2D Echo, 3D Echo, Cardiac Doppler, Color Doppler and Strain Analysis Indications:     Acute MI  History:         Patient has no prior history of Echocardiogram examinations.                  Acute MI and CAD; Risk Factors:Hypertension, Diabetes and                  Dyslipidemia.  Sonographer:     Mikki Harbor Referring Phys:  [id number] DWAYNE D CALLWOOD Diagnosing Phys: Alwyn Pea MD  Sonographer Comments: Global longitudinal strain was attempted. IMPRESSIONS  1. Left ventricular ejection fraction, by estimation, is 50 to 55%. The left ventricle has low normal function. The left ventricle demonstrates regional wall motion abnormalities (see scoring diagram/findings for description). Left ventricular diastolic  parameters were normal.  2. Right ventricular systolic function is normal. The right ventricular size is mildly enlarged. Mildly increased right ventricular wall thickness.  3. The mitral valve is grossly normal. Trivial mitral valve regurgitation.  4. The aortic valve is normal in structure. Aortic valve regurgitation is not visualized. FINDINGS  Left Ventricle: Mild inferior hypo. Left ventricular ejection fraction, by estimation, is 50 to 55%. The left ventricle has low normal function.  The left ventricle demonstrates regional wall motion abnormalities. The left ventricular internal cavity size was normal in size. There is borderline concentric left ventricular hypertrophy. Left ventricular diastolic parameters were normal. Right Ventricle: The right ventricular size is mildly enlarged. Mildly increased right ventricular wall thickness. Right ventricular systolic function is normal. Left Atrium: Left atrial size was normal in size. Right Atrium: Right atrial size was normal in size. Pericardium: There is no evidence of pericardial effusion. Mitral Valve: The mitral valve is grossly normal. Trivial mitral valve regurgitation. MV peak gradient, 3.1 mmHg. The mean mitral valve gradient is 1.0 mmHg. Tricuspid Valve: The tricuspid valve is normal in structure. Tricuspid valve regurgitation is trivial. Aortic Valve: The aortic valve is normal in structure. Aortic valve regurgitation is not visualized. Aortic valve mean gradient measures 5.0 mmHg. Aortic valve peak gradient measures 8.9 mmHg. Aortic valve area, by VTI measures 2.29 cm. Pulmonic Valve: The pulmonic valve was normal in structure. Pulmonic valve regurgitation is not visualized. Aorta: The ascending aorta was not well visualized. IAS/Shunts: No atrial level shunt detected by color flow Doppler.  LEFT VENTRICLE PLAX 2D LVIDd:         4.60 cm     Diastology LVIDs:         3.10 cm     LV e'  Physician Discharge Summary   Patient: Caitlyn Roth MRN: [id number] DOB: 10/27/1965  Admit date:     12/26/2022  Discharge date: 12/28/22  Discharge Physician: Ernestene Mention   PCP: Patrice Paradise, MD   Recommendations at discharge:  Follow up with cardiology clinic and PCP post discharge  Discharge Diagnoses: Active Problems:   Uncontrolled type 2 diabetes mellitus with hyperglycemia, without long-term current use of insulin (HCC)   Hyperthyroidism   Anxiety and depression   GERD without esophagitis  Principal Problem (Resolved):   STEMI involving right coronary artery (HCC) Resolved Problems:   Hyponatremia   Acute ST elevation myocardial infarction (STEMI) of inferior wall [facility])  Hospital Course: Patient was admitted with STEMI and 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 STEMI status post DES placement in right coronary artery Echocardiogram 12/27/22: Left ventricular ejection fraction, by estimation, is 50 to 55%. The  left ventricle has low normal function. The left ventricle demonstrates  regional wall motion abnormalities. Left ventricular diastolic parameters were normal. Right ventricular systolic function is normal. The right ventricular size is mildly enlarged. Mildly increased right ventricular wall thickness. The mitral valve is grossly normal. Trivial mitral valve  regurgitation. The aortic valve is normal in structure. Continue aspirin and Brilinta, Lipitor 80 Mg, Toprol-XL 50 Mg Continue lisinopril 5 Mg once daily   Type 2 diabetes mellitus with hyperglycemia: HbA1c 13.6 Discharge with Metformin XR 500mg  once daily Continue Jardiance.   Hyperthyroidism: Continue Tapazole.   Hyponatremia: resolved likely pseudohyponatremia due to hyperglycemia follow BMP at PCP visit   GERD without esophagitis: Continue PPI therapy.   Anxiety and depression: Continue BuSpar and Effexor XR.       Consultants:  Procedures performed:  12/27/22 Patient brought in by EMS with ST elevation inferiorly patient been having chest pain on and off since 6 PM   Presented to emergency room hemodynamically stable got nitro via EMS chest pain was 12/10 brought down to about a 6 given Nitropaste heparin aspirin in the emergency room and then on my direction given 180 mg of Brilinta.   Patient brought to Cath Lab for emergent cath and intervention   Patient was found to have 99% mid RCA thrombotic with TIMI II flow proximal RCA stent was widely patent right dominant system.   Left system free of disease   Left ventricular function mildly depressed with inferior basal hypokinesis EF of around 45 to 50%   Intervention PCI stent DES to mid RCA 3.0 x 22 frontier Onyx up to 18 atm lesion reduced from 99 down to 0% TIMI-3 flow restored from TIMI II   Patient maintained on aspirin Brilinta and Angiomax for an additional 2 hours at reduced rate transferred to ICU   Sheath removed and Mynx deployed   No complication patient tolerated procedure well hospitalist contacted for medical management   Full cath note and consult note to follow   [place]   Disposition: Home Diet recommendation:  Cardiac diet DISCHARGE MEDICATION: Allergies as of 12/28/2022       Reactions   Cefdinir Nausea And Vomiting, Other (See Comments)   Swelling of throat.   Prednisone    Other reaction(s): Other (See Comments) LOST 40% OF VISION IN ONE EYE Patient reports she is allergic to "all steroids"   Acyclovir Nausea And Vomiting   Other reaction(s): Unknown   Codeine Nausea Only   Other Nausea Only   Antibiotic (oral) patient

## 2022-12-28 NOTE — Progress Notes (Signed)
Patient ID: Caitlyn Roth, female   DOB: 03-11-1966, 57 y.o.   MRN: [id number] [facility]    SUBJECTIVE: Patient states he feels much better much improved chest pain no palpitation no tachycardia no right groin issues patient feels well with ambulating in the room feels well enough to be discharged home and follow-up as an outpatient   Vitals:   12/28/22 0900 12/28/22 1000 12/28/22 1100 12/28/22 1128  BP:    118/80  Pulse: 61 67 64 69  Resp: 15     Temp:    98.3 F (36.8 C)  TempSrc:    Oral  SpO2: 100% 99% 97% 97%  Weight:      Height:         Intake/Output Summary (Last 24 hours) at 12/28/2022 1151 Last data filed at 12/28/2022 1100 Gross per 24 hour  Intake 502.73 ml  Output 1850 ml  Net -1347.27 ml      PHYSICAL EXAM  General: Well developed, well nourished, in no acute distress HEENT:  Normocephalic and atramatic Neck:  No JVD.  Lungs: Clear bilaterally to auscultation and percussion. Heart: HRRR . Normal S1 and S2 without gallops or murmurs.  Abdomen: Bowel sounds are positive, abdomen soft and non-tender  Msk:  Back normal, normal gait. Normal strength and tone for age. Extremities: No clubbing, cyanosis or edema.   Neuro: Alert and oriented X 3. Psych:  Good affect, responds appropriately   LABS: Basic Metabolic Panel: Recent Labs    12/27/22 1102 12/28/22 0426  NA 133* 136  K 3.5 3.0*  CL 102 103  CO2 23 24  GLUCOSE 214* 117*  BUN 9 9  CREATININE 0.62 0.53  CALCIUM 8.7* 9.1  MG  --  1.9  PHOS  --  4.5   Liver Function Tests: Recent Labs    12/26/22 2354  AST 16  ALT 20  ALKPHOS 121  BILITOT 0.9  PROT 7.0  ALBUMIN 3.9   No results for input(s): "LIPASE", "AMYLASE" in the last 72 hours. CBC: Recent Labs    12/26/22 2354 12/27/22 0200 12/27/22 1102 12/28/22 0426  WBC 10.7*   < > 7.5 8.9  NEUTROABS 6.7  --   --   --   HGB 14.2   < > 12.5 13.8  HCT 40.9   < > 34.9* 38.2  MCV 89.3   < > 86.8 87.6  PLT 293   < > 223 230   < > =  values in this interval not displayed.   Cardiac Enzymes: No results for input(s): "CKTOTAL", "CKMB", "CKMBINDEX", "TROPONINI" in the last 72 hours. BNP: Invalid input(s): "POCBNP" D-Dimer: No results for input(s): "DDIMER" in the last 72 hours. Hemoglobin A1C: Recent Labs    12/27/22 0202  HGBA1C 13.6*   Fasting Lipid Panel: Recent Labs    12/26/22 2354  CHOL 238*  HDL 27*  LDLCALC 133*  TRIG 388*  CHOLHDL 8.8   Thyroid Function Tests: No results for input(s): "TSH", "T4TOTAL", "T3FREE", "THYROIDAB" in the last 72 hours.  Invalid input(s): "FREET3" Anemia Panel: No results for input(s): "VITAMINB12", "FOLATE", "FERRITIN", "TIBC", "IRON", "RETICCTPCT" in the last 72 hours.  CARDIAC CATHETERIZATION  Result Date: 12/27/2022   Mid RCA lesion is 99% stenosed.   A drug-eluting stent was successfully placed using a STENT ONYX FRONTIER 3.0X22.   Post intervention, there is a 0% residual stenosis.   There is mild left ventricular systolic dysfunction.   LV end diastolic pressure is normal.   The  left ventricular ejection fraction is 45-50% by visual estimate.   As long as the patient continues to meet low risk STEMI criteria and in the absence of any other complications or medical issues, we expect the patient to be ready for discharge on 12/29/2022.   Recommend uninterrupted dual antiplatelet therapy with Aspirin 81mg  daily and Ticagrelor 90mg  twice daily for a minimum of 12 months (ACS-Class I recommendation). Conclusion STEMI presentation Diagnostic Left system relatively free of disease Large left main no significant disease Large LAD with a distal 75% lesion Circumflex large minor irregularities RCA large proximal previous stent widely patent mid RCA 99% thrombotic lesion IRA TIMI II flow right dominant system Intervention PCI and stent DES to mid RCA overlapping the distal part of the previous stent 3.0 x 22 mm frontier Onyx to 18 atm lesion reduced from 99 down to 0 TIMI-3 flow restored  Mynx deployed right femoral artery Patient maintained on aspirin Brilinta for at least 12 months Angiomax at reduced rate for an additional 2 hours Patient transferred to ICU hospitalist contacted for medical management Anticipate discharge 48 to 72 hours   ECHOCARDIOGRAM COMPLETE  Result Date: 12/27/2022    ECHOCARDIOGRAM REPORT   Patient Name:   Caitlyn Roth Date of Exam: 12/27/2022 Medical Rec #:  [id number]      Height:       69.0 in Accession #:    [id number]     Weight:       163.4 lb Date of Birth:  07/04/1965      BSA:          1.896 m Patient Age:    57 years       BP:           112/75 mmHg Patient Gender: F              HR:           60 bpm. Exam Location:  [facility] Procedure: 2D Echo, 3D Echo, Cardiac Doppler, Color Doppler and Strain Analysis Indications:     Acute MI  History:         Patient has no prior history of Echocardiogram examinations.                  Acute MI and CAD; Risk Factors:Hypertension, Diabetes and                  Dyslipidemia.  Sonographer:     Mikki Harbor Referring Phys:  [phone] Gerell Fortson D Kenni Newton Diagnosing Phys: Alwyn Pea MD  Sonographer Comments: Global longitudinal strain was attempted. IMPRESSIONS  1. Left ventricular ejection fraction, by estimation, is 50 to 55%. The left ventricle has low normal function. The left ventricle demonstrates regional wall motion abnormalities (see scoring diagram/findings for description). Left ventricular diastolic  parameters were normal.  2. Right ventricular systolic function is normal. The right ventricular size is mildly enlarged. Mildly increased right ventricular wall thickness.  3. The mitral valve is grossly normal. Trivial mitral valve regurgitation.  4. The aortic valve is normal in structure. Aortic valve regurgitation is not visualized. FINDINGS  Left Ventricle: Mild inferior hypo. Left ventricular ejection fraction, by estimation, is 50 to 55%. The left ventricle has low normal function. The left ventricle demonstrates  regional wall motion abnormalities. The left ventricular internal cavity size was normal in size. There is borderline concentric left ventricular hypertrophy. Left ventricular diastolic parameters were normal. Right Ventricle: The right ventricular size is mildly enlarged. Mildly increased  left ventricular ejection fraction is 45-50% by visual estimate.   As long as the patient continues to meet low risk STEMI criteria and in the absence of any other complications or medical issues, we expect the patient to be ready for discharge on 12/29/2022.   Recommend uninterrupted dual antiplatelet therapy with Aspirin 81mg  daily and Ticagrelor 90mg  twice daily for a minimum of 12 months (ACS-Class I recommendation). Conclusion STEMI presentation Diagnostic Left system relatively free of disease Large left main no significant disease Large LAD with a distal 75% lesion Circumflex large minor irregularities RCA large proximal previous stent widely patent mid RCA 99% thrombotic lesion IRA TIMI II flow right dominant system Intervention PCI and stent DES to mid RCA overlapping the distal part of the previous stent 3.0 x 22 mm frontier Onyx to 18 atm lesion reduced from 99 down to 0 TIMI-3 flow restored  Mynx deployed right femoral artery Patient maintained on aspirin Brilinta for at least 12 months Angiomax at reduced rate for an additional 2 hours Patient transferred to ICU hospitalist contacted for medical management Anticipate discharge 48 to 72 hours   ECHOCARDIOGRAM COMPLETE  Result Date: 12/27/2022    ECHOCARDIOGRAM REPORT   Patient Name:   Caitlyn Roth Date of Exam: 12/27/2022 Medical Rec #:  [id number]      Height:       69.0 in Accession #:    [id number]     Weight:       163.4 lb Date of Birth:  07/04/1965      BSA:          1.896 m Patient Age:    57 years       BP:           112/75 mmHg Patient Gender: F              HR:           60 bpm. Exam Location:  [facility] Procedure: 2D Echo, 3D Echo, Cardiac Doppler, Color Doppler and Strain Analysis Indications:     Acute MI  History:         Patient has no prior history of Echocardiogram examinations.                  Acute MI and CAD; Risk Factors:Hypertension, Diabetes and                  Dyslipidemia.  Sonographer:     Mikki Harbor Referring Phys:  [phone] Gerell Fortson D Kenni Newton Diagnosing Phys: Alwyn Pea MD  Sonographer Comments: Global longitudinal strain was attempted. IMPRESSIONS  1. Left ventricular ejection fraction, by estimation, is 50 to 55%. The left ventricle has low normal function. The left ventricle demonstrates regional wall motion abnormalities (see scoring diagram/findings for description). Left ventricular diastolic  parameters were normal.  2. Right ventricular systolic function is normal. The right ventricular size is mildly enlarged. Mildly increased right ventricular wall thickness.  3. The mitral valve is grossly normal. Trivial mitral valve regurgitation.  4. The aortic valve is normal in structure. Aortic valve regurgitation is not visualized. FINDINGS  Left Ventricle: Mild inferior hypo. Left ventricular ejection fraction, by estimation, is 50 to 55%. The left ventricle has low normal function. The left ventricle demonstrates  regional wall motion abnormalities. The left ventricular internal cavity size was normal in size. There is borderline concentric left ventricular hypertrophy. Left ventricular diastolic parameters were normal. Right Ventricle: The right ventricular size is mildly enlarged. Mildly increased  Patient ID: Caitlyn Roth, female   DOB: 03-11-1966, 57 y.o.   MRN: [id number] [facility]    SUBJECTIVE: Patient states he feels much better much improved chest pain no palpitation no tachycardia no right groin issues patient feels well with ambulating in the room feels well enough to be discharged home and follow-up as an outpatient   Vitals:   12/28/22 0900 12/28/22 1000 12/28/22 1100 12/28/22 1128  BP:    118/80  Pulse: 61 67 64 69  Resp: 15     Temp:    98.3 F (36.8 C)  TempSrc:    Oral  SpO2: 100% 99% 97% 97%  Weight:      Height:         Intake/Output Summary (Last 24 hours) at 12/28/2022 1151 Last data filed at 12/28/2022 1100 Gross per 24 hour  Intake 502.73 ml  Output 1850 ml  Net -1347.27 ml      PHYSICAL EXAM  General: Well developed, well nourished, in no acute distress HEENT:  Normocephalic and atramatic Neck:  No JVD.  Lungs: Clear bilaterally to auscultation and percussion. Heart: HRRR . Normal S1 and S2 without gallops or murmurs.  Abdomen: Bowel sounds are positive, abdomen soft and non-tender  Msk:  Back normal, normal gait. Normal strength and tone for age. Extremities: No clubbing, cyanosis or edema.   Neuro: Alert and oriented X 3. Psych:  Good affect, responds appropriately   LABS: Basic Metabolic Panel: Recent Labs    12/27/22 1102 12/28/22 0426  NA 133* 136  K 3.5 3.0*  CL 102 103  CO2 23 24  GLUCOSE 214* 117*  BUN 9 9  CREATININE 0.62 0.53  CALCIUM 8.7* 9.1  MG  --  1.9  PHOS  --  4.5   Liver Function Tests: Recent Labs    12/26/22 2354  AST 16  ALT 20  ALKPHOS 121  BILITOT 0.9  PROT 7.0  ALBUMIN 3.9   No results for input(s): "LIPASE", "AMYLASE" in the last 72 hours. CBC: Recent Labs    12/26/22 2354 12/27/22 0200 12/27/22 1102 12/28/22 0426  WBC 10.7*   < > 7.5 8.9  NEUTROABS 6.7  --   --   --   HGB 14.2   < > 12.5 13.8  HCT 40.9   < > 34.9* 38.2  MCV 89.3   < > 86.8 87.6  PLT 293   < > 223 230   < > =  values in this interval not displayed.   Cardiac Enzymes: No results for input(s): "CKTOTAL", "CKMB", "CKMBINDEX", "TROPONINI" in the last 72 hours. BNP: Invalid input(s): "POCBNP" D-Dimer: No results for input(s): "DDIMER" in the last 72 hours. Hemoglobin A1C: Recent Labs    12/27/22 0202  HGBA1C 13.6*   Fasting Lipid Panel: Recent Labs    12/26/22 2354  CHOL 238*  HDL 27*  LDLCALC 133*  TRIG 388*  CHOLHDL 8.8   Thyroid Function Tests: No results for input(s): "TSH", "T4TOTAL", "T3FREE", "THYROIDAB" in the last 72 hours.  Invalid input(s): "FREET3" Anemia Panel: No results for input(s): "VITAMINB12", "FOLATE", "FERRITIN", "TIBC", "IRON", "RETICCTPCT" in the last 72 hours.  CARDIAC CATHETERIZATION  Result Date: 12/27/2022   Mid RCA lesion is 99% stenosed.   A drug-eluting stent was successfully placed using a STENT ONYX FRONTIER 3.0X22.   Post intervention, there is a 0% residual stenosis.   There is mild left ventricular systolic dysfunction.   LV end diastolic pressure is normal.   The

## 2022-12-28 NOTE — Plan of Care (Signed)
  Problem: Education: Goal: Knowledge of General Education information will improve Description: Including pain rating scale, medication(s)/side effects and non-pharmacologic comfort measures Outcome: Progressing   Problem: Health Behavior/Discharge Planning: Goal: Ability to manage health-related needs will improve Outcome: Progressing   Problem: Clinical Measurements: Goal: Ability to maintain clinical measurements within normal limits will improve Outcome: Progressing Goal: Will remain free from infection Outcome: Progressing Goal: Diagnostic test results will improve Outcome: Progressing Goal: Respiratory complications will improve Outcome: Progressing Goal: Cardiovascular complication will be avoided Outcome: Progressing   Problem: Activity: Goal: Risk for activity intolerance will decrease Outcome: Progressing   Problem: Nutrition: Goal: Adequate nutrition will be maintained Outcome: Progressing   Problem: Coping: Goal: Level of anxiety will decrease Outcome: Progressing   Problem: Elimination: Goal: Will not experience complications related to bowel motility Outcome: Progressing   Problem: Pain Managment: Goal: General experience of comfort will improve Outcome: Progressing   

## 2022-12-28 NOTE — Plan of Care (Signed)
Continuing with plan of care. 

## 2022-12-28 NOTE — Plan of Care (Signed)
Patient is discharging home, goals met.

## 2022-12-28 NOTE — Progress Notes (Signed)
Discharge teaching completed with patient who verbalized understanding of teaching.  Patient has all belongings and is in stable condition.

## 2022-12-29 ENCOUNTER — Other Ambulatory Visit: Payer: Self-pay

## 2022-12-30 ENCOUNTER — Other Ambulatory Visit: Payer: Self-pay

## 2022-12-30 ENCOUNTER — Encounter: Payer: Self-pay | Admitting: Internal Medicine

## 2023-01-09 ENCOUNTER — Encounter: Payer: BC Managed Care – PPO | Attending: Internal Medicine | Admitting: *Deleted

## 2023-01-09 ENCOUNTER — Other Ambulatory Visit: Payer: BC Managed Care – PPO

## 2023-01-09 DIAGNOSIS — Z955 Presence of coronary angioplasty implant and graft: Secondary | ICD-10-CM

## 2023-01-09 DIAGNOSIS — I213 ST elevation (STEMI) myocardial infarction of unspecified site: Secondary | ICD-10-CM

## 2023-01-09 NOTE — Progress Notes (Signed)
Initial phone call completed. Diagnosis can be found in [facility] 8/18. EP Orientation scheduled for Wednesday 9/4 at 8am.  Caitlyn Roth is a current tobacco user. Intervention for tobacco cessation was provided at the initial medical review. She was asked about readiness to quit and reported that she doesn't feel ready right now given all the stress in her life currently . Patient was advised and educated about tobacco cessation using combination therapy, tobacco cessation classes, quit line, and quit smoking apps. Patient demonstrated understanding of this material. Staff will continue to provide encouragement and follow up with the patient throughout the program.

## 2023-01-20 ENCOUNTER — Encounter (HOSPITAL_COMMUNITY): Payer: Self-pay

## 2023-01-20 ENCOUNTER — Emergency Department: Payer: BC Managed Care – PPO

## 2023-01-20 ENCOUNTER — Other Ambulatory Visit: Payer: Self-pay

## 2023-01-20 ENCOUNTER — Emergency Department
Admission: EM | Admit: 2023-01-20 | Discharge: 2023-01-20 | Disposition: A | Discharge status: Nursing Home (Includes ICF) | Payer: BC Managed Care – PPO | Attending: Emergency Medicine | Admitting: Emergency Medicine

## 2023-01-20 DIAGNOSIS — R112 Nausea with vomiting, unspecified: Secondary | ICD-10-CM

## 2023-01-20 DIAGNOSIS — R000 Tachycardia, unspecified: Secondary | ICD-10-CM | POA: Insufficient documentation

## 2023-01-20 DIAGNOSIS — E109 Type 1 diabetes mellitus without complications: Secondary | ICD-10-CM | POA: Insufficient documentation

## 2023-01-20 DIAGNOSIS — E0500 Thyrotoxicosis with diffuse goiter without thyrotoxic crisis or storm: Secondary | ICD-10-CM | POA: Diagnosis present

## 2023-01-20 DIAGNOSIS — K759 Inflammatory liver disease, unspecified: Secondary | ICD-10-CM | POA: Diagnosis not present

## 2023-01-20 DIAGNOSIS — K8689 Other specified diseases of pancreas: Secondary | ICD-10-CM | POA: Insufficient documentation

## 2023-01-20 DIAGNOSIS — R17 Unspecified jaundice: Secondary | ICD-10-CM

## 2023-01-20 DIAGNOSIS — Z720 Tobacco use: Secondary | ICD-10-CM | POA: Diagnosis not present

## 2023-01-20 DIAGNOSIS — R1010 Upper abdominal pain, unspecified: Secondary | ICD-10-CM

## 2023-01-20 DIAGNOSIS — R1013 Epigastric pain: Secondary | ICD-10-CM | POA: Diagnosis present

## 2023-01-20 DIAGNOSIS — I2510 Atherosclerotic heart disease of native coronary artery without angina pectoris: Secondary | ICD-10-CM | POA: Diagnosis not present

## 2023-01-20 DIAGNOSIS — I10 Essential (primary) hypertension: Secondary | ICD-10-CM | POA: Insufficient documentation

## 2023-01-20 DIAGNOSIS — E1169 Type 2 diabetes mellitus with other specified complication: Secondary | ICD-10-CM

## 2023-01-20 LAB — CBC WITH DIFFERENTIAL/PLATELET
Abs Immature Granulocytes: 0.04 10*3/uL (ref 0.00–0.07)
Basophils Absolute: 0.1 10*3/uL (ref 0.0–0.1)
Basophils Relative: 1 %
Eosinophils Absolute: 0.3 10*3/uL (ref 0.0–0.5)
Eosinophils Relative: 3 %
HCT: 44.4 % (ref 36.0–46.0)
Hemoglobin: 15.1 g/dL — ABNORMAL HIGH (ref 12.0–15.0)
Immature Granulocytes: 0 %
Lymphocytes Relative: 21 %
Lymphs Abs: 2 10*3/uL (ref 0.7–4.0)
MCH: 30.5 pg (ref 26.0–34.0)
MCHC: 34 g/dL (ref 30.0–36.0)
MCV: 89.7 fL (ref 80.0–100.0)
Monocytes Absolute: 0.8 10*3/uL (ref 0.1–1.0)
Monocytes Relative: 8 %
Neutro Abs: 6.4 10*3/uL (ref 1.7–7.7)
Neutrophils Relative %: 67 %
Platelets: 314 10*3/uL (ref 150–400)
RBC: 4.95 MIL/uL (ref 3.87–5.11)
RDW: 12.4 % (ref 11.5–15.5)
WBC: 9.5 10*3/uL (ref 4.0–10.5)
nRBC: 0 % (ref 0.0–0.2)

## 2023-01-20 LAB — COMPREHENSIVE METABOLIC PANEL
ALT: 546 U/L — ABNORMAL HIGH (ref 0–44)
AST: 364 U/L — ABNORMAL HIGH (ref 15–41)
Albumin: 4.3 g/dL (ref 3.5–5.0)
Alkaline Phosphatase: 640 U/L — ABNORMAL HIGH (ref 38–126)
Anion gap: 13 (ref 5–15)
BUN: 14 mg/dL (ref 6–20)
CO2: 21 mmol/L — ABNORMAL LOW (ref 22–32)
Calcium: 10.1 mg/dL (ref 8.9–10.3)
Chloride: 97 mmol/L — ABNORMAL LOW (ref 98–111)
Creatinine, Ser: 0.34 mg/dL — ABNORMAL LOW (ref 0.44–1.00)
GFR, Estimated: >60 mL/min (ref >60)
Glucose, Bld: 356 mg/dL — ABNORMAL HIGH (ref 70–99)
Potassium: 4.6 mmol/L (ref 3.5–5.1)
Sodium: 131 mmol/L — ABNORMAL LOW (ref 135–145)
Total Bilirubin: 14.4 mg/dL — ABNORMAL HIGH (ref 0.3–1.2)
Total Protein: 7.7 g/dL (ref 6.5–8.1)

## 2023-01-20 LAB — PROTIME-INR
INR: 0.9 (ref 0.8–1.2)
Prothrombin Time: 12.4 s (ref 11.4–15.2)

## 2023-01-20 LAB — TSH: TSH: 0.026 u[IU]/mL — ABNORMAL LOW (ref 0.350–4.500)

## 2023-01-20 LAB — ETHANOL: Alcohol, Ethyl (B): <10 mg/dL (ref <10)

## 2023-01-20 LAB — HEPATITIS PANEL, ACUTE
HCV Ab: NONREACTIVE
Hep A IgM: NONREACTIVE
Hep B C IgM: NONREACTIVE
Hepatitis B Surface Ag: NONREACTIVE

## 2023-01-20 LAB — LIPASE, BLOOD: Lipase: 66 U/L — ABNORMAL HIGH (ref 11–51)

## 2023-01-20 LAB — T4, FREE: Free T4: 1.4 ng/dL — ABNORMAL HIGH (ref 0.61–1.12)

## 2023-01-20 LAB — TROPONIN I (HIGH SENSITIVITY): Troponin I (High Sensitivity): 14 ng/L (ref <18)

## 2023-01-20 MED ORDER — SODIUM CHLORIDE 0.9 % IV BOLUS
1000.0000 mL | Freq: Once | INTRAVENOUS | Status: AC
  Administered 2023-01-20: 1000 mL via INTRAVENOUS

## 2023-01-20 MED ORDER — LORAZEPAM 2 MG/ML IJ SOLN
1.0000 mg | Freq: Once | INTRAMUSCULAR | Status: AC | PRN
  Administered 2023-01-20: 1 mg via INTRAVENOUS
  Filled 2023-01-20: qty 1

## 2023-01-20 MED ORDER — GADOBUTROL 1 MMOL/ML IV SOLN
7.5000 mL | Freq: Once | INTRAVENOUS | Status: AC | PRN
  Administered 2023-01-20: 7.5 mL via INTRAVENOUS

## 2023-01-20 MED ORDER — ONDANSETRON HCL 4 MG/2ML IJ SOLN
4.0000 mg | Freq: Once | INTRAMUSCULAR | Status: AC
  Administered 2023-01-20: 4 mg via INTRAVENOUS
  Filled 2023-01-20: qty 2

## 2023-01-20 MED ORDER — CIPROFLOXACIN IN D5W 400 MG/200ML IV SOLN
400.0000 mg | Freq: Two times a day (BID) | INTRAVENOUS | Status: DC
  Administered 2023-01-20: 400 mg via INTRAVENOUS
  Filled 2023-01-20: qty 200

## 2023-01-20 MED ORDER — CLOPIDOGREL BISULFATE 75 MG PO TABS: 75.0000 mg | ORAL_TABLET | Freq: Every day | ORAL | Status: DC

## 2023-01-20 MED ORDER — METRONIDAZOLE 500 MG/100ML IV SOLN
500.0000 mg | Freq: Three times a day (TID) | INTRAVENOUS | Status: DC
  Administered 2023-01-20: 500 mg via INTRAVENOUS
  Filled 2023-01-20: qty 100

## 2023-01-20 MED ORDER — IOHEXOL 300 MG/ML  SOLN: 100.0000 mL | Freq: Once | INTRAMUSCULAR | Status: DC | PRN

## 2023-01-20 NOTE — ED Provider Notes (Addendum)
[facility] Provider Note    Event Date/Time   First MD Initiated Contact with Patient 01/20/23 [phone]     (approximate)   History   Abnormal Labs   HPI  Caitlyn Roth is a 57 y.o. female   Past medical history of insulin-dependent diabetic, recent STEMI, atrial fibrillation not on anticoagulation, Graves' disease on low-dose methimazole who presents to the emergency department with abnormal liver function testing as an outpatient and progressively worsening nausea, jaundice, epigastric pain.  Note she had a STEMI status post DES placement in the right coronary artery in early August.  Over the last approximately 1 week she has been progressively nauseous, fatigued.  Over the last 2 days this severely worsened and she had testing done on 8/29 that showed markedly elevated LFTs.  She states that her cardiologist switched her Brilinta and a cholesterol medication in response to these elevated liver enzymes and advised her to come to the emergency department should any symptoms acutely worsen, which they did over the weekend, she comes to the hospital today.  She denies alcohol use.  She is status post remote cholecystectomy about 30 years ago   External Medical Documents Reviewed: Discharge summary from 12/28/2022 after a STEMI and stent placement, echo at that time showed systolic function 50 to 55%.      Physical Exam   Triage Vital Signs: ED Triage Vitals  Encounter Vitals Group     BP 01/20/23 0836 (!) 151/96     Systolic BP Percentile --      Diastolic BP Percentile --      Pulse Rate 01/20/23 0836 (!) 103     Resp 01/20/23 0836 17     Temp 01/20/23 0839 98 F (36.7 C)     Temp Source 01/20/23 0839 Oral     SpO2 01/20/23 0836 100 %     Weight 01/20/23 0836 163 lb 5.8 oz (74.1 kg)     Height 01/20/23 0836 5\' 9"  (1.753 m)     Head Circumference --      Peak Flow --      Pain Score 01/20/23 0836 4     Pain Loc --      Pain Education --       Exclude from Growth Chart --     Most recent vital signs: Vitals:   01/20/23 0836 01/20/23 0839  BP: (!) 151/96   Pulse: (!) 103   Resp: 17   Temp:  98 F (36.7 C)  SpO2: 100%     General: Awake, no distress.  CV:  Good peripheral perfusion.  Resp:  Normal effort. Abd:  No distention.  Other:  Jaundiced and scleral icterus.  Mild epigastric/right upper quadrant tenderness to palpation.  Slightly tachycardic at 100 but afebrile.  Hypertensive.  Does not appear toxic.  Mentation is normal.   ED Results / Procedures / Treatments   Labs (all labs ordered are listed, but only abnormal results are displayed) Labs Reviewed  COMPREHENSIVE METABOLIC PANEL - Abnormal; Notable for the following components:      Result Value   Sodium 131 (*)    Chloride 97 (*)    CO2 21 (*)    Glucose, Bld 356 (*)    Creatinine, Ser 0.34 (*)    AST 364 (*)    ALT 546 (*)    Alkaline Phosphatase 640 (*)    Total Bilirubin 14.4 (*)    All other components within normal limits  LIPASE,  BLOOD - Abnormal; Notable for the following components:   Lipase 66 (*)    All other components within normal limits  CBC WITH DIFFERENTIAL/PLATELET - Abnormal; Notable for the following components:   Hemoglobin 15.1 (*)    All other components within normal limits  T4, FREE - Abnormal; Notable for the following components:   Free T4 1.40 (*)    All other components within normal limits  TSH - Abnormal; Notable for the following components:   TSH 0.026 (*)    All other components within normal limits  ETHANOL  HEPATITIS PANEL, ACUTE  TROPONIN I (HIGH SENSITIVITY)  TROPONIN I (HIGH SENSITIVITY)     I ordered and reviewed the above labs they are notable for her white blood cell count is normal.  Her H&H are slightly increased at 15.1/44 from 13.8/38  EKG  ED ECG REPORT I, Pilar Jarvis, the attending physician, personally viewed and interpreted this ECG.   Date: 01/20/2023  EKG Time: 0842  Rate: 94   Rhythm: sinus  Axis: nl  Intervals:none  ST&T Change:  no stemi    RADIOLOGY I independently reviewed and interpreted MRCP and note a pancreatic mass I also reviewed radiologist's formal read.   PROCEDURES:  Critical Care performed: No  Procedures   MEDICATIONS ORDERED IN ED: Medications  LORazepam (ATIVAN) injection 1 mg (1 mg Intravenous Given 01/20/23 0905)  sodium chloride 0.9 % bolus 1,000 mL (1,000 mLs Intravenous New Bag/Given 01/20/23 0906)  ondansetron (ZOFRAN) injection 4 mg (4 mg Intravenous Given 01/20/23 0904)  gadobutrol (GADAVIST) 1 MMOL/ML injection 7.5 mL (7.5 mLs Intravenous Contrast Given 01/20/23 1002)    External physician / consultants:  I spoke with hospitalist for admission and regarding care plan for this patient.   IMPRESSION / MDM / ASSESSMENT AND PLAN / ED COURSE  I reviewed the triage vital signs and the nursing notes.                                Patient's presentation is most consistent with acute presentation with potential threat to life or bodily function.  Differential diagnosis includes, but is not limited to, choledocholithiasis, acute hepatitis, cholangitis, shock liver/ischemic insult from STEMI, VTE, adverse effect of medications   The patient is on the cardiac monitor to evaluate for evidence of arrhythmia and/or significant heart rate changes.  MDM:    Patient with markedly elevated liver enzymes after STEMI approximately 1 month ago with progressively worsening epigastric pain and vomiting appears jaundiced.    Status post cholecystectomy remotely, concern for choledocholithiasis or other structural pathologies will get an MRCP as well as repeat CMP, lipase, basic labs.  Given persistent vomiting over this weekend we will give IV crystalloid bolus as well as antiemetic.    Check acute hepatitis panel.  Considered alcohol related but she is not an alcohol drinker.  I considered cholangitis but no leukocytosis fever nontoxic  appearance I doubt at this time.    I considered cardiac ischemia but in the setting of jaundice nausea vomiting and elevated liver enzymes I think this is less likely, she does have epigastric pain and recent STEMI will check EKG and troponins.    Considered ischemic insult from recent STEMI but timeline is nearly 1 month removed.    Considered ischemia from blood clot given history of A-fib not on anticoagulation.  Considered adverse effect of medications new from her STEMI, also diabetes management recently altered  medications given side effects of nausea noted from diabetes note earlier this month.  -- Indeed LFTs remain elevated with alk phos in the 600s, AST in the 300s and ALT in the 500s, with a markedly elevated total bilirubin of 14.4.  She continues to have poorly controlled blood sugars with a glucose in the 300s but no anion gap elevation.    Her T4 is elevated at 1.40, mild, and also in the setting of icterus biotin increasing T4 I doubt she is in a state of hyperthyroidism or thyrotoxicosis given no other signs.   Given her symptoms and markedly elevated liver enzymes she will be admitted for further evaluation and management   -- Unfortunately a mass at the pancreas was identified suspicious for primary pancreatic malignancy per radiology report.  Patient informed of these unfortunate findings, hospitalist paged for admission and discussion with GI.  Will require GI procedure endoscopy for tissue sampling as well as stenting, unable to perform here until much later this week, will start transfer process.       FINAL CLINICAL IMPRESSION(S) / ED DIAGNOSES   Final diagnoses:  Hepatitis  Jaundice  Nausea and vomiting, unspecified vomiting type  Upper abdominal pain  Pancreatic mass     Rx / DC Orders   ED Discharge Orders     None        Note:  This document was prepared using Dragon voice recognition software and may include unintentional dictation  errors.    Pilar Jarvis, MD 01/20/23 1037    Pilar Jarvis, MD 01/20/23 [phone]

## 2023-01-20 NOTE — ED Notes (Signed)
RN to bedside, pt requesting to go home. Pt expressing that she has been here for 8 hours and can just wait at home and be called by [facility]. Dr Larinda Buttery to bedside to discuss with pt the necessity of being admitted to the hospital, pt expresses that husband has passed and she is ok is goes home and dies.  RN made multiple attempts to make pt more comfortable; recliner, tv remote, ice chips provided. Chaplain called.

## 2023-01-20 NOTE — ED Triage Notes (Signed)
Pt here for elevated liver enzymes. Pt also has a yellow hue to her skin. Pt was told Fri that pt needed to follow up with GI. Pt is nauseous at the moment. Pt also had a MI 3 weeks ago.

## 2023-01-20 NOTE — ED Notes (Signed)
Xray  [facility]  [facility] 

## 2023-01-20 NOTE — ED Notes (Signed)
Pt ambulatory to restroom

## 2023-01-20 NOTE — Assessment & Plan Note (Signed)
T. 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 Timothy Lasso who recommends transfer to facility where ERCP is more readily available Pending formal GI consultation in the ER

## 2023-01-20 NOTE — Consult Note (Signed)
Inpatient Consultation   Patient ID: Caitlyn Roth is a 57 y.o. female.  Requesting Provider: Pilar Jarvis, MD (Emergency Med)  Date of Admission: 01/20/2023  Date of Consult: 01/20/23   Reason for Consultation: pancreatic mass   Patient's Chief Complaint:   Chief Complaint  Patient presents with   Abnormal Labs    57 year old Caucasian female with extensive cardiac history including recent STEMI requiring PCI DES on DAPT, A-fib not on Eliquis who presented to the hospital under the instruction of her cardiologist due to increasing jaundice abdominal pain.  Gastroenterology is consulted for imaging findings of pancreatic mass.  Patient reports that she started having vomiting with abdominal pain over the weekend.  Vomiting began on Saturday without hematemesis or coffee-ground emesis.  She reports that the abdominal pain has been coming and going for multiple months.  She also notes back and right upper quadrant pain for the last 3 months.  She is status post cholecystectomy approximately 30 years ago.  Her appetite has been diminished with early satiety.  She attributed this to her the passing of her husband 15 months ago.  In that time.  She has lost 80 pounds.  She describes acholic stools over the last 24 to 48 hours.  No melena or hematochezia.  MRI MRCP demonstrates pancreatic head mass compressing the mid CBD resulting in proximal CBD dilation to 12 mm and intrahepatic dilation.  Total bilirubin is elevated at 14 with transaminitis present.  Active tobacco use.  Currently on plavix  Denies NSAIDs anticoagulants Denies family history of gastrointestinal disease and malignancy Previous Endoscopies: Reports multiple egds in the past due to reported barretts esophagus without dysplasia- told on last egd that this was not present and she did not need to undergo further surveillance- possibly 2019 (prior to that was 2011 or 2012)  No previous colonoscopy- has documented refusal in  the past in gi note (06/2017)    Past Medical History:  Diagnosis Date   A-fib [facility] [place])    a.) CHA2DS2VASc = 6 (sex, HTN, CVA/TIA x2,  vascular disease history, T2DM);  b.) rate/rhythm maintained on oral metoprolol succinate; no chronic anticoagulation   Acute ST elevation myocardial infarction (STEMI) of inferior wall (HCC) 01/28/2015   a.) LHC/PCI 01/28/2015 --> 70% pRCA (3.5 x 20 mm Promus Premier)   ADHD (attention deficit hyperactivity disorder)    a.) on amphetamine-dextroamphetamine   Anxiety    Arthritis    Asthma    CAD (coronary artery disease) 01/28/2015   a.) LHC/PCI 01/28/2015 --> 70% pRCA (3.5 x 20 mm Promus Premier DES)   Diabetic polyneuropathy (HCC)    Diastolic dysfunction 01/28/2015   a.) TTE 01/28/2015: EF 50, inf/post HK, mild BAE, triv MR/TR, G1DD   Eczema    Family history of adverse reaction to anesthesia    a.) delayed emergence in 1st degree relative (father)   [name] corneal dystrophy    GERD (gastroesophageal reflux disease)    Graves disease    a.) on methimazole   Hepatic steatosis    Hyperlipemia    Hypertension    Migraines    Right rotator cuff tendinitis    Sleep apnea    Stroke (HCC) 2009   peripheral vision in left eye has been affected,   T2DM (type 2 diabetes mellitus) (HCC)    Vitamin B 12 deficiency     Past Surgical History:  Procedure Laterality Date   CARPOMETACARPAL (CMC) FUSION OF THUMB Left 08/07/2022   Procedure: LEFT THUMB  CMC ARTHROPLASTY;  Surgeon: Christena Flake, MD;  Location: [facility] ORS;  Service: Orthopedics;  Laterality: Left;   CESAREAN SECTION     CHOLECYSTECTOMY     CORONARY ANGIOPLASTY WITH STENT PLACEMENT Left 01/28/2015   Procedure: CORONARY ANGIOPLASTY WITH STENT PLACEMENT; Location: [facility]   CORONARY/GRAFT ACUTE MI REVASCULARIZATION N/A 12/27/2022   Procedure: Coronary/Graft Acute MI Revascularization;  Surgeon: Alwyn Pea, MD;  Location: [facility] INVASIVE [facility];  Service: Cardiovascular;  Laterality: N/A;   KNEE  ARTHROSCOPY Left    KNEE SURGERY Left 1990   McKay's procedure   LEFT HEART CATH AND CORONARY ANGIOGRAPHY N/A 12/27/2022   Procedure: LEFT HEART CATH AND CORONARY ANGIOGRAPHY;  Surgeon: Alwyn Pea, MD;  Location: [facility];  Service: Cardiovascular;  Laterality: N/A;   SHOULDER ARTHROSCOPY WITH OPEN ROTATOR CUFF REPAIR Right 10/01/2016   Procedure: SHOULDER ARTHROSCOPY WITH OPEN ROTATOR CUFF REPAIR;  Surgeon: Christena Flake, MD;  Location: [facility] OR[facility];  Service: Orthopedics;  Laterality: Right;  Shoulder Block    SINUS SURGERY WITH INSTATRAK     TONSILLECTOMY  1978   TUBAL LIGATION      Allergies  Allergen Reactions   Cefdinir Nausea And Vomiting and Other (See Comments)    Swelling of throat.   Milk (Cow) Other (See Comments) and Anaphylaxis    Other reaction(s): OTHER  Difficulty breathing, throat swells   Prednisone     Other reaction(s): Other (See Comments) LOST 40% OF VISION IN ONE EYE  Patient reports she is allergic to "all steroids"   Acyclovir Nausea And Vomiting    Other reaction(s): Unknown   Codeine Nausea Only   Other Nausea Only    Antibiotic (oral) patient unsure which one    Family History  Problem Relation Age of Onset   Breast cancer Maternal Aunt 13    Social History   Tobacco Use   Smoking status: Every Day    Current packs/day: 0.50    Average packs/day: 0.5 packs/day for 35.0 years (17.5 ttl pk-yrs)    Types: Cigarettes   Smokeless tobacco: Never  Vaping Use   Vaping status: Never Used  Substance Use Topics   Alcohol use: Yes    Comment: rare socially   Drug use: No     Pertinent GI related history and allergies were reviewed with the patient  Review of Systems  Constitutional:  Positive for appetite change and unexpected weight change. Negative for activity change, chills, diaphoresis, fatigue and fever.  HENT:  Negative for trouble swallowing and voice change.   Respiratory:  Negative for shortness of breath and  wheezing.   Cardiovascular:  Negative for chest pain, palpitations and leg swelling.  Gastrointestinal:  Positive for abdominal pain, nausea and vomiting. Negative for abdominal distention, anal bleeding, blood in stool, constipation, diarrhea and rectal pain.       + Acholic stool  Musculoskeletal:  Negative for arthralgias and myalgias.  Skin:  Positive for color change. Negative for pallor.       + pruritus  Neurological:  Negative for dizziness, syncope and weakness.  Psychiatric/Behavioral:  Negative for confusion.   All other systems reviewed and are negative.    Medications Home Medications No current facility-administered medications on file prior to encounter.   Current Outpatient Medications on File Prior to Encounter  Medication Sig Dispense Refill   albuterol (VENTOLIN HFA) 108 (90 Base) MCG/ACT inhaler Inhale 2 puffs into the lungs every 6 (six) hours as needed for wheezing or shortness  of breath.     amphetamine-dextroamphetamine (ADDERALL XR) 30 MG 24 hr capsule Take 30 mg by mouth daily.     aspirin EC 81 MG tablet Take 1 tablet (81 mg total) by mouth daily. Swallow whole. 30 tablet 0   brimonidine (ALPHAGAN) 0.2 % ophthalmic solution Place 1 drop into the right eye 3 (three) times daily.     busPIRone (BUSPAR) 5 MG tablet Take 5 mg by mouth 3 (three) times daily.     Cyanocobalamin (B-12) 1000 MCG/ML KIT Inject 1 mL as directed once a week.     desloratadine (CLARINEX) 5 MG tablet Take 5 mg by mouth daily.      empagliflozin (JARDIANCE) 25 MG TABS tablet Take 25 mg by mouth daily.     gabapentin (NEURONTIN) 300 MG capsule Take 600 mg by mouth at bedtime as needed (neuropathy).     glipiZIDE (GLUCOTROL) 5 MG tablet Take 5 mg by mouth daily before breakfast.     lisinopril (ZESTRIL) 5 MG tablet Take 1 tablet (5 mg total) by mouth daily. 30 tablet 1   losartan (COZAAR) 50 MG tablet Take 1 tablet by mouth daily.     loteprednol (LOTEMAX) 0.5 % ophthalmic suspension Place 1  drop into the right eye daily.     metFORMIN (GLUCOPHAGE-XR) 500 MG 24 hr tablet Take 1 tablet (500 mg total) by mouth daily with breakfast. (Patient not taking: Reported on 01/09/2023) 30 tablet 1   methimazole (TAPAZOLE) 5 MG tablet Take 2.5 mg by mouth at bedtime.     metoprolol succinate (TOPROL-XL) 50 MG 24 hr tablet Take 1 tablet (50 mg total) by mouth daily. Take with or immediately following a meal. (Patient taking differently: Take 25 mg by mouth daily. Take with or immediately following a meal.) 30 tablet 1   montelukast (SINGULAIR) 10 MG tablet Take 10 mg by mouth at bedtime.     pantoprazole (PROTONIX) 40 MG tablet Take 40 mg by mouth at bedtime.     rosuvastatin (CRESTOR) 20 MG tablet Take 20 mg by mouth daily.     ticagrelor (BRILINTA) 90 MG TABS tablet Take 1 tablet (90 mg total) by mouth 2 (two) times daily. 60 tablet 0   timolol (BETIMOL) 0.5 % ophthalmic solution Place 1 drop into the right eye 2 (two) times daily.     venlafaxine XR (EFFEXOR-XR) 75 MG 24 hr capsule Take 225 mg by mouth daily with breakfast.     Pertinent GI related medications were reviewed with the patient  Inpatient Medications  Current Facility-Administered Medications:    ciprofloxacin (CIPRO) IVPB 400 mg, 400 mg, Intravenous, Q12H, Pilar Jarvis, MD, Stopped at 01/20/23 1422   clopidogrel (PLAVIX) tablet 75 mg, 75 mg, Oral, Daily, Floydene Flock, MD   iohexol (OMNIPAQUE) 300 MG/ML solution 100 mL, 100 mL, Intravenous, Once PRN, Floydene Flock, MD   metroNIDAZOLE (FLAGYL) IVPB 500 mg, 500 mg, Intravenous, Q8H, Pilar Jarvis, MD, Last Rate: 100 mL/hr at 01/20/23 1424, 500 mg at 01/20/23 1424  Current Outpatient Medications:    albuterol (VENTOLIN HFA) 108 (90 Base) MCG/ACT inhaler, Inhale 2 puffs into the lungs every 6 (six) hours as needed for wheezing or shortness of breath., Disp: , Rfl:    amphetamine-dextroamphetamine (ADDERALL XR) 30 MG 24 hr capsule, Take 30 mg by mouth daily., Disp: , Rfl:     aspirin EC 81 MG tablet, Take 1 tablet (81 mg total) by mouth daily. Swallow whole., Disp: 30 tablet, Rfl: 0  brimonidine (ALPHAGAN) 0.2 % ophthalmic solution, Place 1 drop into the right eye 3 (three) times daily., Disp: , Rfl:    busPIRone (BUSPAR) 5 MG tablet, Take 5 mg by mouth 3 (three) times daily., Disp: , Rfl:    Cyanocobalamin (B-12) 1000 MCG/ML KIT, Inject 1 mL as directed once a week., Disp: , Rfl:    desloratadine (CLARINEX) 5 MG tablet, Take 5 mg by mouth daily. , Disp: , Rfl:    empagliflozin (JARDIANCE) 25 MG TABS tablet, Take 25 mg by mouth daily., Disp: , Rfl:    gabapentin (NEURONTIN) 300 MG capsule, Take 600 mg by mouth at bedtime as needed (neuropathy)., Disp: , Rfl:    glipiZIDE (GLUCOTROL) 5 MG tablet, Take 5 mg by mouth daily before breakfast., Disp: , Rfl:    lisinopril (ZESTRIL) 5 MG tablet, Take 1 tablet (5 mg total) by mouth daily., Disp: 30 tablet, Rfl: 1   losartan (COZAAR) 50 MG tablet, Take 1 tablet by mouth daily., Disp: , Rfl:    loteprednol (LOTEMAX) 0.5 % ophthalmic suspension, Place 1 drop into the right eye daily., Disp: , Rfl:    metFORMIN (GLUCOPHAGE-XR) 500 MG 24 hr tablet, Take 1 tablet (500 mg total) by mouth daily with breakfast. (Patient not taking: Reported on 01/09/2023), Disp: 30 tablet, Rfl: 1   methimazole (TAPAZOLE) 5 MG tablet, Take 2.5 mg by mouth at bedtime., Disp: , Rfl:    metoprolol succinate (TOPROL-XL) 50 MG 24 hr tablet, Take 1 tablet (50 mg total) by mouth daily. Take with or immediately following a meal. (Patient taking differently: Take 25 mg by mouth daily. Take with or immediately following a meal.), Disp: 30 tablet, Rfl: 1   montelukast (SINGULAIR) 10 MG tablet, Take 10 mg by mouth at bedtime., Disp: , Rfl:    pantoprazole (PROTONIX) 40 MG tablet, Take 40 mg by mouth at bedtime., Disp: , Rfl:    rosuvastatin (CRESTOR) 20 MG tablet, Take 20 mg by mouth daily., Disp: , Rfl:    ticagrelor (BRILINTA) 90 MG TABS tablet, Take 1 tablet (90 mg  total) by mouth 2 (two) times daily., Disp: 60 tablet, Rfl: 0   timolol (BETIMOL) 0.5 % ophthalmic solution, Place 1 drop into the right eye 2 (two) times daily., Disp: , Rfl:    venlafaxine XR (EFFEXOR-XR) 75 MG 24 hr capsule, Take 225 mg by mouth daily with breakfast., Disp: , Rfl:   ciprofloxacin Stopped (01/20/23 1422)   metronidazole 500 mg (01/20/23 1424)    iohexol   Objective   Vitals:   01/20/23 1200 01/20/23 1230 01/20/23 1300 01/20/23 1330  BP: (!) 159/77 (!) 150/87 (!) 149/99 (!) 160/99  Pulse: 80 93 92 (!) 101  Resp: 19 17 14 20   Temp:  98.4 F (36.9 C)    TempSrc:      SpO2: 100% 100% 99% 98%  Weight:      Height:         Physical Exam Vitals and nursing note reviewed.  Constitutional:      General: She is not in acute distress.    Appearance: She is ill-appearing. She is not toxic-appearing or diaphoretic.  HENT:     Head: Normocephalic and atraumatic.     Nose: Nose normal.     Mouth/Throat:     Mouth: Mucous membranes are moist.     Pharynx: Oropharynx is clear.  Eyes:     General: Scleral icterus present.     Extraocular Movements: Extraocular movements intact.  Cardiovascular:  Rate and Rhythm: Normal rate and regular rhythm.     Heart sounds: Normal heart sounds. No murmur heard.    No friction rub. No gallop.  Pulmonary:     Effort: Pulmonary effort is normal. No respiratory distress.     Breath sounds: Normal breath sounds. No wheezing, rhonchi or rales.  Abdominal:     General: Bowel sounds are normal. There is no distension.     Palpations: Abdomen is soft.     Tenderness: There is abdominal tenderness (mild- decreased from earlier). There is no guarding or rebound.  Musculoskeletal:     Cervical back: Neck supple.     Right lower leg: No edema.     Left lower leg: No edema.  Skin:    General: Skin is warm and dry.     Coloration: Skin is jaundiced. Skin is not pale.  Neurological:     General: No focal deficit present.     Mental  Status: She is alert and oriented to person, place, and time. Mental status is at baseline.  Psychiatric:        Mood and Affect: Mood normal.        Behavior: Behavior normal.        Thought Content: Thought content normal.        Judgment: Judgment normal.     Laboratory Data Recent Labs  Lab 01/20/23 0837  WBC 9.5  HGB 15.1*  HCT 44.4  PLT 314   Recent Labs  Lab 01/20/23 0837  NA 131*  K 4.6  CL 97*  CO2 21*  BUN 14  CALCIUM 10.1  PROT 7.7  BILITOT 14.4*  ALKPHOS 640*  ALT 546*  AST 364*  GLUCOSE 356*   No results for input(s): "INR" in the last 168 hours.  Recent Labs    01/20/23 0837  LIPASE 66*        Imaging Studies: MR ABDOMEN MRCP W WO CONTAST  Result Date: 01/20/2023 CLINICAL DATA:  57 year old female with history of right upper quadrant abdominal pain and epigastric pain with jaundice. Nausea and vomiting. Elevated liver function tests. EXAM: MRI ABDOMEN WITHOUT AND WITH CONTRAST (INCLUDING MRCP) TECHNIQUE: Multiplanar multisequence MR imaging of the abdomen was performed both before and after the administration of intravenous contrast. Heavily T2-weighted images of the biliary and pancreatic ducts were obtained, and three-dimensional MRCP images were rendered by post processing. CONTRAST:  7.62mL GADAVIST GADOBUTROL 1 MMOL/ML IV SOLN COMPARISON:  No prior abdominal MRI. FINDINGS: Comment: Portions of today's examination are limited by substantial patient respiratory motion. Lower chest: Unremarkable. Hepatobiliary: Mild diffuse loss of signal intensity throughout the hepatic parenchyma on out of phase dual echo images, indicative of a background of hepatic steatosis. Status post cholecystectomy. No suspicious cystic or solid hepatic lesions are confidently identified on today's motion limited examination. Status post cholecystectomy. MRCP images demonstrate moderate intra and extrahepatic biliary ductal dilatation. Proximal common bile duct measures 12 mm in  diameter. There is complete obscuration of the mid common bile duct secondary to a mass in the head of the pancreas (discussed below). Distal common bile duct is 6 mm. No filling defect in the common bile duct to suggest choledocholithiasis. Pancreas: In the head of the pancreas (axial image 55 of series 28 and coronal image 44 of series 30) there is a 2.5 x 2.4 x 2.4 cm hypovascular mass which is low T1 signal intensity, slightly high T2 signal intensity and demonstrates low-level diffusion restriction. This is intimately associated with  the mid common bile duct which appears to occlude or nearly completely occlude. Additionally, this appears to occlude the main pancreatic duct which is dilated throughout the body and tail of the pancreas measuring up to 5 mm in diameter on MRCP images. Mild atrophy throughout the body and tail of the pancreas. No peripancreatic fluid collections or inflammatory changes are noted. Spleen:  Unremarkable. Adrenals/Urinary Tract: In the anterior aspect of the upper pole of the left kidney (axial image 21 of series 12) there is a subtle lesion which is largely obscured by motion on many of the pulse sequences, but appears to be isointense on T1 weighted images, slightly T2 hypointense, and appears to be hypovascular with possible internal architecture, considered indeterminate. Right kidney and bilateral adrenal glands are otherwise normal in appearance. No hydroureteronephrosis. Stomach/Bowel: Visualized portions are unremarkable. Vascular/Lymphatic: Superior mesenteric vein, splenic vein and splenoportal confluence are poorly demonstrated on today's motion limited examination but appear substantially narrowed by the pancreatic head mass, remaining grossly patent at this time. There is a fat plane between the lesion and the superior mesenteric artery. Accurate assessment of branches of the celiac axis in relationship to the pancreatic head mass is not possible on today's examination.  No aneurysm identified in the visualized abdominal vasculature. Prominent lymph nodes adjacent to the hepatic hilum best appreciated on diffusion-weighted images, where the largest of these measures up to 1.3 cm in short axis in the portacaval nodal station (axial image 77 of series 13). No other definite lymphadenopathy noted in the abdomen. Other: No significant volume of ascites noted in the visualized portions of the peritoneal cavity. Musculoskeletal: No aggressive appearing osseous lesions are noted in the visualized portions of the skeleton. IMPRESSION: 1. 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 Several prominent borderline enlarged lymph nodes in the upper abdomen are nonspecific, but suspicious for potential nodal metastasis. 2. Indeterminate lesion in the anterior aspect of the upper pole of the left kidney, suspicious for potential papillary renal cell carcinoma. Close attention on any future follow-up studies is recommended. Prior to any future MR imaging, the patient should be reminded to adequately hold her breath during the examination to ensure diagnostic image quality. 3. Hepatic steatosis. Electronically Signed   By: Trudie Reed M.D.   On: 01/20/2023 10:32   MR 3D Recon At Scanner  Result Date: 01/20/2023 CLINICAL DATA:  57 year old female with history of right upper quadrant abdominal pain and epigastric pain with jaundice. Nausea and vomiting. Elevated liver function tests. EXAM: MRI ABDOMEN WITHOUT AND WITH CONTRAST (INCLUDING MRCP) TECHNIQUE: Multiplanar multisequence MR imaging of the abdomen was performed both before and after the administration of intravenous contrast. Heavily T2-weighted images of the biliary and pancreatic ducts were obtained, and three-dimensional MRCP images were rendered by post  processing. CONTRAST:  7.54mL GADAVIST GADOBUTROL 1 MMOL/ML IV SOLN COMPARISON:  No prior abdominal MRI. FINDINGS: Comment: Portions of today's examination are limited by substantial patient respiratory motion. Lower chest: Unremarkable. Hepatobiliary: Mild diffuse loss of signal intensity throughout the hepatic parenchyma on out of phase dual echo images, indicative of a background of hepatic steatosis. Status post cholecystectomy. No suspicious cystic or solid hepatic lesions are confidently identified on today's motion limited examination. Status post cholecystectomy. MRCP images demonstrate moderate intra and extrahepatic biliary ductal dilatation. Proximal common bile duct measures 12 mm in diameter. There is  complete obscuration of the mid common bile duct secondary to a mass in the head of the pancreas (discussed below). Distal common bile duct is 6 mm. No filling defect in the common bile duct to suggest choledocholithiasis. Pancreas: In the head of the pancreas (axial image 55 of series 28 and coronal image 44 of series 30) there is a 2.5 x 2.4 x 2.4 cm hypovascular mass which is low T1 signal intensity, slightly high T2 signal intensity and demonstrates low-level diffusion restriction. This is intimately associated with the mid common bile duct which appears to occlude or nearly completely occlude. Additionally, this appears to occlude the main pancreatic duct which is dilated throughout the body and tail of the pancreas measuring up to 5 mm in diameter on MRCP images. Mild atrophy throughout the body and tail of the pancreas. No peripancreatic fluid collections or inflammatory changes are noted. Spleen:  Unremarkable. Adrenals/Urinary Tract: In the anterior aspect of the upper pole of the left kidney (axial image 21 of series 12) there is a subtle lesion which is largely obscured by motion on many of the pulse sequences, but appears to be isointense on T1 weighted images, slightly T2 hypointense, and  appears to be hypovascular with possible internal architecture, considered indeterminate. Right kidney and bilateral adrenal glands are otherwise normal in appearance. No hydroureteronephrosis. Stomach/Bowel: Visualized portions are unremarkable. Vascular/Lymphatic: Superior mesenteric vein, splenic vein and splenoportal confluence are poorly demonstrated on today's motion limited examination but appear substantially narrowed by the pancreatic head mass, remaining grossly patent at this time. There is a fat plane between the lesion and the superior mesenteric artery. Accurate assessment of branches of the celiac axis in relationship to the pancreatic head mass is not possible on today's examination. No aneurysm identified in the visualized abdominal vasculature. Prominent lymph nodes adjacent to the hepatic hilum best appreciated on diffusion-weighted images, where the largest of these measures up to 1.3 cm in short axis in the portacaval nodal station (axial image 77 of series 13). No other definite lymphadenopathy noted in the abdomen. Other: No significant volume of ascites noted in the visualized portions of the peritoneal cavity. Musculoskeletal: No aggressive appearing osseous lesions are noted in the visualized portions of the skeleton. IMPRESSION: 1. 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 Several prominent borderline enlarged lymph nodes in the upper abdomen are nonspecific, but suspicious for potential nodal metastasis. 2. Indeterminate lesion in the anterior aspect of the upper pole of the left kidney, suspicious for potential papillary renal cell carcinoma. Close attention on any future follow-up studies is recommended. Prior to any future MR imaging, the patient should be reminded to adequately hold her breath during  the examination to ensure diagnostic image quality. 3. Hepatic steatosis. Electronically Signed   By: Trudie Reed M.D.   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 -Epigastric pain present -Tobacco use history -80 pound weight loss over the last 18 months -History of cholecystectomy  #Jaundice and transaminitis-secondary to above  #Abnormal weight loss  #Recent STEMI status  post PCI DES intervention on Plavix and aspirin  # reported h/o barretts esophagus- most recent egd several years ago pt was told she did not have this and no need for further surveillance  #CAD  # afib not on anticoagulation  #Hypothyroidism  #Anxiety and depression  #DM2  Plan:  Patient will need both ERCP and likely endoscopic ultrasound for definitive diagnosis Should these not be able to be performed timely manner would be PTC placed by radiology, although this would be difficult given her need for antiplatelet agent Maintain n.p.o. status Currently on Cipro and Flagyl Continue Plavix for the time being given recent STEMI and stent placement Pending transfer to tertiary care facility Supportive care including antiemetics and pain control as per primary team  I personally performed the service.  Management of other medical comorbidities as per primary team  Thank you for allowing [place] to participate in this patient's care. Please don't hesitate to call if any questions or concerns arise.   Jaynie Collins, DO [facility]  Portions of the record may have been created with voice recognition software. Occasional wrong-word or  'sound-a-like' substitutions may have occurred due to the inherent limitations of voice recognition software.  Read the chart carefully and recognize, using context, where substitutions may have occurred.

## 2023-01-20 NOTE — ED Notes (Signed)
EMTALA reviewed by charge RN 

## 2023-01-20 NOTE — ED Notes (Signed)
[facility]  called  per  Dr  Modesto Charon  MD

## 2023-01-20 NOTE — ED Notes (Signed)
Paper copy of transfer consent signed

## 2023-01-20 NOTE — ED Notes (Signed)
See triage note, pt reports having RUQ pain, emesis for the past few days. Also reports elevated liver enzymes. Reports recent heart attack with stent placement a few weeks ago.  Pt noted to have jaundice color.  Alert and oriented. NAD noted

## 2023-01-20 NOTE — ED Notes (Signed)
[facility]  called  for  transport  to  [facility]

## 2023-01-20 NOTE — Assessment & Plan Note (Signed)
Patient admitted 12/2022 with STEMI, found to have obstructive CAD in RCA, status post DES placement  Minimal chest pain present Continue home regimen including aspirin and Plavix

## 2023-01-20 NOTE — ED Notes (Signed)
Report to Nyoka Cowden

## 2023-01-20 NOTE — ED Notes (Signed)
Lab contacted to see result of troponin, lab states it has been discontinued and order will need to be placed again. Confirmed with dr Modesto Charon, order was not discontinued. Order placed again.

## 2023-01-20 NOTE — Progress Notes (Signed)
Per request of RN This chaplain visited with pt. Pt immediately asked this chaplain to leave room upon entry, she said "I don't need to talk leave me alone" Chaplain respected pt wishes and informed pt if she changed her mind id be happy to come back down. Informed nurse/charge rn of this. They will page if pt request visit. Pt has new terminal diagnosis, so a follow up visit may be helpful if pt is open to it.

## 2023-01-20 NOTE — ED Notes (Signed)
XRAY  POWERSHARE  WITH  [facility] 

## 2023-01-20 NOTE — Assessment & Plan Note (Signed)
On methimazole.

## 2023-01-20 NOTE — Consult Note (Signed)
Initial Consultation Note   Patient: Caitlyn Roth:[id number] DOB: 1965/09/05 PCP: Patrice Paradise, MD DOA: 01/20/2023 DOS: the patient was seen and examined on 01/20/2023 Primary service: Pilar Jarvis, MD  Referring physician: Pilar Jarvis, MD Reason for consult: Jaundice  Assessment/Plan: Assessment and Plan: Jaundice T. 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 Timothy Lasso who recommends transfer to facility where ERCP is more readily available Pending formal GI consultation in the ER   Graves' disease On methimazole  Hypertension BP stable at present  CAD (coronary artery disease) Patient admitted 12/2022 with STEMI, found to have obstructive CAD in RCA, status post DES placement  Minimal chest pain present Continue home regimen including aspirin and Plavix       TRH will sign off at present, please call us again when needed.  HPI: Caitlyn Roth is a 57 y.o. female with past medical history of multiple medical issues including atrial fibrillation, coronary artery disease with STEMI status post stenting 12/2022, asthma, Graves' disease on methimazole, hypertension, hyperlipidemia, type 2 diabetes presenting with hyperbilirubinemia.  Patient noted to have been admitted roughly 1 month ago for chest pain.  Patient with noted obstructive disease requiring stent placement.  Patient reports otherwise normal follow-up since discharge.  Has noticed severe worsening nausea over the past 2 to 3 days.  No fevers or chills.  Positive moderate periumbilical pain.  No significant chest pain with noted recent recent STEMI.  Still smoking 1/4 pack/day. Presented to the ER afebrile, hemodynamically stable.  Satting well on room air.  White count 9.5, hemoglobin 15, platelets 314, sodium 131, glucose 356, creatinine 0.34, T. bili 14.4, AST 364, ALT 546, alk phos 640.  TSH 0.026.  T4 1.4.  MRCP with noted  aggressive appearing hypervascular mass in the head of the pancreas associated pancreatic duct obstruction, bile duct obstruction concerning for primary pancreatic neoplasm.  Noted indeterminate lesion of the anterior aspect of the upper pole of the left kidney suspicious for potential papillary renal cell carcinoma.  Review of Systems: As mentioned in the history of present illness. All other systems reviewed and are negative. Past Medical History:  Diagnosis Date   A-fib [facility])    a.) CHA2DS2VASc = 6 (sex, HTN, CVA/TIA x2,  vascular disease history, T2DM);  b.) rate/rhythm maintained on oral metoprolol succinate; no chronic anticoagulation   Acute ST elevation myocardial infarction (STEMI) of inferior wall (HCC) 01/28/2015   a.) LHC/PCI 01/28/2015 --> 70% pRCA (3.5 x 20 mm Promus Premier)   ADHD (attention deficit hyperactivity disorder)    a.) on amphetamine-dextroamphetamine   Anxiety    Arthritis    Asthma    CAD (coronary artery disease) 01/28/2015   a.) LHC/PCI 01/28/2015 --> 70% pRCA (3.5 x 20 mm Promus Premier DES)   Diabetic polyneuropathy (HCC)    Diastolic dysfunction 01/28/2015   a.) TTE 01/28/2015: EF 50, inf/post HK, mild BAE, triv MR/TR, G1DD   Eczema    Family history of adverse reaction to anesthesia    a.) delayed emergence in 1st degree relative (father)   [name] corneal dystrophy    GERD (gastroesophageal reflux disease)    Graves disease    a.) on methimazole   Hepatic steatosis    Hyperlipemia    Hypertension    Migraines    Right rotator cuff tendinitis    Sleep apnea    Stroke (HCC) 2009   peripheral vision  in left eye has been affected,   T2DM (type 2 diabetes mellitus) (HCC)    Vitamin B 12 deficiency    Past Surgical History:  Procedure Laterality Date   CARPOMETACARPAL (CMC) FUSION OF THUMB Left 08/07/2022   Procedure: LEFT THUMB CMC ARTHROPLASTY;  Surgeon: Christena Flake, MD;  Location: [facility] ORS;  Service: Orthopedics;  Laterality: Left;   CESAREAN  SECTION     CHOLECYSTECTOMY     CORONARY ANGIOPLASTY WITH STENT PLACEMENT Left 01/28/2015   Procedure: CORONARY ANGIOPLASTY WITH STENT PLACEMENT; Location: [facility]   CORONARY/GRAFT ACUTE MI REVASCULARIZATION N/A 12/27/2022   Procedure: Coronary/Graft Acute MI Revascularization;  Surgeon: Alwyn Pea, MD;  Location: [facility] INVASIVE CV LAB;  Service: Cardiovascular;  Laterality: N/A;   KNEE ARTHROSCOPY Left    KNEE SURGERY Left 1990   McKay's procedure   LEFT HEART CATH AND CORONARY ANGIOGRAPHY N/A 12/27/2022   Procedure: LEFT HEART CATH AND CORONARY ANGIOGRAPHY;  Surgeon: Alwyn Pea, MD;  Location: [facility] INVASIVE CV LAB;  Service: Cardiovascular;  Laterality: N/A;   SHOULDER ARTHROSCOPY WITH OPEN ROTATOR CUFF REPAIR Right 10/01/2016   Procedure: SHOULDER ARTHROSCOPY WITH OPEN ROTATOR CUFF REPAIR;  Surgeon: Christena Flake, MD;  Location: [facility] ORS;  Service: Orthopedics;  Laterality: Right;  Shoulder Block    SINUS SURGERY WITH INSTATRAK     TONSILLECTOMY  1978   TUBAL LIGATION     Social History:  reports that she has been smoking cigarettes. She has a 17.5 pack-year smoking history. She has never used smokeless tobacco. She reports current alcohol use. She reports that she does not use drugs.  Allergies  Allergen Reactions   Cefdinir Nausea And Vomiting and Other (See Comments)    Swelling of throat.   Milk (Cow) Other (See Comments) and Anaphylaxis    Other reaction(s): OTHER  Difficulty breathing, throat swells   Prednisone     Other reaction(s): Other (See Comments) LOST 40% OF VISION IN ONE EYE  Patient reports she is allergic to "all steroids"   Acyclovir Nausea And Vomiting    Other reaction(s): Unknown   Codeine Nausea Only   Other Nausea Only    Antibiotic (oral) patient unsure which one    Family History  Problem Relation Age of Onset   Breast cancer Maternal Aunt 60    Prior to Admission medications   Medication Sig Start Date End Date Taking? Authorizing  Provider  albuterol (VENTOLIN HFA) 108 (90 Base) MCG/ACT inhaler Inhale 2 puffs into the lungs every 6 (six) hours as needed for wheezing or shortness of breath.    [provider]  amphetamine-dextroamphetamine (ADDERALL XR) 30 MG 24 hr capsule Take 30 mg by mouth daily. 02/19/14   [provider]  aspirin EC 81 MG tablet Take 1 tablet (81 mg total) by mouth daily. Swallow whole. 12/27/22 01/26/23  Ernestene Mention, MD  brimonidine (ALPHAGAN) 0.2 % ophthalmic solution Place 1 drop into the right eye 3 (three) times daily.    [provider]  busPIRone (BUSPAR) 5 MG tablet Take 5 mg by mouth 3 (three) times daily.    [provider]  Cyanocobalamin (B-12) 1000 MCG/ML KIT Inject 1 mL as directed once a week.    [provider]  desloratadine (CLARINEX) 5 MG tablet Take 5 mg by mouth daily.     [provider]  empagliflozin (JARDIANCE) 25 MG TABS tablet Take 25 mg by mouth daily.    [provider]  gabapentin (NEURONTIN) 300  MG capsule Take 600 mg by mouth at bedtime as needed (neuropathy).    [provider]  glipiZIDE (GLUCOTROL) 5 MG tablet Take 5 mg by mouth daily before breakfast. 01/02/23 01/02/24  [provider]  lisinopril (ZESTRIL) 5 MG tablet Take 1 tablet (5 mg total) by mouth daily. 12/29/22   Ernestene Mention, MD  losartan (COZAAR) 50 MG tablet Take 1 tablet by mouth daily. 01/03/23 01/03/24  [provider]  loteprednol (LOTEMAX) 0.5 % ophthalmic suspension Place 1 drop into the right eye daily.    [provider]  metFORMIN (GLUCOPHAGE-XR) 500 MG 24 hr tablet Take 1 tablet (500 mg total) by mouth daily with breakfast. Patient not taking: Reported on 01/09/2023 12/28/22   Ernestene Mention, MD  methimazole (TAPAZOLE) 5 MG tablet Take 2.5 mg by mouth at bedtime.    [provider]  metoprolol succinate (TOPROL-XL) 50 MG 24 hr tablet Take 1 tablet (50 mg total) by mouth daily. Take with or  immediately following a meal. Patient taking differently: Take 25 mg by mouth daily. Take with or immediately following a meal. 12/29/22   Kadali, Renuka A, MD  montelukast (SINGULAIR) 10 MG tablet Take 10 mg by mouth at bedtime.    [provider]  pantoprazole (PROTONIX) 40 MG tablet Take 40 mg by mouth at bedtime.    [provider]  rosuvastatin (CRESTOR) 20 MG tablet Take 20 mg by mouth daily. 01/03/23 01/03/24  [provider]  ticagrelor (BRILINTA) 90 MG TABS tablet Take 1 tablet (90 mg total) by mouth 2 (two) times daily. 12/27/22   Ernestene Mention, MD  timolol (BETIMOL) 0.5 % ophthalmic solution Place 1 drop into the right eye 2 (two) times daily.    [provider]  venlafaxine XR (EFFEXOR-XR) 75 MG 24 hr capsule Take 225 mg by mouth daily with breakfast.    [provider]    Physical Exam: Vitals:   01/20/23 1100 01/20/23 1130 01/20/23 1200 01/20/23 1230  BP: (!) 159/83 (!) 145/91 (!) 159/77 (!) 150/87  Pulse: 81 81 80 93  Resp: 13 17 19 17   Temp:    98.4 F (36.9 C)  TempSrc:      SpO2: 100% 100% 100% 100%  Weight:      Height:       Physical Exam Constitutional:      Appearance: She is normal weight.  HENT:     Head: Normocephalic and atraumatic.     Nose: Nose normal.     Mouth/Throat:     Mouth: Mucous membranes are moist.  Cardiovascular:     Rate and Rhythm: Normal rate and regular rhythm.  Pulmonary:     Effort: Pulmonary effort is normal.  Abdominal:     General: Bowel sounds are normal.  Musculoskeletal:        General: Normal range of motion.  Skin:    Coloration: Skin is jaundiced.  Neurological:     General: No focal deficit present.  Psychiatric:        Mood and Affect: Mood normal.     Data Reviewed:   There are no new results to review at this time.  MR 3D Recon At Scanner CLINICAL DATA:  57 year old female with history of right upper quadrant abdominal pain and epigastric pain with jaundice.  Nausea and vomiting. Elevated liver function tests.  EXAM: MRI ABDOMEN WITHOUT AND WITH CONTRAST (INCLUDING MRCP)  TECHNIQUE: Multiplanar multisequence MR imaging of the abdomen was performed  both before and after the administration of intravenous contrast. Heavily T2-weighted images of the biliary and pancreatic ducts were obtained, and three-dimensional MRCP images were rendered by post processing.  CONTRAST:  7.38mL GADAVIST GADOBUTROL 1 MMOL/ML IV SOLN  COMPARISON:  No prior abdominal MRI.  FINDINGS: Comment: Portions of today's examination are limited by substantial patient respiratory motion.  Lower chest: Unremarkable.  Hepatobiliary: Mild diffuse loss of signal intensity throughout the hepatic parenchyma on out of phase dual echo images, indicative of a background of hepatic steatosis. Status post cholecystectomy. No suspicious cystic or solid hepatic lesions are confidently identified on today's motion limited examination. Status post cholecystectomy. MRCP images demonstrate moderate intra and extrahepatic biliary ductal dilatation. Proximal common bile duct measures 12 mm in diameter. There is complete obscuration of the mid common bile duct secondary to a mass in the head of the pancreas (discussed below). Distal common bile duct is 6 mm. No filling defect in the common bile duct to suggest choledocholithiasis.  Pancreas: In the head of the pancreas (axial image 55 of series 28 and coronal image 44 of series 30) there is a 2.5 x 2.4 x 2.4 cm hypovascular mass which is low T1 signal intensity, slightly high T2 signal intensity and demonstrates low-level diffusion restriction. This is intimately associated with the mid common bile duct which appears to occlude or nearly completely occlude. Additionally, this appears to occlude the main pancreatic duct which is dilated throughout the body and tail of the pancreas measuring up to 5 mm in diameter on MRCP images. Mild  atrophy throughout the body and tail of the pancreas. No peripancreatic fluid collections or inflammatory changes are noted.  Spleen:  Unremarkable.  Adrenals/Urinary Tract: In the anterior aspect of the upper pole of the left kidney (axial image 21 of series 12) there is a subtle lesion which is largely obscured by motion on many of the pulse sequences, but appears to be isointense on T1 weighted images, slightly T2 hypointense, and appears to be hypovascular with possible internal architecture, considered indeterminate. Right kidney and bilateral adrenal glands are otherwise normal in appearance. No hydroureteronephrosis.  Stomach/Bowel: Visualized portions are unremarkable.  Vascular/Lymphatic: Superior mesenteric vein, splenic vein and splenoportal confluence are poorly demonstrated on today's motion limited examination but appear substantially narrowed by the pancreatic head mass, remaining grossly patent at this time. There is a fat plane between the lesion and the superior mesenteric artery. Accurate assessment of branches of the celiac axis in relationship to the pancreatic head mass is not possible on today's examination. No aneurysm identified in the visualized abdominal vasculature. Prominent lymph nodes adjacent to the hepatic hilum best appreciated on diffusion-weighted images, where the largest of these measures up to 1.3 cm in short axis in the portacaval nodal station (axial image 77 of series 13). No other definite lymphadenopathy noted in the abdomen.  Other: No significant volume of ascites noted in the visualized portions of the peritoneal cavity.  Musculoskeletal: No aggressive appearing osseous lesions are noted in the visualized portions of the skeleton.  IMPRESSION: 1. 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 Several prominent borderline enlarged lymph nodes in the upper abdomen are nonspecific, but suspicious for potential nodal metastasis. 2. Indeterminate lesion in the anterior aspect of the upper pole of the left kidney, suspicious for potential papillary renal cell carcinoma. Close attention  on any future follow-up studies is recommended. Prior to any future MR imaging, the patient should be reminded to adequately hold her breath during the examination to ensure diagnostic image quality. 3. Hepatic steatosis.  Electronically Signed   By: Trudie Reed M.D.   On: 01/20/2023 10:32 MR ABDOMEN MRCP W WO CONTAST CLINICAL DATA:  57 year old female with history of right upper quadrant abdominal pain and epigastric pain with jaundice. Nausea and vomiting. Elevated liver function tests.  EXAM: MRI ABDOMEN WITHOUT AND WITH CONTRAST (INCLUDING MRCP)  TECHNIQUE: Multiplanar multisequence MR imaging of the abdomen was performed both before and after the administration of intravenous contrast. Heavily T2-weighted images of the biliary and pancreatic ducts were obtained, and three-dimensional MRCP images were rendered by post processing.  CONTRAST:  7.80mL GADAVIST GADOBUTROL 1 MMOL/ML IV SOLN  COMPARISON:  No prior abdominal MRI.  FINDINGS: Comment: Portions of today's examination are limited by substantial patient respiratory motion.  Lower chest: Unremarkable.  Hepatobiliary: Mild diffuse loss of signal intensity throughout the hepatic parenchyma on out of phase dual echo images, indicative of a background of hepatic steatosis. Status post cholecystectomy. No suspicious cystic or solid hepatic lesions are confidently identified on today's motion limited examination. Status post cholecystectomy. MRCP images demonstrate moderate intra and extrahepatic biliary ductal dilatation. Proximal common bile duct measures 12 mm in diameter.  There is complete obscuration of the mid common bile duct secondary to a mass in the head of the pancreas (discussed below). Distal common bile duct is 6 mm. No filling defect in the common bile duct to suggest choledocholithiasis.  Pancreas: In the head of the pancreas (axial image 55 of series 28 and coronal image 44 of series 30) there is a 2.5 x 2.4 x 2.4 cm hypovascular mass which is low T1 signal intensity, slightly high T2 signal intensity and demonstrates low-level diffusion restriction. This is intimately associated with the mid common bile duct which appears to occlude or nearly completely occlude. Additionally, this appears to occlude the main pancreatic duct which is dilated throughout the body and tail of the pancreas measuring up to 5 mm in diameter on MRCP images. Mild atrophy throughout the body and tail of the pancreas. No peripancreatic fluid collections or inflammatory changes are noted.  Spleen:  Unremarkable.  Adrenals/Urinary Tract: In the anterior aspect of the upper pole of the left kidney (axial image 21 of series 12) there is a subtle lesion which is largely obscured by motion on many of the pulse sequences, but appears to be isointense on T1 weighted images, slightly T2 hypointense, and appears to be hypovascular with possible internal architecture, considered indeterminate. Right kidney and bilateral adrenal glands are otherwise normal in appearance. No hydroureteronephrosis.  Stomach/Bowel: Visualized portions are unremarkable.  Vascular/Lymphatic: Superior mesenteric vein, splenic vein and splenoportal confluence are poorly demonstrated on today's motion limited examination but appear substantially narrowed by the pancreatic head mass, remaining grossly patent at this time. There is a fat plane between the lesion and the superior mesenteric artery. Accurate assessment of branches of the celiac axis in relationship to the pancreatic head mass is not  possible on today's examination. No aneurysm identified in the visualized abdominal vasculature. Prominent lymph nodes adjacent to the hepatic hilum best appreciated on diffusion-weighted images, where the largest of these measures up to 1.3 cm in short axis in the portacaval nodal station (axial image 77 of series 13). No other definite lymphadenopathy noted in the abdomen.  Other: No significant volume of ascites noted  in the visualized portions of the peritoneal cavity.  Musculoskeletal: No aggressive appearing osseous lesions are noted in the visualized portions of the skeleton.  IMPRESSION: 1. 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 Several prominent borderline enlarged lymph nodes in the upper abdomen are nonspecific, but suspicious for potential nodal metastasis. 2. Indeterminate lesion in the anterior aspect of the upper pole of the left kidney, suspicious for potential papillary renal cell carcinoma. Close attention on any future follow-up studies is recommended. Prior to any future MR imaging, the patient should be reminded to adequately hold her breath during the examination to ensure diagnostic image quality. 3. Hepatic steatosis.  Electronically Signed   By: Trudie Reed M.D.   On: 01/20/2023 10:32  Lab Results  Component Value Date   WBC 9.5 01/20/2023   HGB 15.1 (H) 01/20/2023   HCT 44.4 01/20/2023   MCV 89.7 01/20/2023   PLT 314 01/20/2023   Last metabolic panel Lab Results  Component Value Date   GLUCOSE 356 (H) 01/20/2023   NA 131 (L) 01/20/2023   K 4.6 01/20/2023   CL 97 (L) 01/20/2023   CO2 21 (L) 01/20/2023   BUN 14 01/20/2023   CREATININE 0.34 (L) 01/20/2023   GFRNONAA >60 01/20/2023   CALCIUM 10.1 01/20/2023   PHOS 4.5 12/28/2022   PROT 7.7 01/20/2023    ALBUMIN 4.3 01/20/2023   LABGLOB 2.3 01/29/2017   AGRATIO 2.1 01/29/2017   BILITOT 14.4 (H) 01/20/2023   ALKPHOS 640 (H) 01/20/2023   AST 364 (H) 01/20/2023   ALT 546 (H) 01/20/2023   ANIONGAP 13 01/20/2023      Family Communication: Family/friend at the bedside Primary team communication: Plan of care discussed w/ Dr Modesto Charon. Dr. Timothy Lasso discussed decision for transfer.  Thank you very much for involving [place] in the care of your patient.  Author: Floydene Flock, MD 01/20/2023 12:45 PM  For on call review [email or web address].

## 2023-01-20 NOTE — ED Notes (Signed)
[facility]  transfer  center  called per  Dr  Modesto Charon  MD

## 2023-01-20 NOTE — Assessment & Plan Note (Addendum)
BP stable at present

## 2023-01-22 ENCOUNTER — Ambulatory Visit: Payer: BC Managed Care – PPO

## 2023-01-22 LAB — CA 19-9 (SERIAL): CA 19-9: <2 U/mL (ref 0–35)

## 2023-01-28 ENCOUNTER — Other Ambulatory Visit: Payer: BC Managed Care – PPO

## 2023-02-21 ENCOUNTER — Telehealth: Payer: Self-pay

## 2023-02-21 NOTE — Telephone Encounter (Signed)
Referral was received from Dr. Johny Blamer on 02/10/2023 for pancreatic cancer to receive chemotherapy locally. Ms. Mines has been hospitalized since 02/07/2023. Referral has continued to be held.

## 2023-02-24 ENCOUNTER — Telehealth: Payer: Self-pay

## 2023-02-24 NOTE — Telephone Encounter (Signed)
Caitlyn Roth has been discharged from the hospital. She will be contacted for scheduling. She has scheduled follow up with Dr's Mettu/Nussbaum in January 2025.

## 2023-02-25 ENCOUNTER — Encounter: Payer: Self-pay | Admitting: Oncology

## 2023-02-25 ENCOUNTER — Inpatient Hospital Stay: Payer: BC Managed Care – PPO | Attending: Oncology | Admitting: Oncology

## 2023-02-25 ENCOUNTER — Inpatient Hospital Stay: Payer: BC Managed Care – PPO

## 2023-02-25 VITALS — BP 149/84 | HR 77 | Temp 96.3°F | Resp 18 | Ht 69.0 in | Wt 150.7 lb

## 2023-02-25 DIAGNOSIS — Z7189 Other specified counseling: Secondary | ICD-10-CM | POA: Diagnosis not present

## 2023-02-25 DIAGNOSIS — Z79899 Other long term (current) drug therapy: Secondary | ICD-10-CM | POA: Diagnosis not present

## 2023-02-25 DIAGNOSIS — C259 Malignant neoplasm of pancreas, unspecified: Secondary | ICD-10-CM

## 2023-02-25 DIAGNOSIS — C250 Malignant neoplasm of head of pancreas: Secondary | ICD-10-CM | POA: Diagnosis present

## 2023-02-25 DIAGNOSIS — Z5111 Encounter for antineoplastic chemotherapy: Secondary | ICD-10-CM | POA: Insufficient documentation

## 2023-02-26 ENCOUNTER — Encounter: Payer: Self-pay | Admitting: Oncology

## 2023-02-26 ENCOUNTER — Telehealth: Payer: Self-pay | Admitting: *Deleted

## 2023-02-26 DIAGNOSIS — C259 Malignant neoplasm of pancreas, unspecified: Secondary | ICD-10-CM

## 2023-02-26 MED ORDER — ONDANSETRON HCL 8 MG PO TABS: 8.0000 mg | ORAL_TABLET | Freq: Three times a day (TID) | ORAL | 1 refills | Status: DC | PRN

## 2023-02-26 MED ORDER — LIDOCAINE-PRILOCAINE 2.5-2.5 % EX CREA: TOPICAL_CREAM | CUTANEOUS | 3 refills | Status: DC

## 2023-02-26 MED ORDER — PROCHLORPERAZINE MALEATE 10 MG PO TABS: 10.0000 mg | ORAL_TABLET | Freq: Four times a day (QID) | ORAL | 1 refills | Status: DC | PRN

## 2023-02-26 NOTE — Telephone Encounter (Signed)
Medical I called Caitlyn Roth today about an appointment for her Port-A-Cath.  Patient had wanted a early appointment because she does not want to be n.p.o. only just 8 hours.  She is scheduled on 10/15 with arrival at 7:30 and 8:30 appointment. She needs driver to take her back home. Pt agreeable with this

## 2023-02-26 NOTE — Progress Notes (Signed)
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

## 2023-02-27 ENCOUNTER — Inpatient Hospital Stay: Payer: BC Managed Care – PPO

## 2023-02-27 ENCOUNTER — Other Ambulatory Visit: Payer: Self-pay

## 2023-03-03 ENCOUNTER — Encounter: Payer: Self-pay | Admitting: Oncology

## 2023-03-03 ENCOUNTER — Other Ambulatory Visit: Payer: Self-pay | Admitting: Student

## 2023-03-03 DIAGNOSIS — Z01812 Encounter for preprocedural laboratory examination: Secondary | ICD-10-CM

## 2023-03-03 NOTE — Progress Notes (Signed)
Hematology/Oncology Consult note [facility] [phone] [phone]  Patient Care Team: Patrice Paradise, MD as PCP - General (Physician Assistant)   Name of the patient: Caitlyn Roth  [id number]  11/17/1965    Reason for referral-new diagnosis of pancreatic cancer   Referring physician-Dr. Delton Coombes Mettu  Date of visit: 03/03/23   History of presenting illness- Patient is a 57 year old female who presented with symptoms of nausea jaundice and epigastric pain to [facility] on 01/20/2023 and found to have abnormal LFTs with pancreatic head mass.  She was transferred to [facility] due to unavailability of ERCP.  MRI abdomen on 01/20/2023 showed hypervascular mass in the head of the pancreas associated with dilatation of pancreatic and common bile duct concerning for primary pancreatic neoplasm.  Borderline enlarged lymph nodes which are suspicious for nodal metastatic disease.  T2 hypointense lesion in the upper pole of the left kidney.  CA 19-9 was less than 4.  She underwent ERCP and cholecystectomy.  CBD was noted to have stenosis.  She also underwent EUS at the same time and brushings were consistent with adenocarcinoma.  CT chest showed subcentimeter lung nodules which were nonspecific.  CT pancreatic mass protocol at [facility] showed pancreatic head mass measuring 3 x 2.5 cm celiac axis not involved.  Common hepatic artery long segment less than 180 degrees abutment.  Superior mesenteric artery not involved.  Portal vein/superior mesenteric vein greater than 180 encasement with short segment distortion of the postsplenic confluence.  Splenic vein patent.  She subsequently had ERCP induced pancreatitis and was admitted to [facility] and briefly required tube feeds which was subsequently discontinued.  She was seen by Dr. Gwenlyn Perking from surgical oncology at [facility] and she was not deemed to be an upfront surgical candidate.  She also met with medical oncology Dr. Johny Blamer who  recommended neoadjuvant gemcitabine Abraxane chemotherapy.  She has recently had CAD, h/o DES to RCA on 12/27/22 and therefore modified FOLFIRINOX chemotherapy was not recommended.  Currently patient is doing well posthospitalization.  Bowel movements are regular and she denies any abdominal pain.  She has been drinking boost as well as trying to eat regular food.  ECOG PS- 1  Pain scale- 0   Review of systems- Review of Systems  Constitutional:  Positive for malaise/fatigue. Negative for chills, fever and weight loss.  HENT:  Negative for congestion, ear discharge and nosebleeds.   Eyes:  Negative for blurred vision.  Respiratory:  Negative for cough, hemoptysis, sputum production, shortness of breath and wheezing.   Cardiovascular:  Negative for chest pain, palpitations, orthopnea and claudication.  Gastrointestinal:  Negative for abdominal pain, blood in stool, constipation, diarrhea, heartburn, melena, nausea and vomiting.  Genitourinary:  Negative for dysuria, flank pain, frequency, hematuria and urgency.  Musculoskeletal:  Negative for back pain, joint pain and myalgias.  Skin:  Negative for rash.  Neurological:  Negative for dizziness, tingling, focal weakness, seizures, weakness and headaches.  Endo/Heme/Allergies:  Does not bruise/bleed easily.  Psychiatric/Behavioral:  Negative for depression and suicidal ideas. The patient does not have insomnia.     Allergies  Allergen Reactions   Cefdinir Nausea And Vomiting and Other (See Comments)    Swelling of throat.   Milk (Cow) Other (See Comments) and Anaphylaxis    Other reaction(s): OTHER  Difficulty breathing, throat swells   Prednisone     Other reaction(s): Other (See Comments) LOST 40% OF VISION IN ONE EYE  Patient reports she is allergic to "all steroids"  Acyclovir Nausea And Vomiting    Other reaction(s): Unknown   Codeine Nausea Only   Other Nausea Only    Antibiotic (oral) patient unsure which one    Patient  Active Problem List   Diagnosis Date Noted   Pancreatic adenocarcinoma (HCC) 02/26/2023   Jaundice 01/20/2023   Uncontrolled type 2 diabetes mellitus with hyperglycemia, without long-term current use of insulin (HCC) 12/27/2022   Anxiety and depression 12/27/2022   GERD without esophagitis 12/27/2022   Primary osteoarthritis of first carpometacarpal joint of left hand 08/08/2022   Graves' disease 12/28/2018   Tobacco dependence 12/28/2018   Uncontrolled type 2 diabetes mellitus with hyperglycemia (HCC) 12/28/2018   Mixed hyperlipidemia 01/29/2017   Vitamin D deficiency 01/29/2017   ADHD (attention deficit hyperactivity disorder) 01/15/2017   Biceps tendinitis of right upper extremity 08/02/2016   Complete tear of right rotator cuff 08/02/2016   Rotator cuff tendinitis, right 08/02/2016   CAD (coronary artery disease) 02/07/2015   Cerebral infarction (HCC) 11/03/2014   Eczema 11/03/2014   Hypertension 11/03/2014   Hyperthyroidism 11/03/2014   Migraine 11/03/2014   Hypercholesterolemia 11/11/2007   Allergic rhinitis 08/12/2007   Barrett's esophagus without dysplasia 08/12/2007   Gastro-esophageal reflux disease without esophagitis 08/12/2007     Past Medical History:  Diagnosis Date   A-fib [facility])    a.) CHA2DS2VASc = 6 (sex, HTN, CVA/TIA x2,  vascular disease history, T2DM);  b.) rate/rhythm maintained on oral metoprolol succinate; no chronic anticoagulation   Acute ST elevation myocardial infarction (STEMI) of inferior wall (HCC) 01/28/2015   a.) LHC/PCI 01/28/2015 --> 70% pRCA (3.5 x 20 mm Promus Premier)   ADHD (attention deficit hyperactivity disorder)    a.) on amphetamine-dextroamphetamine   Anxiety    Arthritis    Asthma    CAD (coronary artery disease) 01/28/2015   a.) LHC/PCI 01/28/2015 --> 70% pRCA (3.5 x 20 mm Promus Premier DES)   Cancer (HCC)    Diabetic polyneuropathy (HCC)    Diastolic dysfunction 01/28/2015   a.) TTE 01/28/2015: EF 50, inf/post HK, mild  BAE, triv MR/TR, G1DD   Eczema    Family history of adverse reaction to anesthesia    a.) delayed emergence in 1st degree relative (father)   [name] corneal dystrophy    GERD (gastroesophageal reflux disease)    Graves disease    a.) on methimazole   Hepatic steatosis    Hyperlipemia    Hypertension    Migraines    Right rotator cuff tendinitis    Sleep apnea    Stroke (HCC) 2009   peripheral vision in left eye has been affected,   T2DM (type 2 diabetes mellitus) (HCC)    Vitamin B 12 deficiency      Past Surgical History:  Procedure Laterality Date   CARPOMETACARPAL (CMC) FUSION OF THUMB Left 08/07/2022   Procedure: LEFT THUMB CMC ARTHROPLASTY;  Surgeon: Christena Flake, MD;  Location: [facility] ORS;  Service: Orthopedics;  Laterality: Left;   CESAREAN SECTION     CHOLECYSTECTOMY     CORONARY ANGIOPLASTY WITH STENT PLACEMENT Left 01/28/2015   Procedure: CORONARY ANGIOPLASTY WITH STENT PLACEMENT; Location: [facility]   CORONARY/GRAFT ACUTE MI REVASCULARIZATION N/A 12/27/2022   Procedure: Coronary/Graft Acute MI Revascularization;  Surgeon: Alwyn Pea, MD;  Location: [facility] INVASIVE CV LAB;  Service: Cardiovascular;  Laterality: N/A;   KNEE ARTHROSCOPY Left    KNEE SURGERY Left 1990   McKay's procedure   LEFT HEART CATH AND CORONARY ANGIOGRAPHY N/A 12/27/2022   Procedure: LEFT  HEART CATH AND CORONARY ANGIOGRAPHY;  Surgeon: Alwyn Pea, MD;  Location: [facility] INVASIVE CV LAB;  Service: Cardiovascular;  Laterality: N/A;   SHOULDER ARTHROSCOPY WITH OPEN ROTATOR CUFF REPAIR Right 10/01/2016   Procedure: SHOULDER ARTHROSCOPY WITH OPEN ROTATOR CUFF REPAIR;  Surgeon: Christena Flake, MD;  Location: [facility] ORS;  Service: Orthopedics;  Laterality: Right;  Shoulder Block    SINUS SURGERY WITH INSTATRAK     TONSILLECTOMY  1978   TUBAL LIGATION      Social History   Socioeconomic History   Marital status: Widowed    Spouse name: Not on file   Number of children: Not on file   Years of  education: Not on file   Highest education level: Not on file  Occupational History   Not on file  Tobacco Use   Smoking status: Every Day    Current packs/day: 0.50    Average packs/day: 0.5 packs/day for 35.0 years (17.5 ttl pk-yrs)    Types: Cigarettes   Smokeless tobacco: Never  Vaping Use   Vaping status: Never Used  Substance and Sexual Activity   Alcohol use: Yes    Comment: rare socially   Drug use: No   Sexual activity: Yes  Other Topics Concern   Not on file  Social History Narrative   Not on file   Social Determinants of Health   Financial Resource Strain: Low Risk  (01/01/2023)   Received from [facility]   Overall Financial Resource Strain (CARDIA)    Difficulty of Paying Living Expenses: Not hard at all  Food Insecurity: No Food Insecurity (02/25/2023)   Hunger Vital Sign    Worried About Running Out of Food in the Last Year: Never true    Ran Out of Food in the Last Year: Never true  Transportation Needs: No Transportation Needs (02/25/2023)   PRAPARE - Administrator, Civil Service (Medical): No    Lack of Transportation (Non-Medical): No  Physical Activity: Not on file  Stress: Not on file  Social Connections: Not on file  Intimate Partner Violence: Not At Risk (02/25/2023)   Humiliation, Afraid, Rape, and Kick questionnaire    Fear of Current or Ex-Partner: No    Emotionally Abused: No    Physically Abused: No    Sexually Abused: No     Family History  Problem Relation Age of Onset   Breast cancer Maternal Aunt 60     Current Outpatient Medications:    albuterol (VENTOLIN HFA) 108 (90 Base) MCG/ACT inhaler, Inhale 2 puffs into the lungs every 6 (six) hours as needed for wheezing or shortness of breath., Disp: , Rfl:    amLODipine (NORVASC) 10 MG tablet, Take 1 tablet by mouth daily., Disp: , Rfl:    amphetamine-dextroamphetamine (ADDERALL XR) 30 MG 24 hr capsule, Take 30 mg by mouth daily., Disp: , Rfl:    brimonidine  (ALPHAGAN) 0.2 % ophthalmic solution, Place 1 drop into the right eye 3 (three) times daily., Disp: , Rfl:    busPIRone (BUSPAR) 5 MG tablet, Take 5 mg by mouth 3 (three) times daily., Disp: , Rfl:    clopidogrel (PLAVIX) 75 MG tablet, Take by mouth., Disp: , Rfl:    COMFORT EZ PEN NEEDLES 32G X 8 MM MISC, See admin instructions., Disp: , Rfl:    Continuous Glucose Sensor (FREESTYLE LIBRE 3 SENSOR) MISC, USE AS DIRECTED AND CHANGE EVERY 14 DAYS, Disp: , Rfl:    Cyanocobalamin (B-12) 1000 MCG/ML KIT,  Inject 1 mL as directed once a week., Disp: , Rfl:    desloratadine (CLARINEX) 5 MG tablet, Take 5 mg by mouth daily. , Disp: , Rfl:    gabapentin (NEURONTIN) 300 MG capsule, Take 600 mg by mouth at bedtime as needed (neuropathy)., Disp: , Rfl:    Insulin Glargine (BASAGLAR KWIKPEN) 100 UNIT/ML, Inject up to 40 units daily. Take as directed., Disp: , Rfl:    losartan (COZAAR) 50 MG tablet, Take 1 tablet by mouth daily., Disp: , Rfl:    loteprednol (LOTEMAX) 0.5 % ophthalmic suspension, Place 1 drop into the right eye daily., Disp: , Rfl:    methimazole (TAPAZOLE) 5 MG tablet, Take 2.5 mg by mouth at bedtime., Disp: , Rfl:    metoprolol succinate (TOPROL-XL) 50 MG 24 hr tablet, Take 1 tablet (50 mg total) by mouth daily. Take with or immediately following a meal. (Patient taking differently: Take 25 mg by mouth daily. Take with or immediately following a meal.), Disp: 30 tablet, Rfl: 1   montelukast (SINGULAIR) 10 MG tablet, Take 10 mg by mouth at bedtime., Disp: , Rfl:    pantoprazole (PROTONIX) 40 MG tablet, Take 40 mg by mouth at bedtime., Disp: , Rfl:    prochlorperazine (COMPAZINE) 10 MG tablet, Take by mouth., Disp: , Rfl:    timolol (BETIMOL) 0.5 % ophthalmic solution, Place 1 drop into the right eye 2 (two) times daily., Disp: , Rfl:    venlafaxine XR (EFFEXOR-XR) 75 MG 24 hr capsule, Take 225 mg by mouth daily with breakfast., Disp: , Rfl:    empagliflozin (JARDIANCE) 25 MG TABS tablet, Take 25  mg by mouth daily. (Patient not taking: Reported on 02/25/2023), Disp: , Rfl:    glipiZIDE (GLUCOTROL) 5 MG tablet, Take 5 mg by mouth daily before breakfast. (Patient not taking: Reported on 02/25/2023), Disp: , Rfl:    lidocaine-prilocaine (EMLA) cream, Apply to affected area once, Disp: 30 g, Rfl: 3   lisinopril (ZESTRIL) 5 MG tablet, Take 1 tablet (5 mg total) by mouth daily. (Patient not taking: Reported on 02/25/2023), Disp: 30 tablet, Rfl: 1   metFORMIN (GLUCOPHAGE-XR) 500 MG 24 hr tablet, Take 1 tablet (500 mg total) by mouth daily with breakfast. (Patient not taking: Reported on 01/09/2023), Disp: 30 tablet, Rfl: 1   ondansetron (ZOFRAN) 8 MG tablet, Take 1 tablet (8 mg total) by mouth every 8 (eight) hours as needed for nausea or vomiting., Disp: 30 tablet, Rfl: 1   prochlorperazine (COMPAZINE) 10 MG tablet, Take 1 tablet (10 mg total) by mouth every 6 (six) hours as needed for nausea or vomiting., Disp: 30 tablet, Rfl: 1   rosuvastatin (CRESTOR) 20 MG tablet, Take 20 mg by mouth daily. (Patient not taking: Reported on 02/25/2023), Disp: , Rfl:    ticagrelor (BRILINTA) 90 MG TABS tablet, Take 1 tablet (90 mg total) by mouth 2 (two) times daily. (Patient not taking: Reported on 02/25/2023), Disp: 60 tablet, Rfl: 0   Physical exam:  Vitals:   02/25/23 1345  BP: (!) 149/84  Pulse: 77  Resp: 18  Temp: (!) 96.3 F (35.7 C)  TempSrc: Tympanic  SpO2: 99%  Weight: 150 lb 11.2 oz (68.4 kg)  Height: 5\' 9"  (1.753 m)   Physical Exam HENT:     Mouth/Throat:     Mouth: Mucous membranes are moist.     Pharynx: Oropharynx is clear.  Eyes:     General: No scleral icterus. Cardiovascular:     Rate and Rhythm: Normal rate  and regular rhythm.     Heart sounds: Normal heart sounds.  Pulmonary:     Effort: Pulmonary effort is normal.     Breath sounds: Normal breath sounds.  Abdominal:     General: Bowel sounds are normal. There is no distension.     Palpations: Abdomen is soft.     Tenderness:  There is no abdominal tenderness.  Skin:    General: Skin is warm and dry.  Neurological:     Mental Status: She is alert and oriented to person, place, and time.           Latest Ref Rng & Units 01/20/2023    8:37 AM  CMP  Glucose 70 - 99 mg/dL 366   BUN 6 - 20 mg/dL 14   Creatinine 4.40 - 1.00 mg/dL 3.47   Sodium 425 - 956 mmol/L 131   Potassium 3.5 - 5.1 mmol/L 4.6   Chloride 98 - 111 mmol/L 97   CO2 22 - 32 mmol/L 21   Calcium 8.9 - 10.3 mg/dL 38.7   Total Protein 6.5 - 8.1 g/dL 7.7   Total Bilirubin 0.3 - 1.2 mg/dL 56.4   Alkaline Phos 38 - 126 U/L 640   AST 15 - 41 U/L 364   ALT 0 - 44 U/L 546       Latest Ref Rng & Units 01/20/2023    8:37 AM  CBC  WBC 4.0 - 10.5 K/uL 9.5   Hemoglobin 12.0 - 15.0 g/dL 33.2   Hematocrit 95.1 - 46.0 % 44.4   Platelets 150 - 400 K/uL 314       No results found.  Assessment and plan- Patient is a 57 y.o. female with newly diagnosed pancreatic adenocarcinoma borderline resectable stage IIb T2 N1 M0 here to discuss further management  I have reviewed workup done in [facility] so far as well as recommendations by surgical and medical oncology at [facility].  Patient has a 3 cm pancreatic head mass with involvement of superior mesenteric vein greater than 180 degrees as well as less than 180 degree abutment of the common hepatic artery.  There were small peripancreatic nodes which were concerning for metastatic disease.  She has been deemed to be borderline resectable at this time.  Given her recent history of ischemic heart disease and stent placement she was not deemed to be a candidate for modified FOLFIRINOX chemotherapy.  I therefore recommend neoadjuvant gemcitabine Abraxane chemotherapy.  Gemcitabine will be given at 1000 mg/m along with Abraxane at 125 mg/m 3 weeks on 1 week off if she can tolerate it.  If she is unable to tolerate this dose I will switch her to 2 weeks on and 1 week off regimen.  Discussed risks and benefits of chemotherapy  including all but not limited to nausea, vomiting, low blood counts, risk of infections and hospitalizations.  Risk of peripheral neuropathy associated with Abraxane.     The multicenter, phase II NEOLAP trial initially administered two initial courses of neoadjuvant gemcitabine/nabpaclitaxel, and then randomly assigned the 130 who had neither disease progression nor unacceptable adverse effects to two additional courses of g?m?itabine/??b??clit?xel or two months of F??F?R??O? [47]. Secondary resectability was assessed by s?rgi?al exploration in all patients achieving stable disease or an objective response to neoadjuvant ?h?m?thera??. In a preliminary report presented at the 2019 European Society for Medical Oncology (ESMO) Congress, sequential F??F?R??O? was not significantly superior with regard to ??rgic?l exploration (approximately 63 percent in each group) or rate of complete macroscopic  tumor resection (R0 or microscopically positive [R1] margins, 45 versus 31 percent, odds ratio 0.54, 95% CI 0.26-1.13).  We will plan to give her 3 cycles followed by repeat scans either here at [facility] versus tube to see if she would be a surgical candidate at that time.  We will plan for port placement and chemo teach and she will tentatively start treatment in about 10 days time.  Treatment will be given with a curative intent  I will discuss referral to nutrition at my next visit   Cancer Staging  Pancreatic adenocarcinoma [facility]) Staging form: Exocrine Pancreas, AJCC 8th Edition - Clinical stage from 02/25/2023: Stage IIB (cT2, cN1, cM0) - Signed by Creig Hines, MD on 02/26/2023     Thank you for this kind referral and the opportunity to participate in the care of this  Patient   Visit Diagnosis 1. Pancreatic adenocarcinoma (HCC)   2. Goals of care, counseling/discussion     Dr. Owens Shark, MD, MPH [facility] at [facility] [phone] 03/03/2023

## 2023-03-03 NOTE — Progress Notes (Signed)
Patient for IR Port Insertion on Tues 03/04/2023, I called and LVM for the patient on the phone and gave pre-procedure instructions. VM made pt aware to be here at 7:30a, NPO after MN prior to procedure as well as driver post procedure/recovery/discharge.  Called 03/03/2023

## 2023-03-04 ENCOUNTER — Encounter: Payer: Self-pay | Admitting: Radiology

## 2023-03-04 ENCOUNTER — Ambulatory Visit
Admission: RE | Admit: 2023-03-04 | Discharge: 2023-03-04 | Disposition: A | Discharge status: Home / Self Care | Payer: BC Managed Care – PPO | Source: Ambulatory Visit | Attending: Oncology | Admitting: Oncology

## 2023-03-04 ENCOUNTER — Other Ambulatory Visit: Payer: Self-pay

## 2023-03-04 DIAGNOSIS — I2510 Atherosclerotic heart disease of native coronary artery without angina pectoris: Secondary | ICD-10-CM | POA: Diagnosis not present

## 2023-03-04 DIAGNOSIS — I252 Old myocardial infarction: Secondary | ICD-10-CM | POA: Insufficient documentation

## 2023-03-04 DIAGNOSIS — I4891 Unspecified atrial fibrillation: Secondary | ICD-10-CM | POA: Diagnosis not present

## 2023-03-04 DIAGNOSIS — K219 Gastro-esophageal reflux disease without esophagitis: Secondary | ICD-10-CM | POA: Diagnosis not present

## 2023-03-04 DIAGNOSIS — F17210 Nicotine dependence, cigarettes, uncomplicated: Secondary | ICD-10-CM | POA: Insufficient documentation

## 2023-03-04 DIAGNOSIS — Z7984 Long term (current) use of oral hypoglycemic drugs: Secondary | ICD-10-CM | POA: Diagnosis not present

## 2023-03-04 DIAGNOSIS — E119 Type 2 diabetes mellitus without complications: Secondary | ICD-10-CM | POA: Diagnosis not present

## 2023-03-04 DIAGNOSIS — J45909 Unspecified asthma, uncomplicated: Secondary | ICD-10-CM | POA: Diagnosis not present

## 2023-03-04 DIAGNOSIS — Z794 Long term (current) use of insulin: Secondary | ICD-10-CM | POA: Diagnosis not present

## 2023-03-04 DIAGNOSIS — E785 Hyperlipidemia, unspecified: Secondary | ICD-10-CM | POA: Diagnosis not present

## 2023-03-04 DIAGNOSIS — Z01812 Encounter for preprocedural laboratory examination: Secondary | ICD-10-CM

## 2023-03-04 DIAGNOSIS — I10 Essential (primary) hypertension: Secondary | ICD-10-CM | POA: Diagnosis not present

## 2023-03-04 DIAGNOSIS — C259 Malignant neoplasm of pancreas, unspecified: Secondary | ICD-10-CM | POA: Insufficient documentation

## 2023-03-04 DIAGNOSIS — G4730 Sleep apnea, unspecified: Secondary | ICD-10-CM | POA: Insufficient documentation

## 2023-03-04 HISTORY — PX: IR IMAGING GUIDED PORT INSERTION: IMG5740

## 2023-03-04 HISTORY — DX: Malignant neoplasm of pancreas, unspecified: C25.9

## 2023-03-04 LAB — GLUCOSE, CAPILLARY
Glucose-Capillary: 406 mg/dL — ABNORMAL HIGH (ref 70–99)
Glucose-Capillary: 402 mg/dL — ABNORMAL HIGH (ref 70–99)

## 2023-03-04 MED ORDER — MIDAZOLAM HCL 2 MG/2ML IJ SOLN
INTRAMUSCULAR | Status: AC | PRN
Start: 2023-03-04 — End: 2023-03-04
  Administered 2023-03-04 (×2): 1 mg via INTRAVENOUS

## 2023-03-04 MED ORDER — VANCOMYCIN HCL IN DEXTROSE 1-5 GM/200ML-% IV SOLN: 1000.0000 mg | Freq: Once | INTRAVENOUS | Status: DC

## 2023-03-04 MED ORDER — HEPARIN SOD (PORK) LOCK FLUSH 100 UNIT/ML IV SOLN
INTRAVENOUS | Status: AC
  Filled 2023-03-04: qty 5

## 2023-03-04 MED ORDER — SODIUM CHLORIDE 0.9% FLUSH: 3.0000 mL | Freq: Two times a day (BID) | INTRAVENOUS | Status: DC

## 2023-03-04 MED ORDER — LIDOCAINE HCL 1 % IJ SOLN
INTRAMUSCULAR | Status: AC
  Filled 2023-03-04: qty 20

## 2023-03-04 MED ORDER — ONDANSETRON HCL 4 MG/2ML IJ SOLN: 4.0000 mg | Freq: Four times a day (QID) | INTRAMUSCULAR | Status: DC | PRN

## 2023-03-04 MED ORDER — CEFAZOLIN SODIUM-DEXTROSE 2-4 GM/100ML-% IV SOLN
INTRAVENOUS | Status: AC
  Filled 2023-03-04: qty 100

## 2023-03-04 MED ORDER — ONDANSETRON HCL 4 MG/2ML IJ SOLN
INTRAMUSCULAR | Status: AC
  Administered 2023-03-04: 4 mg via INTRAVENOUS
  Filled 2023-03-04: qty 2

## 2023-03-04 MED ORDER — HEPARIN SOD (PORK) LOCK FLUSH 100 UNIT/ML IV SOLN
500.0000 [IU] | Freq: Once | INTRAVENOUS | Status: AC
  Administered 2023-03-04: 500 [IU] via INTRAVENOUS

## 2023-03-04 MED ORDER — FENTANYL CITRATE (PF) 100 MCG/2ML IJ SOLN
INTRAMUSCULAR | Status: AC | PRN
Start: 2023-03-04 — End: 2023-03-04
  Administered 2023-03-04 (×2): 50 ug via INTRAVENOUS

## 2023-03-04 MED ORDER — FENTANYL CITRATE (PF) 100 MCG/2ML IJ SOLN
INTRAMUSCULAR | Status: AC
  Filled 2023-03-04: qty 2

## 2023-03-04 MED ORDER — SODIUM CHLORIDE 0.9 % IV SOLN: INTRAVENOUS | Status: DC

## 2023-03-04 MED ORDER — VANCOMYCIN HCL IN DEXTROSE 1-5 GM/200ML-% IV SOLN
INTRAVENOUS | Status: AC
  Filled 2023-03-04: qty 200

## 2023-03-04 MED ORDER — LIDOCAINE HCL 1 % IJ SOLN
20.0000 mL | Freq: Once | INTRAMUSCULAR | Status: AC
  Administered 2023-03-04: 17 mL via INTRADERMAL

## 2023-03-04 MED ORDER — MIDAZOLAM HCL 2 MG/2ML IJ SOLN
INTRAMUSCULAR | Status: AC
  Filled 2023-03-04: qty 2

## 2023-03-04 NOTE — Procedures (Signed)
Interventional Radiology Procedure Note  Procedure: Single Lumen Power Port Placement    Access:  Right IJ vein.  Findings: Catheter tip positioned at SVC/RA junction. Port is ready for immediate use.   Complications: None  EBL: < 10 mL  Recommendations:  - Ok to shower in 24 hours - Do not submerge for 7 days - Routine line care   Caitlyn Roth T. Fredia Sorrow, M.D Pager:  [phone]

## 2023-03-04 NOTE — Progress Notes (Signed)
Pharmacist Chemotherapy Monitoring - Initial Assessment    Anticipated start date: 03/05/23   The following has been reviewed per standard work regarding the patient's treatment regimen: The patient's diagnosis, treatment plan and drug doses, and organ/hematologic function Lab orders and baseline tests specific to treatment regimen  The treatment plan start date, drug sequencing, and pre-medications Prior authorization status  Patient's documented medication list, including drug-drug interaction screen and prescriptions for anti-emetics and supportive care specific to the treatment regimen The drug concentrations, fluid compatibility, administration routes, and timing of the medications to be used The patient's access for treatment and lifetime cumulative dose history, if applicable  The patient's medication allergies and previous infusion related reactions, if applicable   Changes made to treatment plan:  N/A  Follow up needed:  N/A   Ebony Hail, Pharm.D., CPP 03/04/2023@3 :11 PM

## 2023-03-04 NOTE — OR Nursing (Signed)
Pt vomited green bile upon arrival to specials post procedure, 4 mg zofran given. Pt reported relief of nausea within 5 minutes. Sipping diet gingerale.

## 2023-03-04 NOTE — H&P (Signed)
Chief Complaint: Patient was seen in consultation today for pancreatic cancer  Referring Physician(s): Rao,Archana C  Supervising Physician: Irish Lack  Patient Status: [facility] - Out-pt  History of Present Illness: Caitlyn Roth is a 57 y.o. female with PMH significant for atrial fibrillation, STEMI in 2016, asthma, CAD, GERD, hyperlipemia, hypertension, sleep apnea, and type 2 diabetes mellitus being seen today in relation to pancreatic cancer. Patient was recently diagnosed with pancreatic cancer following MRI abdomen on 01/20/23 and EUS with brushings consistent with pancreatic adenocarcinoma. Patient is under the care of Dr Smith Robert with Oncology service. Patient has been referred to IR for image-guided port placement.   Past Medical History:  Diagnosis Date   A-fib [facility])    a.) CHA2DS2VASc = 6 (sex, HTN, CVA/TIA x2,  vascular disease history, T2DM);  b.) rate/rhythm maintained on oral metoprolol succinate; no chronic anticoagulation   Acute ST elevation myocardial infarction (STEMI) of inferior wall (HCC) 01/28/2015   a.) LHC/PCI 01/28/2015 --> 70% pRCA (3.5 x 20 mm Promus Premier)   ADHD (attention deficit hyperactivity disorder)    a.) on amphetamine-dextroamphetamine   Anxiety    Arthritis    Asthma    CAD (coronary artery disease) 01/28/2015   a.) LHC/PCI 01/28/2015 --> 70% pRCA (3.5 x 20 mm Promus Premier DES)   Cancer (HCC)    Diabetic polyneuropathy (HCC)    Diastolic dysfunction 01/28/2015   a.) TTE 01/28/2015: EF 50, inf/post HK, mild BAE, triv MR/TR, G1DD   Eczema    Family history of adverse reaction to anesthesia    a.) delayed emergence in 1st degree relative (father)   [name] corneal dystrophy    GERD (gastroesophageal reflux disease)    Graves disease    a.) on methimazole   Hepatic steatosis    Hyperlipemia    Hypertension    Migraines    Right rotator cuff tendinitis    Sleep apnea    Stroke (HCC) 2009   peripheral vision in left eye has been  affected,   T2DM (type 2 diabetes mellitus) (HCC)    Vitamin B 12 deficiency     Past Surgical History:  Procedure Laterality Date   CARPOMETACARPAL (CMC) FUSION OF THUMB Left 08/07/2022   Procedure: LEFT THUMB CMC ARTHROPLASTY;  Surgeon: Christena Flake, MD;  Location: [facility] ORS;  Service: Orthopedics;  Laterality: Left;   CESAREAN SECTION     CHOLECYSTECTOMY     CORONARY ANGIOPLASTY WITH STENT PLACEMENT Left 01/28/2015   Procedure: CORONARY ANGIOPLASTY WITH STENT PLACEMENT; Location: [facility]   CORONARY/GRAFT ACUTE MI REVASCULARIZATION N/A 12/27/2022   Procedure: Coronary/Graft Acute MI Revascularization;  Surgeon: Alwyn Pea, MD;  Location: [facility] INVASIVE CV LAB;  Service: Cardiovascular;  Laterality: N/A;   KNEE ARTHROSCOPY Left    KNEE SURGERY Left 1990   McKay's procedure   LEFT HEART CATH AND CORONARY ANGIOGRAPHY N/A 12/27/2022   Procedure: LEFT HEART CATH AND CORONARY ANGIOGRAPHY;  Surgeon: Alwyn Pea, MD;  Location: [facility] INVASIVE CV LAB;  Service: Cardiovascular;  Laterality: N/A;   SHOULDER ARTHROSCOPY WITH OPEN ROTATOR CUFF REPAIR Right 10/01/2016   Procedure: SHOULDER ARTHROSCOPY WITH OPEN ROTATOR CUFF REPAIR;  Surgeon: Christena Flake, MD;  Location: [facility] ORS;  Service: Orthopedics;  Laterality: Right;  Shoulder Block    SINUS SURGERY WITH INSTATRAK     TONSILLECTOMY  1978   TUBAL LIGATION      Allergies: Cefdinir, Milk (cow), Prednisone, Acyclovir, Codeine, and Other  Medications: Prior to Admission medications  Medication Sig Start Date End Date Taking? Authorizing Provider  albuterol (VENTOLIN HFA) 108 (90 Base) MCG/ACT inhaler Inhale 2 puffs into the lungs every 6 (six) hours as needed for wheezing or shortness of breath.    [provider]  amLODipine (NORVASC) 10 MG tablet Take 1 tablet by mouth daily. 02/24/23 02/24/24  [provider]  amphetamine-dextroamphetamine (ADDERALL XR) 30 MG 24 hr capsule Take 30 mg by mouth daily. 02/19/14    [provider]  brimonidine (ALPHAGAN) 0.2 % ophthalmic solution Place 1 drop into the right eye 3 (three) times daily.    [provider]  busPIRone (BUSPAR) 5 MG tablet Take 5 mg by mouth 3 (three) times daily.    [provider]  clopidogrel (PLAVIX) 75 MG tablet Take by mouth. 01/17/23   [provider]  COMFORT EZ PEN NEEDLES 32G X 8 MM MISC See admin instructions. 01/09/23 01/09/24  [provider]  Continuous Glucose Sensor (FREESTYLE LIBRE 3 SENSOR) MISC USE AS DIRECTED AND CHANGE EVERY 14 DAYS 01/16/23   [provider]  Cyanocobalamin (B-12) 1000 MCG/ML KIT Inject 1 mL as directed once a week.    [provider]  desloratadine (CLARINEX) 5 MG tablet Take 5 mg by mouth daily.     [provider]  empagliflozin (JARDIANCE) 25 MG TABS tablet Take 25 mg by mouth daily. Patient not taking: Reported on 02/25/2023    [provider]  gabapentin (NEURONTIN) 300 MG capsule Take 600 mg by mouth at bedtime as needed (neuropathy).    [provider]  glipiZIDE (GLUCOTROL) 5 MG tablet Take 5 mg by mouth daily before breakfast. Patient not taking: Reported on 02/25/2023 01/02/23 01/02/24  [provider]  Insulin Glargine (BASAGLAR KWIKPEN) 100 UNIT/ML Inject up to 40 units daily. Take as directed. 01/29/23   [provider]  lidocaine-prilocaine (EMLA) cream Apply to affected area once 02/26/23   Creig Hines, MD  lisinopril (ZESTRIL) 5 MG tablet Take 1 tablet (5 mg total) by mouth daily. Patient not taking: Reported on 02/25/2023 12/29/22   Ernestene Mention, MD  losartan (COZAAR) 50 MG tablet Take 1 tablet by mouth daily. 01/03/23 01/03/24  [provider]  loteprednol (LOTEMAX) 0.5 % ophthalmic suspension Place 1 drop into the right eye daily.    [provider]  metFORMIN (GLUCOPHAGE-XR) 500 MG 24 hr tablet Take 1 tablet (500 mg total) by mouth daily with breakfast. Patient not  taking: Reported on 01/09/2023 12/28/22   Ernestene Mention, MD  methimazole (TAPAZOLE) 5 MG tablet Take 2.5 mg by mouth at bedtime.    [provider]  metoprolol succinate (TOPROL-XL) 50 MG 24 hr tablet Take 1 tablet (50 mg total) by mouth daily. Take with or immediately following a meal. Patient taking differently: Take 25 mg by mouth daily. Take with or immediately following a meal. 12/29/22   Kadali, Renuka A, MD  montelukast (SINGULAIR) 10 MG tablet Take 10 mg by mouth at bedtime.    [provider]  ondansetron (ZOFRAN) 8 MG tablet Take 1 tablet (8 mg total) by mouth every 8 (eight) hours as needed for nausea or vomiting. 02/26/23   Creig Hines, MD  pantoprazole (PROTONIX) 40 MG tablet Take 40 mg by mouth at bedtime.    [provider]  prochlorperazine (COMPAZINE) 10 MG tablet Take by mouth. 01/29/23 02/28/23  [provider]  prochlorperazine (COMPAZINE) 10 MG tablet Take 1 tablet (10 mg total) by mouth every  6 (six) hours as needed for nausea or vomiting. 02/26/23   Creig Hines, MD  rosuvastatin (CRESTOR) 20 MG tablet Take 20 mg by mouth daily. Patient not taking: Reported on 02/25/2023 01/03/23 01/03/24  [provider]  ticagrelor (BRILINTA) 90 MG TABS tablet Take 1 tablet (90 mg total) by mouth 2 (two) times daily. Patient not taking: Reported on 02/25/2023 12/27/22   Ernestene Mention, MD  timolol (BETIMOL) 0.5 % ophthalmic solution Place 1 drop into the right eye 2 (two) times daily.    [provider]  venlafaxine XR (EFFEXOR-XR) 75 MG 24 hr capsule Take 225 mg by mouth daily with breakfast.    [provider]     Family History  Problem Relation Age of Onset   Breast cancer Maternal Aunt 6    Social History   Socioeconomic History   Marital status: Widowed    Spouse name: Not on file   Number of children: Not on file   Years of education: Not on file   Highest education level: Not on file  Occupational History    Not on file  Tobacco Use   Smoking status: Every Day    Current packs/day: 0.50    Average packs/day: 0.5 packs/day for 35.0 years (17.5 ttl pk-yrs)    Types: Cigarettes   Smokeless tobacco: Never  Vaping Use   Vaping status: Never Used  Substance and Sexual Activity   Alcohol use: Yes    Comment: rare socially   Drug use: No   Sexual activity: Yes  Other Topics Concern   Not on file  Social History Narrative   Not on file   Social Determinants of Health   Financial Resource Strain: Low Risk  (01/01/2023)   Received from [facility]   Overall Financial Resource Strain (CARDIA)    Difficulty of Paying Living Expenses: Not hard at all  Food Insecurity: No Food Insecurity (02/25/2023)   Hunger Vital Sign    Worried About Running Out of Food in the Last Year: Never true    Ran Out of Food in the Last Year: Never true  Transportation Needs: No Transportation Needs (02/25/2023)   PRAPARE - Administrator, Civil Service (Medical): No    Lack of Transportation (Non-Medical): No  Physical Activity: Not on file  Stress: Not on file  Social Connections: Not on file    Code Status: Full code  Review of Systems: A 12 point ROS discussed and pertinent positives are indicated in the HPI above.  All other systems are negative.  Review of Systems  Constitutional:  Negative for chills and fever.  Respiratory:  Negative for chest tightness and shortness of breath.   Cardiovascular:  Negative for chest pain and leg swelling.  Gastrointestinal:  Positive for abdominal pain. Negative for diarrhea, nausea and vomiting.       Patient reports constant central abdominal pain  Neurological:  Negative for dizziness and headaches.  Psychiatric/Behavioral:  Negative for confusion.     Vital Signs: Pulse 84   Temp 97.8 F (36.6 C) (Oral)   Resp (!) 24   LMP 09/21/2014   SpO2 100%     Physical Exam Vitals reviewed.  Constitutional:      General: She is not  in acute distress. HENT:     Mouth/Throat:     Mouth: Mucous membranes are moist.  Cardiovascular:     Rate and Rhythm: Normal rate and regular rhythm.  Pulses: Normal pulses.     Heart sounds: Normal heart sounds.  Pulmonary:     Effort: Pulmonary effort is normal.     Breath sounds: Normal breath sounds.  Abdominal:     Palpations: Abdomen is soft.     Tenderness: There is no abdominal tenderness.  Musculoskeletal:     Right lower leg: No edema.     Left lower leg: No edema.  Skin:    General: Skin is warm and dry.  Neurological:     Mental Status: She is alert and oriented to person, place, and time.  Psychiatric:        Mood and Affect: Mood normal.        Behavior: Behavior normal.        Thought Content: Thought content normal.        Judgment: Judgment normal.     Imaging: No results found.  Labs:  CBC: Recent Labs    12/27/22 0200 12/27/22 1102 12/28/22 0426 01/20/23 0837  WBC 8.6 7.5 8.9 9.5  HGB 13.1 12.5 13.8 15.1*  HCT 37.5 34.9* 38.2 44.4  PLT 217 223 230 314    COAGS: Recent Labs    12/26/22 2354 01/20/23 1533  INR 0.9 0.9  APTT 25  --     BMP: Recent Labs    12/27/22 0200 12/27/22 1102 12/28/22 0426 01/20/23 0837  NA 129* 133* 136 131*  K 4.0 3.5 3.0* 4.6  CL 96* 102 103 97*  CO2 24 23 24  21*  GLUCOSE 415* 214* 117* 356*  BUN 11 9 9 14   CALCIUM 8.9 8.7* 9.1 10.1  CREATININE 0.61 0.62 0.53 0.34*  GFRNONAA >60 >60 >60 >60    LIVER FUNCTION TESTS: Recent Labs    12/26/22 2354 01/20/23 0837  BILITOT 0.9 14.4*  AST 16 364*  ALT 20 546*  ALKPHOS 121 640*  PROT 7.0 7.7  ALBUMIN 3.9 4.3    TUMOR MARKERS: No results for input(s): "AFPTM", "CEA", "CA199", "CHROMGRNA" in the last 8760 hours.  Assessment and Plan:  Lavra Imler is a 57 yo female being seen today in relation newly diagnosed pancreatic cancer. Patient is under the care of Dr Smith Robert, and is due to begin IV chemotherapy under her care. Patient has been  referred to IR for image-guided port placement. Case has been reviewed with Dr Fredia Sorrow. Patient presents today in their usual state of health and she is NPO. Case to proceed on 03/04/23 for outpatient image-guided port placement.  Risks and benefits of image guided port-a-catheter placement was discussed with the patient including, but not limited to bleeding, infection, pneumothorax, or fibrin sheath development and need for additional procedures.  All of the patient's questions were answered, patient is agreeable to proceed. Consent signed and in chart.   Thank you for this interesting consult.  I greatly enjoyed meeting JERRIYAH LOUIS and look forward to participating in their care.  A copy of this report was sent to the requesting provider on this date.  Electronically Signed: Kennieth Francois, PA-C 03/04/2023, 7:56 AM   I spent a total of 15 Minutes   in face to face in clinical consultation, greater than 50% of which was counseling/coordinating care for pancreatic cancer.

## 2023-03-04 NOTE — OR Nursing (Signed)
Dr Fredia Sorrow aware of FSBS greater than 400. He arrived to assess patient and ordered Vancomycin prophylactic. OK to proceed with port a cath insertion

## 2023-03-05 ENCOUNTER — Inpatient Hospital Stay: Payer: BC Managed Care – PPO

## 2023-03-05 ENCOUNTER — Telehealth: Payer: Self-pay

## 2023-03-05 ENCOUNTER — Inpatient Hospital Stay (HOSPITAL_BASED_OUTPATIENT_CLINIC_OR_DEPARTMENT_OTHER): Payer: BC Managed Care – PPO | Admitting: Oncology

## 2023-03-05 ENCOUNTER — Encounter: Payer: Self-pay | Admitting: Oncology

## 2023-03-05 VITALS — BP 125/85 | HR 83 | Temp 98.5°F | Resp 17 | Wt 142.0 lb

## 2023-03-05 DIAGNOSIS — C259 Malignant neoplasm of pancreas, unspecified: Secondary | ICD-10-CM

## 2023-03-05 DIAGNOSIS — E871 Hypo-osmolality and hyponatremia: Secondary | ICD-10-CM | POA: Diagnosis not present

## 2023-03-05 DIAGNOSIS — E1165 Type 2 diabetes mellitus with hyperglycemia: Secondary | ICD-10-CM

## 2023-03-05 DIAGNOSIS — Z5111 Encounter for antineoplastic chemotherapy: Secondary | ICD-10-CM

## 2023-03-05 LAB — CBC WITH DIFFERENTIAL (CANCER CENTER ONLY)
Abs Immature Granulocytes: 0.04 10*3/uL (ref 0.00–0.07)
Basophils Absolute: 0.1 10*3/uL (ref 0.0–0.1)
Basophils Relative: 1 %
Eosinophils Absolute: 0.3 10*3/uL (ref 0.0–0.5)
Eosinophils Relative: 4 %
HCT: 30.9 % — ABNORMAL LOW (ref 36.0–46.0)
Hemoglobin: 10.2 g/dL — ABNORMAL LOW (ref 12.0–15.0)
Immature Granulocytes: 1 %
Lymphocytes Relative: 21 %
Lymphs Abs: 1.7 10*3/uL (ref 0.7–4.0)
MCH: 29.6 pg (ref 26.0–34.0)
MCHC: 33 g/dL (ref 30.0–36.0)
MCV: 89.6 fL (ref 80.0–100.0)
Monocytes Absolute: 0.7 10*3/uL (ref 0.1–1.0)
Monocytes Relative: 9 %
Neutro Abs: 5.4 10*3/uL (ref 1.7–7.7)
Neutrophils Relative %: 64 %
Platelet Count: 255 10*3/uL (ref 150–400)
RBC: 3.45 MIL/uL — ABNORMAL LOW (ref 3.87–5.11)
RDW: 13.5 % (ref 11.5–15.5)
WBC Count: 8.3 10*3/uL (ref 4.0–10.5)
nRBC: 0 % (ref 0.0–0.2)

## 2023-03-05 LAB — CMP (CANCER CENTER ONLY)
ALT: 37 U/L (ref 0–44)
AST: 22 U/L (ref 15–41)
Albumin: 3.4 g/dL — ABNORMAL LOW (ref 3.5–5.0)
Alkaline Phosphatase: 166 U/L — ABNORMAL HIGH (ref 38–126)
Anion gap: 10 (ref 5–15)
BUN: 20 mg/dL (ref 6–20)
CO2: 24 mmol/L (ref 22–32)
Calcium: 9 mg/dL (ref 8.9–10.3)
Chloride: 93 mmol/L — ABNORMAL LOW (ref 98–111)
Creatinine: 0.88 mg/dL (ref 0.44–1.00)
GFR, Estimated: >60 mL/min (ref >60)
Glucose, Bld: 482 mg/dL — ABNORMAL HIGH (ref 70–99)
Potassium: 4 mmol/L (ref 3.5–5.1)
Sodium: 127 mmol/L — ABNORMAL LOW (ref 135–145)
Total Bilirubin: 1.3 mg/dL — ABNORMAL HIGH (ref 0.3–1.2)
Total Protein: 8.1 g/dL (ref 6.5–8.1)

## 2023-03-05 MED ORDER — HEPARIN SOD (PORK) LOCK FLUSH 100 UNIT/ML IV SOLN
500.0000 [IU] | Freq: Once | INTRAVENOUS | Status: DC | PRN
  Filled 2023-03-05: qty 5

## 2023-03-05 MED ORDER — SODIUM CHLORIDE 0.9 % IV SOLN
INTRAVENOUS | Status: DC
  Filled 2023-03-05: qty 250

## 2023-03-05 MED ORDER — PROCHLORPERAZINE MALEATE 10 MG PO TABS
10.0000 mg | ORAL_TABLET | Freq: Once | ORAL | Status: AC
  Administered 2023-03-05: 10 mg via ORAL
  Filled 2023-03-05: qty 1

## 2023-03-05 MED ORDER — SODIUM CHLORIDE 0.9 % IV SOLN
1000.0000 mg/m2 | Freq: Once | INTRAVENOUS | Status: AC
  Administered 2023-03-05: 1824 mg via INTRAVENOUS
  Filled 2023-03-05: qty 47.97

## 2023-03-05 MED ORDER — SODIUM CHLORIDE 0.9% FLUSH
10.0000 mL | INTRAVENOUS | Status: DC | PRN
  Filled 2023-03-05: qty 10

## 2023-03-05 MED ORDER — PACLITAXEL PROTEIN-BOUND CHEMO INJECTION 100 MG
100.0000 mg/m2 | Freq: Once | INTRAVENOUS | Status: AC
  Administered 2023-03-05: 200 mg via INTRAVENOUS
  Filled 2023-03-05: qty 40

## 2023-03-05 NOTE — Progress Notes (Signed)
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  She was transferred to [facility] due to unavailability of ERCP.   MRI abdomen on 01/20/2023 showed hypervascular mass in the head of the pancreas associated with dilatation of pancreatic and common bile duct concerning for primary pancreatic neoplasm.  Borderline enlarged lymph nodes which are suspicious for nodal metastatic disease.  T2 hypointense lesion in the upper pole of the left kidney.  CA 19-9 was less than 4.  She underwent ERCP and cholecystectomy.  CBD was noted to have stenosis.  She also underwent EUS at the same time and brushings were consistent with adenocarcinoma.  CT chest showed subcentimeter lung nodules which were nonspecific.  CT pancreatic mass protocol at [facility] showed pancreatic head mass measuring 3 x 2.5 cm celiac axis not involved.  Common hepatic artery long segment less than 180 degrees abutment.  Superior mesenteric artery not involved.  Portal vein/superior mesenteric vein greater than 180 encasement with short segment distortion of the postsplenic confluence.  Splenic vein patent.   She subsequently had ERCP induced pancreatitis and was admitted to [facility] and briefly required tube feeds which was subsequently discontinued.  She was seen by Dr. Gwenlyn Perking from surgical oncology at [facility] and she  was not deemed to be an upfront surgical candidate.  She also met with medical oncology Dr. Johny Blamer who recommended neoadjuvant gemcitabine Abraxane chemotherapy.  She has recently had CAD, h/o DES to RCA on 12/27/22 and therefore modified FOLFIRINOX chemotherapy was not recommended.    Interval history-blood sugars have been running high at home ranging between 400-500 on most occasions.  Patient is currently on insulin.  She was also on glipizide and Jardiance.  Jardiance was stopped during the last hospitalization.  ECOG PS- 1 Pain scale- 0 Opioid associated constipation- no  Review of systems- Review of Systems  Constitutional:  Positive for malaise/fatigue. Negative for chills, fever and weight loss.  HENT:  Negative for congestion, ear discharge and nosebleeds.   Eyes:  Negative for blurred vision.  Respiratory:  Negative for cough, hemoptysis, sputum production, shortness of breath and wheezing.   Cardiovascular:  Negative for chest pain, palpitations, orthopnea and claudication.  Gastrointestinal:  Negative for abdominal pain, blood in stool, constipation, diarrhea, heartburn, melena, nausea and vomiting.  Genitourinary:  Negative for dysuria, flank pain, frequency, hematuria and urgency.  Musculoskeletal:  Negative for back pain, joint pain and myalgias.  Skin:  Negative for rash.  Neurological:  Negative for dizziness, tingling, focal weakness, seizures, weakness and headaches.  Endo/Heme/Allergies:  Does not bruise/bleed easily.  Psychiatric/Behavioral:  Negative for depression and suicidal ideas. The patient does not have insomnia.       Allergies  Allergen Reactions   Cefdinir Nausea And Vomiting and Other (See Comments)    Swelling of throat.   Milk (Cow) Other (See Comments) and Anaphylaxis    Other reaction(s): OTHER  Difficulty breathing, throat  swells   Prednisone     Other reaction(s): Other (See Comments) LOST 40% OF VISION IN ONE EYE  Patient reports she is  allergic to "all steroids"   Acyclovir Nausea And Vomiting    Other reaction(s): Unknown   Codeine Nausea Only   Other Nausea Only    Antibiotic (oral) patient unsure which one     Past Medical History:  Diagnosis Date   A-fib (HCC)    a.) CHA2DS2VASc = 6 (sex, HTN, CVA/TIA x2,  vascular disease history, T2DM);  b.) rate/rhythm maintained on oral metoprolol succinate; no chronic anticoagulation   Acute ST elevation myocardial infarction (STEMI) of inferior wall (HCC) 01/28/2015   a.) LHC/PCI 01/28/2015 --> 70% pRCA (3.5 x 20 mm Promus Premier)   ADHD (attention deficit hyperactivity disorder)    a.) on amphetamine-dextroamphetamine   Anxiety    Arthritis    Asthma    CAD (coronary artery disease) 01/28/2015   a.) LHC/PCI 01/28/2015 --> 70% pRCA (3.5 x 20 mm Promus Premier DES)   Cancer (HCC)    Diabetic polyneuropathy (HCC)    Diastolic dysfunction 01/28/2015   a.) TTE 01/28/2015: EF 50, inf/post HK, mild BAE, triv MR/TR, G1DD   Eczema    Family history of adverse reaction to anesthesia    a.) delayed emergence in 1st degree relative (father)   [name] corneal dystrophy    GERD (gastroesophageal reflux disease)    Graves disease    a.) on methimazole   Hepatic steatosis    Hyperlipemia    Hypertension    Migraines    Pancreatic cancer (HCC)    Right rotator cuff tendinitis    Sleep apnea    Stroke (HCC) 2009   peripheral vision in left eye has been affected,   T2DM (type 2 diabetes mellitus) (HCC)    Vitamin B 12 deficiency      Past Surgical History:  Procedure Laterality Date   CARPOMETACARPAL (CMC) FUSION OF THUMB Left 08/07/2022   Procedure: LEFT THUMB CMC ARTHROPLASTY;  Surgeon: Christena Flake, MD;  Location: [facility] ORS;  Service: Orthopedics;  Laterality: Left;   CESAREAN SECTION     CHOLECYSTECTOMY     CORONARY ANGIOPLASTY WITH STENT PLACEMENT Left 01/28/2015   Procedure: CORONARY ANGIOPLASTY WITH STENT PLACEMENT; Location: [facility]   CORONARY/GRAFT ACUTE MI  REVASCULARIZATION N/A 12/27/2022   Procedure: Coronary/Graft Acute MI Revascularization;  Surgeon: Alwyn Pea, MD;  Location: [facility] INVASIVE CV LAB;  Service: Cardiovascular;  Laterality: N/A;   IR IMAGING GUIDED PORT INSERTION  03/04/2023   KNEE ARTHROSCOPY Left    KNEE SURGERY Left 1990   McKay's procedure   LEFT HEART CATH AND CORONARY ANGIOGRAPHY N/A 12/27/2022   Procedure: LEFT HEART CATH AND CORONARY ANGIOGRAPHY;  Surgeon: Alwyn Pea, MD;  Location: [facility] INVASIVE CV LAB;  Service: Cardiovascular;  Laterality: N/A;   SHOULDER ARTHROSCOPY WITH OPEN ROTATOR CUFF REPAIR Right 10/01/2016   Procedure: SHOULDER ARTHROSCOPY WITH OPEN ROTATOR CUFF REPAIR;  Surgeon: Christena Flake, MD;  Location: [facility] ORS;  Service: Orthopedics;  Laterality: Right;  Shoulder Block    SINUS SURGERY WITH INSTATRAK     TONSILLECTOMY  1978   TUBAL LIGATION      Social History   Socioeconomic History   Marital status: Widowed    Spouse name: Not on file   Number of children: Not on file   Years of education: Not on file   Highest education level: Not on file  Occupational History   Not  on file  Tobacco Use   Smoking status: Every Day    Current packs/day: 0.50    Average packs/day: 0.5 packs/day for 35.0 years (17.5 ttl pk-yrs)    Types: Cigarettes   Smokeless tobacco: Never  Vaping Use   Vaping status: Never Used  Substance and Sexual Activity   Alcohol use: Yes    Comment: rare socially   Drug use: No   Sexual activity: Yes  Other Topics Concern   Not on file  Social History Narrative   Not on file   Social Determinants of Health   Financial Resource Strain: Low Risk  (01/01/2023)   Received from [facility]   Overall Financial Resource Strain (CARDIA)    Difficulty of Paying Living Expenses: Not hard at all  Food Insecurity: No Food Insecurity (02/25/2023)   Hunger Vital Sign    Worried About Running Out of Food in the Last Year: Never true    Ran Out of Food  in the Last Year: Never true  Transportation Needs: No Transportation Needs (02/25/2023)   PRAPARE - Administrator, Civil Service (Medical): No    Lack of Transportation (Non-Medical): No  Physical Activity: Not on file  Stress: Not on file  Social Connections: Not on file  Intimate Partner Violence: Not At Risk (02/25/2023)   Humiliation, Afraid, Rape, and Kick questionnaire    Fear of Current or Ex-Partner: No    Emotionally Abused: No    Physically Abused: No    Sexually Abused: No    Family History  Problem Relation Age of Onset   Breast cancer Maternal Aunt 60     Current Outpatient Medications:    albuterol (VENTOLIN HFA) 108 (90 Base) MCG/ACT inhaler, Inhale 2 puffs into the lungs every 6 (six) hours as needed for wheezing or shortness of breath., Disp: , Rfl:    amLODipine (NORVASC) 10 MG tablet, Take 1 tablet by mouth daily., Disp: , Rfl:    amphetamine-dextroamphetamine (ADDERALL XR) 30 MG 24 hr capsule, Take 30 mg by mouth daily., Disp: , Rfl:    brimonidine (ALPHAGAN) 0.2 % ophthalmic solution, Place 1 drop into the right eye 3 (three) times daily., Disp: , Rfl:    busPIRone (BUSPAR) 5 MG tablet, Take 5 mg by mouth 3 (three) times daily., Disp: , Rfl:    clopidogrel (PLAVIX) 75 MG tablet, Take by mouth., Disp: , Rfl:    COMFORT EZ PEN NEEDLES 32G X 8 MM MISC, See admin instructions., Disp: , Rfl:    Continuous Glucose Sensor (FREESTYLE LIBRE 3 SENSOR) MISC, USE AS DIRECTED AND CHANGE EVERY 14 DAYS, Disp: , Rfl:    Cyanocobalamin (B-12) 1000 MCG/ML KIT, Inject 1 mL as directed once a week., Disp: , Rfl:    desloratadine (CLARINEX) 5 MG tablet, Take 5 mg by mouth daily. , Disp: , Rfl:    gabapentin (NEURONTIN) 300 MG capsule, Take 600 mg by mouth at bedtime as needed (neuropathy)., Disp: , Rfl:    Insulin Glargine (BASAGLAR KWIKPEN) 100 UNIT/ML, Inject up to 40 units daily. Take as directed., Disp: , Rfl:    lidocaine-prilocaine (EMLA) cream, Apply to affected  area once, Disp: 30 g, Rfl: 3   losartan (COZAAR) 50 MG tablet, Take 1 tablet by mouth daily., Disp: , Rfl:    loteprednol (LOTEMAX) 0.5 % ophthalmic suspension, Place 1 drop into the right eye daily., Disp: , Rfl:    methimazole (TAPAZOLE) 5 MG tablet, Take 2.5 mg by mouth  at bedtime., Disp: , Rfl:    metoprolol succinate (TOPROL-XL) 50 MG 24 hr tablet, Take 1 tablet (50 mg total) by mouth daily. Take with or immediately following a meal. (Patient taking differently: Take 100 mg by mouth daily. Take with or immediately following a meal.), Disp: 30 tablet, Rfl: 1   montelukast (SINGULAIR) 10 MG tablet, Take 10 mg by mouth at bedtime., Disp: , Rfl:    ondansetron (ZOFRAN) 8 MG tablet, Take 1 tablet (8 mg total) by mouth every 8 (eight) hours as needed for nausea or vomiting., Disp: 30 tablet, Rfl: 1   pantoprazole (PROTONIX) 40 MG tablet, Take 40 mg by mouth at bedtime., Disp: , Rfl:    prochlorperazine (COMPAZINE) 10 MG tablet, Take 1 tablet (10 mg total) by mouth every 6 (six) hours as needed for nausea or vomiting., Disp: 30 tablet, Rfl: 1   rosuvastatin (CRESTOR) 20 MG tablet, Take 20 mg by mouth daily., Disp: , Rfl:    ticagrelor (BRILINTA) 90 MG TABS tablet, Take 1 tablet (90 mg total) by mouth 2 (two) times daily., Disp: 60 tablet, Rfl: 0   timolol (BETIMOL) 0.5 % ophthalmic solution, Place 1 drop into the right eye 2 (two) times daily., Disp: , Rfl:    venlafaxine XR (EFFEXOR-XR) 75 MG 24 hr capsule, Take 225 mg by mouth daily with breakfast., Disp: , Rfl:    empagliflozin (JARDIANCE) 25 MG TABS tablet, Take 25 mg by mouth daily. (Patient not taking: Reported on 02/25/2023), Disp: , Rfl:    glipiZIDE (GLUCOTROL) 5 MG tablet, Take 5 mg by mouth daily before breakfast. (Patient not taking: Reported on 02/25/2023), Disp: , Rfl:    lisinopril (ZESTRIL) 5 MG tablet, Take 1 tablet (5 mg total) by mouth daily. (Patient not taking: Reported on 02/25/2023), Disp: 30 tablet, Rfl: 1   metFORMIN  (GLUCOPHAGE-XR) 500 MG 24 hr tablet, Take 1 tablet (500 mg total) by mouth daily with breakfast. (Patient not taking: Reported on 01/09/2023), Disp: 30 tablet, Rfl: 1   prochlorperazine (COMPAZINE) 10 MG tablet, Take by mouth., Disp: , Rfl:  No current facility-administered medications for this visit.  Facility-Administered Medications Ordered in Other Visits:    0.9 %  sodium chloride infusion, , Intravenous, Continuous, Creig Hines, MD, Last Rate: 10 mL/hr at 03/05/23 1225, New Bag at 03/05/23 1225   gemcitabine (GEMZAR) 1,824 mg in sodium chloride 0.9 % 250 mL chemo infusion, 1,000 mg/m2 (Treatment Plan Recorded), Intravenous, Once, Creig Hines, MD   heparin lock flush 100 unit/mL, 500 Units, Intracatheter, Once PRN, Creig Hines, MD   PACLitaxel-protein bound (ABRAXANE) chemo infusion 200 mg, 100 mg/m2 (Treatment Plan Recorded), Intravenous, Once, Creig Hines, MD   sodium chloride flush (NS) 0.9 % injection 10 mL, 10 mL, Intracatheter, PRN, Creig Hines, MD  Physical exam:  Vitals:   03/05/23 1134  BP: 125/85  Pulse: 83  Resp: 17  Temp: 98.5 F (36.9 C)  TempSrc: Oral  SpO2: 100%  Weight: 142 lb (64.4 kg)   Physical Exam Cardiovascular:     Rate and Rhythm: Normal rate and regular rhythm.     Heart sounds: Normal heart sounds.  Pulmonary:     Effort: Pulmonary effort is normal.     Breath sounds: Normal breath sounds.  Abdominal:     General: Bowel sounds are normal.     Palpations: Abdomen is soft.  Skin:    General: Skin is warm and dry.  Neurological:     Mental  Status: She is alert and oriented to person, place, and time.         Latest Ref Rng & Units 03/05/2023   11:14 AM  CMP  Glucose 70 - 99 mg/dL 782   BUN 6 - 20 mg/dL 20   Creatinine 9.56 - 1.00 mg/dL 2.13   Sodium 086 - 578 mmol/L 127   Potassium 3.5 - 5.1 mmol/L 4.0   Chloride 98 - 111 mmol/L 93   CO2 22 - 32 mmol/L 24   Calcium 8.9 - 10.3 mg/dL 9.0   Total Protein 6.5 - 8.1 g/dL 8.1    Total Bilirubin 0.3 - 1.2 mg/dL 1.3   Alkaline Phos 38 - 126 U/L 166   AST 15 - 41 U/L 22   ALT 0 - 44 U/L 37       Latest Ref Rng & Units 03/05/2023   11:14 AM  CBC  WBC 4.0 - 10.5 K/uL 8.3   Hemoglobin 12.0 - 15.0 g/dL 46.9   Hematocrit 62.9 - 46.0 % 30.9   Platelets 150 - 400 K/uL 255     No images are attached to the encounter.  IR IMAGING GUIDED PORT INSERTION  Result Date: 03/04/2023 CLINICAL DATA:  Pancreatic carcinoma and need for porta cath for chemotherapy. EXAM: IMPLANTED PORT A CATH PLACEMENT WITH ULTRASOUND AND FLUOROSCOPIC GUIDANCE ANESTHESIA/SEDATION: Moderate (conscious) sedation was employed during this procedure. A total of Versed 2.0 mg and Fentanyl 100 mcg was administered intravenously by radiology nursing. Moderate Sedation Time: 37 minutes. The patient's level of consciousness and vital signs were monitored continuously by radiology nursing throughout the procedure under my direct supervision. MEDICATIONS: 1 g IV vancomycin FLUOROSCOPY: 30 seconds.  2.0 mGy. PROCEDURE: The procedure, risks, benefits, and alternatives were explained to the patient. Questions regarding the procedure were encouraged and answered. The patient understands and consents to the procedure. A time-out was performed prior to initiating the procedure. Ultrasound was utilized to confirm patency of the right internal jugular vein. An ultrasound image was saved and recorded. The right neck and chest were prepped with chlorhexidine in a sterile fashion, and a sterile drape was applied covering the operative field. Maximum barrier sterile technique with sterile gowns and gloves were used for the procedure. Local anesthesia was provided with 1% lidocaine. After creating a small venotomy incision, a 21 gauge needle was advanced into the right internal jugular vein under direct, real-time ultrasound guidance. Ultrasound image documentation was performed. After securing guidewire access, an 8 Fr dilator was  placed. A J-wire was kinked to measure appropriate catheter length. A subcutaneous port pocket was then created along the upper chest wall utilizing sharp and blunt dissection. Portable cautery was utilized. The pocket was irrigated with sterile saline. A single lumen power injectable port was chosen for placement. The 8 Fr catheter was tunneled from the port pocket site to the venotomy incision. The port was placed in the pocket. External catheter was trimmed to appropriate length based on guidewire measurement. At the venotomy, an 8 Fr peel-away sheath was placed over a guidewire. The catheter was then placed through the sheath and the sheath removed. Final catheter positioning was confirmed and documented with a fluoroscopic spot image. The port was accessed with a needle and aspirated and flushed with heparinized saline. The access needle was removed. The venotomy and port pocket incisions were closed with subcutaneous 3-0 Monocryl and subcuticular 4-0 Vicryl. Dermabond was applied to both incisions. COMPLICATIONS: COMPLICATIONS None FINDINGS: After catheter placement, the tip lies  at the cavo-atrial junction. The catheter aspirates normally and is ready for immediate use. IMPRESSION: Placement of single lumen port a cath via right internal jugular vein. The catheter tip lies at the cavo-atrial junction. A power injectable port a cath was placed and is ready for immediate use. Electronically Signed   By: Irish Lack M.D.   On: 03/04/2023 10:04     Assessment and plan- Patient is a 57 y.o. female with borderline resectable pancreatic adenocarcinoma stage IIb T2b N1 M0 here for on treatment assessment prior to cycle 1 day 1 of neoadjuvant gemcitabine Abraxane chemotherapy  Blood sugar today in the clinic is 482.  Have asked her to restart her [place] Pepper which was stopped during her hospital stay.  Will also be calling endocrinology Dr. Jeralene Huff office to see if we can move her appointment up.  She does not  see her until the next couple of weeks.  Hyponatremia secondary to hyperglycemia.  Patient will need to follow-up with her primary care doctor as well to get her blood sugars under better control until she gets seen by endocrinology  Counts otherwise okay to proceed with cycle 1 day 1 of neoadjuvant gemcitabine Abraxane chemotherapy.  I am reducing the dose of Abraxane to 100 mg/m.  She will continue to get gemcitabine at 1000 mg/m.  We will see if she is able to tolerate 3 weeks on 1 week off regimen but if she does not I will switch her to 2 weeks on 1 week off regimen instead.  She will directly proceed for cycle 1 day 8 of gemcitabine Abraxane chemotherapy next week and I will see her back in 2 weeks for cycle 1 day 15   Visit Diagnosis 1. Uncontrolled type 2 diabetes mellitus with hyperglycemia (HCC)   2. Pancreatic adenocarcinoma (HCC)   3. Hyponatremia   4. Encounter for antineoplastic chemotherapy      Dr. Owens Shark, MD, MPH [facility] at [facility] [phone] 03/05/2023 12:59 PM

## 2023-03-05 NOTE — Patient Instructions (Signed)
[facility]  Discharge Instructions: Thank you for choosing [facility] to provide your oncology and hematology care.  If you have a lab appointment with the Cancer Center, please go directly to the Cancer Center and check in at the registration area.  Wear comfortable clothing and clothing appropriate for easy access to any Portacath or PICC line.   We strive to give you quality time with your provider. You may need to reschedule your appointment if you arrive late (15 or more minutes).  Arriving late affects you and other patients whose appointments are after yours.  Also, if you miss three or more appointments without notifying the office, you may be dismissed from the clinic at the provider's discretion.      For prescription refill requests, have your pharmacy contact our office and allow 72 hours for refills to be completed.    Today you received the following chemotherapy and/or immunotherapy agents Abraxane and Gemcitabine.      To help prevent nausea and vomiting after your treatment, we encourage you to take your nausea medication as directed.  BELOW ARE SYMPTOMS THAT SHOULD BE REPORTED IMMEDIATELY: *FEVER GREATER THAN 100.4 F (38 C) OR HIGHER *CHILLS OR SWEATING *NAUSEA AND VOMITING THAT IS NOT CONTROLLED WITH YOUR NAUSEA MEDICATION *UNUSUAL SHORTNESS OF BREATH *UNUSUAL BRUISING OR BLEEDING *URINARY PROBLEMS (pain or burning when urinating, or frequent urination) *BOWEL PROBLEMS (unusual diarrhea, constipation, pain near the anus) TENDERNESS IN MOUTH AND THROAT WITH OR WITHOUT PRESENCE OF ULCERS (sore throat, sores in mouth, or a toothache) UNUSUAL RASH, SWELLING OR PAIN  UNUSUAL VAGINAL DISCHARGE OR ITCHING   Items with * indicate a potential emergency and should be followed up as soon as possible or go to the Emergency Department if any problems should occur.  Please show the CHEMOTHERAPY ALERT CARD or IMMUNOTHERAPY ALERT CARD  at check-in to the Emergency Department and triage nurse.  Should you have questions after your visit or need to cancel or reschedule your appointment, please contact [place] [facility]  [phone] and follow the prompts.  Office hours are 8:00 a.m. to 4:30 p.m. Monday - Friday. Please note that voicemails left after 4:00 p.m. may not be returned until the following business day.  We are closed weekends and major holidays. You have access to a nurse at all times for urgent questions. Please call the main number to the clinic [phone] and follow the prompts.  For any non-urgent questions, you may also contact your provider using MyChart. We now offer e-Visits for anyone 82 and older to request care online for non-urgent symptoms. For details visit [email or web address].   Also download the MyChart app! Go to the app store, search "MyChart", open the app, select [place], and log in with your MyChart username and password.

## 2023-03-05 NOTE — Progress Notes (Signed)
Patient here for oncology follow-up appointment, expresses no complaints or concerns at this time.    

## 2023-03-05 NOTE — Telephone Encounter (Signed)
Per Dr. Smith Caitlyn Roth spoke to endrinology requesting patient be seen sooner for elevated sugar in clinic. Was able to get her in with Dr. Tedd Sias 21 st Mon at 830 arrive 815 patient notified in clinic

## 2023-03-06 ENCOUNTER — Telehealth: Payer: Self-pay

## 2023-03-06 NOTE — Telephone Encounter (Signed)
Telephone call to patient for follow up after receiving first infusion.   No answer but left message stating we were calling to check on them.  Encouraged patient to call for any questions or concerns.   

## 2023-03-07 ENCOUNTER — Other Ambulatory Visit: Payer: Self-pay

## 2023-03-11 ENCOUNTER — Other Ambulatory Visit: Payer: Self-pay

## 2023-03-11 ENCOUNTER — Telehealth: Payer: Self-pay | Admitting: *Deleted

## 2023-03-11 ENCOUNTER — Other Ambulatory Visit: Payer: Self-pay | Admitting: Oncology

## 2023-03-11 ENCOUNTER — Inpatient Hospital Stay: Payer: BC Managed Care – PPO

## 2023-03-11 NOTE — Telephone Encounter (Signed)
Called the pt. And got voicemail and told her that pt had an appt  today for chemo and she missed it. It is good for her to stay on the treatment on the days it is set up for. Would she call me if she is having issues that we may be able to help. Left my name and number. Also I called her sister and left message to check up on pt. She missed her treatment today.. wanted to see if she having issues with treatment or any other things

## 2023-03-14 ENCOUNTER — Encounter: Payer: Self-pay | Admitting: Oncology

## 2023-03-17 ENCOUNTER — Other Ambulatory Visit: Payer: Self-pay

## 2023-03-17 ENCOUNTER — Emergency Department: Payer: BC Managed Care – PPO

## 2023-03-17 ENCOUNTER — Other Ambulatory Visit: Payer: Self-pay | Admitting: Oncology

## 2023-03-17 ENCOUNTER — Inpatient Hospital Stay
Admission: EM | Admit: 2023-03-17 | Discharge: 2023-03-19 | DRG: 641 | Disposition: A | Discharge status: Hospice | Payer: BC Managed Care – PPO | Attending: Osteopathic Medicine | Admitting: Osteopathic Medicine

## 2023-03-17 DIAGNOSIS — C259 Malignant neoplasm of pancreas, unspecified: Secondary | ICD-10-CM

## 2023-03-17 DIAGNOSIS — Z794 Long term (current) use of insulin: Secondary | ICD-10-CM | POA: Diagnosis not present

## 2023-03-17 DIAGNOSIS — E782 Mixed hyperlipidemia: Secondary | ICD-10-CM | POA: Diagnosis present

## 2023-03-17 DIAGNOSIS — C7801 Secondary malignant neoplasm of right lung: Secondary | ICD-10-CM | POA: Diagnosis present

## 2023-03-17 DIAGNOSIS — I252 Old myocardial infarction: Secondary | ICD-10-CM | POA: Diagnosis not present

## 2023-03-17 DIAGNOSIS — Z9221 Personal history of antineoplastic chemotherapy: Secondary | ICD-10-CM

## 2023-03-17 DIAGNOSIS — C7802 Secondary malignant neoplasm of left lung: Secondary | ICD-10-CM | POA: Diagnosis present

## 2023-03-17 DIAGNOSIS — Z955 Presence of coronary angioplasty implant and graft: Secondary | ICD-10-CM

## 2023-03-17 DIAGNOSIS — D72829 Elevated white blood cell count, unspecified: Secondary | ICD-10-CM | POA: Insufficient documentation

## 2023-03-17 DIAGNOSIS — J45909 Unspecified asthma, uncomplicated: Secondary | ICD-10-CM | POA: Diagnosis present

## 2023-03-17 DIAGNOSIS — E0500 Thyrotoxicosis with diffuse goiter without thyrotoxic crisis or storm: Secondary | ICD-10-CM | POA: Diagnosis present

## 2023-03-17 DIAGNOSIS — R296 Repeated falls: Secondary | ICD-10-CM | POA: Diagnosis present

## 2023-03-17 DIAGNOSIS — N3090 Cystitis, unspecified without hematuria: Secondary | ICD-10-CM | POA: Diagnosis present

## 2023-03-17 DIAGNOSIS — F17210 Nicotine dependence, cigarettes, uncomplicated: Secondary | ICD-10-CM | POA: Diagnosis present

## 2023-03-17 DIAGNOSIS — E1165 Type 2 diabetes mellitus with hyperglycemia: Secondary | ICD-10-CM | POA: Diagnosis present

## 2023-03-17 DIAGNOSIS — E860 Dehydration: Secondary | ICD-10-CM | POA: Diagnosis present

## 2023-03-17 DIAGNOSIS — E876 Hypokalemia: Secondary | ICD-10-CM | POA: Diagnosis present

## 2023-03-17 DIAGNOSIS — F419 Anxiety disorder, unspecified: Secondary | ICD-10-CM | POA: Diagnosis present

## 2023-03-17 DIAGNOSIS — Z66 Do not resuscitate: Secondary | ICD-10-CM | POA: Diagnosis present

## 2023-03-17 DIAGNOSIS — R531 Weakness: Secondary | ICD-10-CM | POA: Diagnosis present

## 2023-03-17 DIAGNOSIS — Z7902 Long term (current) use of antithrombotics/antiplatelets: Secondary | ICD-10-CM

## 2023-03-17 DIAGNOSIS — Z9861 Coronary angioplasty status: Secondary | ICD-10-CM | POA: Diagnosis not present

## 2023-03-17 DIAGNOSIS — E1142 Type 2 diabetes mellitus with diabetic polyneuropathy: Secondary | ICD-10-CM | POA: Diagnosis present

## 2023-03-17 DIAGNOSIS — C787 Secondary malignant neoplasm of liver and intrahepatic bile duct: Secondary | ICD-10-CM | POA: Diagnosis present

## 2023-03-17 DIAGNOSIS — K760 Fatty (change of) liver, not elsewhere classified: Secondary | ICD-10-CM | POA: Diagnosis present

## 2023-03-17 DIAGNOSIS — I2510 Atherosclerotic heart disease of native coronary artery without angina pectoris: Secondary | ICD-10-CM | POA: Diagnosis present

## 2023-03-17 DIAGNOSIS — R54 Age-related physical debility: Secondary | ICD-10-CM | POA: Diagnosis present

## 2023-03-17 DIAGNOSIS — E871 Hypo-osmolality and hyponatremia: Secondary | ICD-10-CM | POA: Diagnosis present

## 2023-03-17 DIAGNOSIS — Z515 Encounter for palliative care: Secondary | ICD-10-CM

## 2023-03-17 DIAGNOSIS — I10 Essential (primary) hypertension: Secondary | ICD-10-CM | POA: Diagnosis present

## 2023-03-17 DIAGNOSIS — E1169 Type 2 diabetes mellitus with other specified complication: Secondary | ICD-10-CM | POA: Diagnosis not present

## 2023-03-17 DIAGNOSIS — Z91011 Allergy to milk products: Secondary | ICD-10-CM

## 2023-03-17 DIAGNOSIS — Z7982 Long term (current) use of aspirin: Secondary | ICD-10-CM

## 2023-03-17 DIAGNOSIS — Z885 Allergy status to narcotic agent status: Secondary | ICD-10-CM

## 2023-03-17 DIAGNOSIS — Z803 Family history of malignant neoplasm of breast: Secondary | ICD-10-CM

## 2023-03-17 DIAGNOSIS — N83201 Unspecified ovarian cyst, right side: Secondary | ICD-10-CM | POA: Diagnosis present

## 2023-03-17 DIAGNOSIS — Z881 Allergy status to other antibiotic agents status: Secondary | ICD-10-CM

## 2023-03-17 DIAGNOSIS — K219 Gastro-esophageal reflux disease without esophagitis: Secondary | ICD-10-CM | POA: Diagnosis present

## 2023-03-17 DIAGNOSIS — Z8673 Personal history of transient ischemic attack (TIA), and cerebral infarction without residual deficits: Secondary | ICD-10-CM

## 2023-03-17 DIAGNOSIS — Z79899 Other long term (current) drug therapy: Secondary | ICD-10-CM

## 2023-03-17 DIAGNOSIS — N838 Other noninflammatory disorders of ovary, fallopian tube and broad ligament: Secondary | ICD-10-CM

## 2023-03-17 DIAGNOSIS — Z7984 Long term (current) use of oral hypoglycemic drugs: Secondary | ICD-10-CM

## 2023-03-17 DIAGNOSIS — Z888 Allergy status to other drugs, medicaments and biological substances status: Secondary | ICD-10-CM

## 2023-03-17 DIAGNOSIS — C251 Malignant neoplasm of body of pancreas: Secondary | ICD-10-CM | POA: Diagnosis present

## 2023-03-17 LAB — GLUCOSE, CAPILLARY
Glucose-Capillary: 203 mg/dL — ABNORMAL HIGH (ref 70–99)
Glucose-Capillary: 194 mg/dL — ABNORMAL HIGH (ref 70–99)

## 2023-03-17 LAB — URINALYSIS, W/ REFLEX TO CULTURE (INFECTION SUSPECTED)
Bilirubin Urine: NEGATIVE
Glucose, UA: NEGATIVE mg/dL
Hgb urine dipstick: NEGATIVE
Ketones, ur: NEGATIVE mg/dL
Leukocytes,Ua: NEGATIVE
Nitrite: NEGATIVE
Protein, ur: 30 mg/dL — AB
Specific Gravity, Urine: 1.013 (ref 1.005–1.030)
pH: 5 (ref 5.0–8.0)

## 2023-03-17 LAB — CBC WITH DIFFERENTIAL/PLATELET
Abs Immature Granulocytes: 0.27 10*3/uL — ABNORMAL HIGH (ref 0.00–0.07)
Basophils Absolute: 0.1 10*3/uL (ref 0.0–0.1)
Basophils Relative: 1 %
Eosinophils Absolute: 0.1 10*3/uL (ref 0.0–0.5)
Eosinophils Relative: 0 %
HCT: 27.7 % — ABNORMAL LOW (ref 36.0–46.0)
Hemoglobin: 9.1 g/dL — ABNORMAL LOW (ref 12.0–15.0)
Immature Granulocytes: 2 %
Lymphocytes Relative: 5 %
Lymphs Abs: 0.7 10*3/uL (ref 0.7–4.0)
MCH: 28.6 pg (ref 26.0–34.0)
MCHC: 32.9 g/dL (ref 30.0–36.0)
MCV: 87.1 fL (ref 80.0–100.0)
Monocytes Absolute: 1.3 10*3/uL — ABNORMAL HIGH (ref 0.1–1.0)
Monocytes Relative: 9 %
Neutro Abs: 12.1 10*3/uL — ABNORMAL HIGH (ref 1.7–7.7)
Neutrophils Relative %: 83 %
Platelets: 329 10*3/uL (ref 150–400)
RBC: 3.18 MIL/uL — ABNORMAL LOW (ref 3.87–5.11)
RDW: 14.3 % (ref 11.5–15.5)
WBC: 14.4 10*3/uL — ABNORMAL HIGH (ref 4.0–10.5)
nRBC: 0 % (ref 0.0–0.2)

## 2023-03-17 LAB — BASIC METABOLIC PANEL
Anion gap: 9 (ref 5–15)
BUN: 21 mg/dL — ABNORMAL HIGH (ref 6–20)
CO2: 25 mmol/L (ref 22–32)
Calcium: 8.3 mg/dL — ABNORMAL LOW (ref 8.9–10.3)
Chloride: 93 mmol/L — ABNORMAL LOW (ref 98–111)
Creatinine, Ser: 0.86 mg/dL (ref 0.44–1.00)
GFR, Estimated: >60 mL/min (ref >60)
Glucose, Bld: 202 mg/dL — ABNORMAL HIGH (ref 70–99)
Potassium: 2.6 mmol/L — CL (ref 3.5–5.1)
Sodium: 127 mmol/L — ABNORMAL LOW (ref 135–145)

## 2023-03-17 LAB — COMPREHENSIVE METABOLIC PANEL
ALT: 38 U/L (ref 0–44)
AST: 32 U/L (ref 15–41)
Albumin: 2.1 g/dL — ABNORMAL LOW (ref 3.5–5.0)
Alkaline Phosphatase: 162 U/L — ABNORMAL HIGH (ref 38–126)
Anion gap: 11 (ref 5–15)
BUN: 26 mg/dL — ABNORMAL HIGH (ref 6–20)
CO2: 24 mmol/L (ref 22–32)
Calcium: 8.4 mg/dL — ABNORMAL LOW (ref 8.9–10.3)
Chloride: 91 mmol/L — ABNORMAL LOW (ref 98–111)
Creatinine, Ser: 0.85 mg/dL (ref 0.44–1.00)
GFR, Estimated: >60 mL/min (ref >60)
Glucose, Bld: 273 mg/dL — ABNORMAL HIGH (ref 70–99)
Potassium: 2.5 mmol/L — CL (ref 3.5–5.1)
Sodium: 126 mmol/L — ABNORMAL LOW (ref 135–145)
Total Bilirubin: 1.6 mg/dL — ABNORMAL HIGH (ref 0.3–1.2)
Total Protein: 6.6 g/dL (ref 6.5–8.1)

## 2023-03-17 LAB — CREATININE, URINE, RANDOM: Creatinine, Urine: 121 mg/dL

## 2023-03-17 LAB — MAGNESIUM
Magnesium: 1.6 mg/dL — ABNORMAL LOW (ref 1.7–2.4)
Magnesium: 2.2 mg/dL (ref 1.7–2.4)

## 2023-03-17 LAB — PHOSPHORUS: Phosphorus: 2.7 mg/dL (ref 2.5–4.6)

## 2023-03-17 LAB — LIPASE, BLOOD: Lipase: 24 U/L (ref 11–51)

## 2023-03-17 LAB — SODIUM, URINE, RANDOM: Sodium, Ur: <10 mmol/L

## 2023-03-17 LAB — PROTIME-INR
INR: 1.3 — ABNORMAL HIGH (ref 0.8–1.2)
Prothrombin Time: 16.3 s — ABNORMAL HIGH (ref 11.4–15.2)

## 2023-03-17 LAB — OSMOLALITY: Osmolality: 280 mosm/kg (ref 275–295)

## 2023-03-17 LAB — OSMOLALITY, URINE: Osmolality, Ur: 358 mosm/kg (ref 300–900)

## 2023-03-17 MED ORDER — HYDROMORPHONE HCL 1 MG/ML IJ SOLN
0.5000 mg | INTRAMUSCULAR | Status: DC | PRN
  Administered 2023-03-17 – 2023-03-19 (×4): 0.5 mg via INTRAVENOUS
  Filled 2023-03-17 (×5): qty 0.5

## 2023-03-17 MED ORDER — POTASSIUM CHLORIDE IN NACL 20-0.9 MEQ/L-% IV SOLN
INTRAVENOUS | Status: AC
Start: 2023-03-17 — End: 2023-03-18
  Filled 2023-03-17 (×3): qty 1000

## 2023-03-17 MED ORDER — POTASSIUM CHLORIDE 10 MEQ/100ML IV SOLN
10.0000 meq | INTRAVENOUS | Status: AC
  Administered 2023-03-17 (×2): 10 meq via INTRAVENOUS
  Filled 2023-03-17 (×2): qty 100

## 2023-03-17 MED ORDER — MAGNESIUM SULFATE 2 GM/50ML IV SOLN
2.0000 g | INTRAVENOUS | Status: AC
  Administered 2023-03-17: 2 g via INTRAVENOUS
  Filled 2023-03-17: qty 50

## 2023-03-17 MED ORDER — METHIMAZOLE 2.5 MG HALF TABLET
2.5000 mg | ORAL_TABLET | Freq: Every day | ORAL | Status: DC
  Administered 2023-03-17 – 2023-03-19 (×2): 2.5 mg via ORAL
  Filled 2023-03-17 (×2): qty 1

## 2023-03-17 MED ORDER — ONDANSETRON HCL 4 MG PO TABS
4.0000 mg | ORAL_TABLET | Freq: Four times a day (QID) | ORAL | Status: DC | PRN
  Administered 2023-03-19: 4 mg via ORAL
  Filled 2023-03-17: qty 1

## 2023-03-17 MED ORDER — CLOPIDOGREL BISULFATE 75 MG PO TABS
75.0000 mg | ORAL_TABLET | Freq: Every day | ORAL | Status: DC
  Administered 2023-03-17 – 2023-03-19 (×3): 75 mg via ORAL
  Filled 2023-03-17 (×3): qty 1

## 2023-03-17 MED ORDER — ALBUTEROL SULFATE (2.5 MG/3ML) 0.083% IN NEBU: 3.0000 mL | INHALATION_SOLUTION | Freq: Four times a day (QID) | RESPIRATORY_TRACT | Status: DC | PRN

## 2023-03-17 MED ORDER — SODIUM CHLORIDE 0.9 % IV BOLUS
1000.0000 mL | Freq: Once | INTRAVENOUS | Status: AC
  Administered 2023-03-17: 1000 mL via INTRAVENOUS

## 2023-03-17 MED ORDER — MEROPENEM 1 G IV SOLR
1.0000 g | Freq: Once | INTRAVENOUS | Status: AC
  Administered 2023-03-17: 1 g via INTRAVENOUS
  Filled 2023-03-17: qty 20

## 2023-03-17 MED ORDER — ONDANSETRON HCL 4 MG/2ML IJ SOLN
4.0000 mg | Freq: Four times a day (QID) | INTRAMUSCULAR | Status: DC | PRN
  Administered 2023-03-19: 4 mg via INTRAVENOUS
  Filled 2023-03-17 (×2): qty 2

## 2023-03-17 MED ORDER — ENOXAPARIN SODIUM 40 MG/0.4ML IJ SOSY
40.0000 mg | PREFILLED_SYRINGE | INTRAMUSCULAR | Status: DC
  Administered 2023-03-17: 40 mg via SUBCUTANEOUS
  Filled 2023-03-17 (×2): qty 0.4

## 2023-03-17 MED ORDER — ENSURE ENLIVE PO LIQD
237.0000 mL | Freq: Two times a day (BID) | ORAL | Status: DC
  Administered 2023-03-18 (×2): 237 mL via ORAL

## 2023-03-17 MED ORDER — PANTOPRAZOLE SODIUM 40 MG PO TBEC
40.0000 mg | DELAYED_RELEASE_TABLET | Freq: Every day | ORAL | Status: DC
  Administered 2023-03-18 – 2023-03-19 (×2): 40 mg via ORAL
  Filled 2023-03-17 (×2): qty 1

## 2023-03-17 MED ORDER — ROSUVASTATIN CALCIUM 20 MG PO TABS
20.0000 mg | ORAL_TABLET | Freq: Every day | ORAL | Status: DC
  Administered 2023-03-18 – 2023-03-19 (×2): 20 mg via ORAL
  Filled 2023-03-17 (×2): qty 1

## 2023-03-17 MED ORDER — INSULIN ASPART 100 UNIT/ML IJ SOLN
0.0000 [IU] | Freq: Three times a day (TID) | INTRAMUSCULAR | Status: DC
  Administered 2023-03-18: 3 [IU] via SUBCUTANEOUS
  Filled 2023-03-17 (×4): qty 1

## 2023-03-17 MED ORDER — POTASSIUM CHLORIDE 10 MEQ/100ML IV SOLN
10.0000 meq | INTRAVENOUS | Status: AC
  Administered 2023-03-17 – 2023-03-18 (×4): 10 meq via INTRAVENOUS
  Filled 2023-03-17 (×4): qty 100

## 2023-03-17 NOTE — Assessment & Plan Note (Signed)
Cont statin

## 2023-03-17 NOTE — Assessment & Plan Note (Signed)
s/p PCI to RCA 12/27/22 Cont aspirin and plavix

## 2023-03-17 NOTE — Assessment & Plan Note (Addendum)
Worsening weakness in setting of baseline pancreatic cancer with recently being started on chemotherapy Concurrent electrolyte abnormalities with potassium of 2.5, corrected Na 129, Mg 1.6  Replete  IVF hydration  Noted enlarging pancreatic mass on imaging as well as new hepatic lesions Oncology notified Follow-up recommendations

## 2023-03-17 NOTE — Assessment & Plan Note (Signed)
-  Continue methimazole ?

## 2023-03-17 NOTE — Consult Note (Signed)
PHARMACY -  BRIEF ANTIBIOTIC NOTE   Pharmacy has received consult(s) for meropenem dosing from an ED provider.  The patient's profile has been reviewed for ht/wt/allergies/indication/available labs.    One time order(s) placed for Meropenem 1 gram IV x 1  Further antibiotics/pharmacy consults should be ordered by admitting physician if indicated.                       Thank you, Barrie Folk 03/17/2023  1:33 PM

## 2023-03-17 NOTE — ED Notes (Addendum)
EDP notified of critical potassium result of 2.5

## 2023-03-17 NOTE — Assessment & Plan Note (Signed)
PPI ?

## 2023-03-17 NOTE — Assessment & Plan Note (Signed)
Baseline stage II pancreatic adenocarcinoma followed by Dr. Smith Robert outpatient Recently started on chemotherapy Noted enlarged pancreatic mass as well as new hepatic lesions on CT scan today Oncology notified Follow-up recommendations

## 2023-03-17 NOTE — Assessment & Plan Note (Addendum)
Sodium 126 with glucose of 273 Corrected sodium of 129 Clinically dry IV fluid hydration Goal 6-8 mill equivalent change over next 12 to 24 hours Urine and serum studies Monitor

## 2023-03-17 NOTE — H&P (Addendum)
History and Physical    Patient: Caitlyn Roth:[id number] DOB: 1966-04-05 DOA: 03/17/2023 DOS: the patient was seen and examined on 03/17/2023 PCP: Patrice Paradise, MD  Patient coming from: Home  Chief Complaint:  Chief Complaint  Patient presents with   Weakness   HPI: Caitlyn Roth is a 57 y.o. female with medical history significant of Type II DM, HTN/HLD, CVA (2009), CAD s/p STEMI x 2 s/p DES to RCA (12/27/2022; on DAPT) and recent diagnosis of pancreatic adenocarcinoma s/p ERCP/ CBD stent placement 01/28/23 with weakness, hyponatremia, dehydration.  History from patient as well as friend at the bedside.  Per report, patient with worsening weakness over the past 1 to 2 weeks.  Noted admission in the [facility] system September 20 through October 6.  Had biopsy confirmed pancreatic adenocarcinoma.  Has had outpatient follow-up with Dr. Smith Robert with oncology.  Recently started on chemotherapy treatment.  Per report, patient with worsening weakness, decreased p.o. intake.  No chest pain or shortness of breath.  Minimal to mild lower abdominal pain.  No urea.  No focal hemiparesis or confusion.  Weakness has progressively worsened per the friend.  Per patient and friend, patient has had 1 treatment of chemotherapy thus far.  No dysuria or increased urinary frequency. Presented to the ER afebrile, hemodynamically stable.  Satting well on room air.  White count 14.4, hemoglobin 9.1, platelets 329.  Sodium 126, potassium 2.5, glucose 273, creatinine 0.85, alk phos 162, T. bili 1.6.  Lipase 24.  Magnesium 1.6.  Urinalysis not indicative of infection.  CT head grossly stable.  CT abdomen pelvis with noted increased size of pancreatic head mass, new low-attenuation liver lesions suspicious for hepatic metastasis, multiple small pulmonary nodules in both lung bases consistent with pulmonary metastasis, 6.7 cm cystic lesion in right adnexa. Review of Systems: As mentioned in the history of present illness. All  other systems reviewed and are negative. Past Medical History:  Diagnosis Date   A-fib [facility])    a.) CHA2DS2VASc = 6 (sex, HTN, CVA/TIA x2,  vascular disease history, T2DM);  b.) rate/rhythm maintained on oral metoprolol succinate; no chronic anticoagulation   Acute ST elevation myocardial infarction (STEMI) of inferior wall (HCC) 01/28/2015   a.) LHC/PCI 01/28/2015 --> 70% pRCA (3.5 x 20 mm Promus Premier)   ADHD (attention deficit hyperactivity disorder)    a.) on amphetamine-dextroamphetamine   Anxiety    Arthritis    Asthma    CAD (coronary artery disease) 01/28/2015   a.) LHC/PCI 01/28/2015 --> 70% pRCA (3.5 x 20 mm Promus Premier DES)   Cancer (HCC)    Diabetic polyneuropathy (HCC)    Diastolic dysfunction 01/28/2015   a.) TTE 01/28/2015: EF 50, inf/post HK, mild BAE, triv MR/TR, G1DD   Eczema    Family history of adverse reaction to anesthesia    a.) delayed emergence in 1st degree relative (father)   Caitlyn Roth' corneal dystrophy    GERD (gastroesophageal reflux disease)    Graves disease    a.) on methimazole   Hepatic steatosis    Hyperlipemia    Hypertension    Migraines    Pancreatic cancer (HCC)    Right rotator cuff tendinitis    Sleep apnea    Stroke (HCC) 2009   peripheral vision in left eye has been affected,   T2DM (type 2 diabetes mellitus) (HCC)    Vitamin B 12 deficiency    Past Surgical History:  Procedure Laterality Date   CARPOMETACARPAL (CMC) FUSION OF THUMB  Left 08/07/2022   Procedure: LEFT THUMB CMC ARTHROPLASTY;  Surgeon: Christena Flake, MD;  Location: [facility] ORS;  Service: Orthopedics;  Laterality: Left;   CESAREAN SECTION     CHOLECYSTECTOMY     CORONARY ANGIOPLASTY WITH STENT PLACEMENT Left 01/28/2015   Procedure: CORONARY ANGIOPLASTY WITH STENT PLACEMENT; Location: [facility]   CORONARY/GRAFT ACUTE MI REVASCULARIZATION N/A 12/27/2022   Procedure: Coronary/Graft Acute MI Revascularization;  Surgeon: Alwyn Pea, MD;  Location: [facility] INVASIVE CV [facility];   Service: Cardiovascular;  Laterality: N/A;   IR IMAGING GUIDED PORT INSERTION  03/04/2023   KNEE ARTHROSCOPY Left    KNEE SURGERY Left 1990   McKay's procedure   LEFT HEART CATH AND CORONARY ANGIOGRAPHY N/A 12/27/2022   Procedure: LEFT HEART CATH AND CORONARY ANGIOGRAPHY;  Surgeon: Alwyn Pea, MD;  Location: [facility] INV[facility]IVE CV [facility];  Service: Cardiovascular;  Laterality: N/A;   SHOULDER ARTHROSCOPY WITH OPEN ROTATOR CUFF REPAIR Right 10/01/2016   Procedure: SHOULDER ARTHROSCOPY WITH OPEN ROTATOR CUFF REPAIR;  Surgeon: Christena Flake, MD;  Location: [facility] ORS;  Service: Orthopedics;  Laterality: Right;  Shoulder Block    SINUS SURGERY WITH INSTATRAK     TONSILLECTOMY  1978   TUBAL LIGATION     Social History:  reports that she has been smoking cigarettes. She has a 17.5 pack-year smoking history. She has never used smokeless tobacco. She reports current alcohol use. She reports that she does not use drugs.  Allergies  Allergen Reactions   Cefdinir Nausea And Vomiting and Other (See Comments)    Swelling of throat.   Milk (Cow) Other (See Comments) and Anaphylaxis    Other reaction(s): OTHER  Difficulty breathing, throat swells   Prednisone     Other reaction(s): Other (See Comments) LOST 40% OF VISION IN ONE EYE  Patient reports she is allergic to "all steroids"   Acyclovir Nausea And Vomiting    Other reaction(s): Unknown   Codeine Nausea Only   Other Nausea Only    Antibiotic (oral) patient unsure which one    Family History  Problem Relation Age of Onset   Breast cancer Maternal Aunt 60    Prior to Admission medications   Medication Sig Start Date End Date Taking? Authorizing Provider  Insulin Aspart FlexPen (NOVOLOG) 100 UNIT/ML Inject 50 Units into the skin daily. Daily as directed 03/10/23  Yes [provider]  albuterol (VENTOLIN HFA) 108 (90 Base) MCG/ACT inhaler Inhale 2 puffs into the lungs every 6 (six) hours as needed for wheezing or shortness of  breath.    [provider]  amLODipine (NORVASC) 10 MG tablet Take 1 tablet by mouth daily. 02/24/23 02/24/24  [provider]  amphetamine-dextroamphetamine (ADDERALL XR) 30 MG 24 hr capsule Take 30 mg by mouth daily. 02/19/14   [provider]  brimonidine (ALPHAGAN) 0.2 % ophthalmic solution Place 1 drop into the right eye 3 (three) times daily.    [provider]  busPIRone (BUSPAR) 5 MG tablet Take 5 mg by mouth 3 (three) times daily.    [provider]  clopidogrel (PLAVIX) 75 MG tablet Take by mouth. 01/17/23   [provider]  Cyanocobalamin (B-12) 1000 MCG/ML KIT Inject 1 mL as directed once a week.    [provider]  desloratadine (CLARINEX) 5 MG tablet Take 5 mg by mouth daily.     [provider]  empagliflozin (JARDIANCE) 25 MG TABS tablet Take 25 mg by mouth daily. Patient not taking: Reported on 02/25/2023  [provider]  gabapentin (NEURONTIN) 300 MG capsule Take 600 mg by mouth at bedtime as needed (neuropathy).    [provider]  glipiZIDE (GLUCOTROL) 5 MG tablet Take 5 mg by mouth daily before breakfast. Patient not taking: Reported on 02/25/2023 01/02/23 01/02/24  [provider]  Insulin Glargine (BASAGLAR KWIKPEN) 100 UNIT/ML Inject up to 40 units daily. Take as directed. 01/29/23   [provider]  lidocaine-prilocaine (EMLA) cream Apply to affected area once 02/26/23   Creig Hines, MD  lisinopril (ZESTRIL) 5 MG tablet Take 1 tablet (5 mg total) by mouth daily. Patient not taking: Reported on 02/25/2023 12/29/22   Ernestene Mention, MD  losartan (COZAAR) 50 MG tablet Take 1 tablet by mouth daily. 01/03/23 01/03/24  [provider]  loteprednol (LOTEMAX) 0.5 % ophthalmic suspension Place 1 drop into the right eye daily.    [provider]  metFORMIN (GLUCOPHAGE-XR) 500 MG 24 hr tablet Take 1 tablet (500 mg total) by mouth daily with breakfast. Patient not  taking: Reported on 01/09/2023 12/28/22   Ernestene Mention, MD  methimazole (TAPAZOLE) 5 MG tablet Take 2.5 mg by mouth at bedtime.    [provider]  metoprolol succinate (TOPROL-XL) 50 MG 24 hr tablet Take 1 tablet (50 mg total) by mouth daily. Take with or immediately following a meal. Patient taking differently: Take 100 mg by mouth daily. Take with or immediately following a meal. 12/29/22   Kadali, Renuka A, MD  montelukast (SINGULAIR) 10 MG tablet Take 10 mg by mouth at bedtime.    [provider]  ondansetron (ZOFRAN) 8 MG tablet Take 1 tablet (8 mg total) by mouth every 8 (eight) hours as needed for nausea or vomiting. 02/26/23   Creig Hines, MD  pantoprazole (PROTONIX) 40 MG tablet Take 40 mg by mouth at bedtime.    [provider]  prochlorperazine (COMPAZINE) 10 MG tablet Take by mouth. 01/29/23 02/28/23  [provider]  prochlorperazine (COMPAZINE) 10 MG tablet Take 1 tablet (10 mg total) by mouth every 6 (six) hours as needed for nausea or vomiting. 02/26/23   Creig Hines, MD  rosuvastatin (CRESTOR) 20 MG tablet Take 20 mg by mouth daily. 01/03/23 01/03/24  [provider]  ticagrelor (BRILINTA) 90 MG TABS tablet Take 1 tablet (90 mg total) by mouth 2 (two) times daily. 12/27/22   Ernestene Mention, MD  timolol (BETIMOL) 0.5 % ophthalmic solution Place 1 drop into the right eye 2 (two) times daily.    [provider]  venlafaxine XR (EFFEXOR-XR) 75 MG 24 hr capsule Take 225 mg by mouth daily with breakfast.    [provider]    Physical Exam: Vitals:   03/17/23 1430 03/17/23 1442 03/17/23 1500 03/17/23 1539  BP: 101/70  (!) 102/58 107/65  Pulse: 91  90 93  Resp: 15  15 15   Temp:  98.5 F (36.9 C)    TempSrc:  Oral    SpO2: 99%  98% 100%   Physical Exam Constitutional:      Appearance: She is normal weight.  HENT:     Head: Normocephalic and atraumatic.     Nose: Nose normal.     Mouth/Throat:     Mouth: Mucous  membranes are moist.  Eyes:     Pupils: Pupils are equal, round, and reactive to light.  Cardiovascular:     Rate and Rhythm: Normal rate and regular rhythm.  Pulmonary:  Effort: Pulmonary effort is normal.  Abdominal:     General: Bowel sounds are normal.  Musculoskeletal:        General: Normal range of motion.  Skin:    General: Skin is warm.  Neurological:     General: No focal deficit present.  Psychiatric:        Mood and Affect: Mood normal.     Data Reviewed:  There are no new results to review at this time.  CT ABDOMEN PELVIS WO CONTRAST CLINICAL DATA:  Abdominal pain. Recent fall. Fever. Undergoing chemotherapy for pancreatic carcinoma. * Tracking Code: BO *  EXAM: CT ABDOMEN AND PELVIS WITHOUT CONTRAST  TECHNIQUE: Multidetector CT imaging of the abdomen and pelvis was performed following the standard protocol without IV contrast.  RADIATION DOSE REDUCTION: This exam was performed according to the departmental dose-optimization program which includes automated exposure control, adjustment of the mA and/or kV according to patient size and/or use of iterative reconstruction technique.  COMPARISON:  Abdomen only MRI on 01/20/2023  FINDINGS: Lower chest: Multiple small pulmonary nodules are seen in both lung bases, consistent with pulmonary metastases.  Hepatobiliary: Evaluation is limited by lack of IV contrast, however, there are at least 2 ill-defined low-attenuation lesions seen in the inferior left and right hepatic lobes measuring approximately 3 cm which are highly suspicious for hepatic metastases.  Prior cholecystectomy again noted. A stent is now seen in the common bile duct with pneumobilia.  Pancreas: Mass in the pancreatic head appears increased in size since previous study, currently measuring approximately 5.1 x 3.7 cm compared to 3.2 x 2.8 cm previously. This is consistent with enlarging pancreatic carcinoma.  Spleen:  Within normal  limits in size.  Adrenals/Urinary tract: No evidence of urolithiasis or hydronephrosis. Unremarkable unopacified urinary bladder.  Stomach/Bowel: No evidence of obstruction, inflammatory process, or abnormal fluid collections.  Vascular/Lymphatic: Mildly enlarged lymph node in the porta hepatis measuring 12 mm appears new or increased since previous study, suspicious for metastatic lymphadenopathy. No evidence of abdominal aortic aneurysm.  Reproductive: Uterus is unremarkable. A cystic lesion is seen in the right adnexa measuring 6.7 x 4.9 cm which shows at least 1 internal septation, but is not adequately characterized on this unenhanced exam. No evidence of free fluid.  Other:  None.  Musculoskeletal:  No suspicious bone lesions identified.  IMPRESSION: Increased size of pancreatic head mass, consistent with pancreatic carcinoma.  New low-attenuation liver lesions, highly suspicious for hepatic metastases.  Probable mild metastatic lymphadenopathy in porta hepatis.  Multiple small pulmonary nodules in both lung bases, consistent with pulmonary metastases.  6.7 cm cystic lesion in right adnexa, not adequately characterized on this unenhanced exam. Cystic ovarian neoplasm cannot be excluded. Consider pelvic ultrasound or MRI for further characterization.  Electronically Signed   By: Danae Orleans M.D.   On: 03/17/2023 15:00 CT Head Wo Contrast CLINICAL DATA:  Provided history: Head trauma, abnormal mental status.  EXAM: CT HEAD WITHOUT CONTRAST  TECHNIQUE: Contiguous axial images were obtained from the base of the skull through the vertex without intravenous contrast.  RADIATION DOSE REDUCTION: This exam was performed according to the departmental dose-optimization program which includes automated exposure control, adjustment of the mA and/or kV according to patient size and/or use of iterative reconstruction technique.  COMPARISON:  Head CT  02/04/2008.  FINDINGS: Brain:  Mild generalized cerebral atrophy.  Patchy and ill-defined hypoattenuation within the cerebral white matter, overall moderate in severity and greater than expected for age.  There is no  acute intracranial hemorrhage.  No demarcated cortical infarct.  No extra-axial fluid collection.  No evidence of an intracranial mass.  No midline shift.  Vascular: No hyperdense vessel.  Atherosclerotic calcifications.  Skull: No calvarial fracture or aggressive osseous lesion.  Sinuses/Orbits: No mass or acute finding within the imaged orbits. Mild mucosal thickening within the right maxillary sinus at the imaged levels. Small mucous retention cyst within the left maxillary sinus at the imaged levels. 6 mm left frontoethmoidal osteoma.  IMPRESSION: 1. No evidence of an acute intracranial abnormality. 2. Cerebral white matter disease which is overall moderate in severity and greater than expected for age. Although nonspecific, this is most often secondary to chronic small vessel ischemia. 3. Mild generalized cerebral atrophy. 4. Paranasal sinus disease at the imaged levels as described.  Electronically Signed   By: Jackey Loge D.O.   On: 03/17/2023 14:45 DG Chest 1 View CLINICAL DATA:  Weakness.  Falls.  Confusion.  EXAM: CHEST  1 VIEW  COMPARISON:  12/27/2022.  FINDINGS: Low lung volume. Bilateral lung fields are clear. Elevated right hemidiaphragm again seen. Bilateral costophrenic angles are clear.  Normal cardio-mediastinal silhouette.  No acute osseous abnormalities.  The soft tissues are within normal limits.  Right-sided CT Port-A-Cath is seen with its tip overlying the cavoatrial junction region.  IMPRESSION: *No active disease.  Electronically Signed   By: Jules Schick M.D.   On: 03/17/2023 12:27  Lab Results  Component Value Date   WBC 14.4 (H) 03/17/2023   HGB 9.1 (L) 03/17/2023   HCT 27.7 (L) 03/17/2023   MCV 87.1  03/17/2023   PLT 329 03/17/2023   Last metabolic panel Lab Results  Component Value Date   GLUCOSE 273 (H) 03/17/2023   NA 126 (L) 03/17/2023   K 2.5 (LL) 03/17/2023   CL 91 (L) 03/17/2023   CO2 24 03/17/2023   BUN 26 (H) 03/17/2023   CREATININE 0.85 03/17/2023   GFRNONAA >60 03/17/2023   CALCIUM 8.4 (L) 03/17/2023   PHOS 2.7 03/17/2023   PROT 6.6 03/17/2023   ALBUMIN 2.1 (L) 03/17/2023   LABGLOB 2.3 01/29/2017   AGRATIO 2.1 01/29/2017   BILITOT 1.6 (H) 03/17/2023   ALKPHOS 162 (H) 03/17/2023   AST 32 03/17/2023   ALT 38 03/17/2023   ANIONGAP 11 03/17/2023    Assessment and Plan: * Weakness Worsening weakness in setting of baseline pancreatic cancer with recently being started on chemotherapy Concurrent electrolyte abnormalities with potassium of 2.5, corrected Na 129, Mg 1.6  Replete  IVF hydration  Noted enlarging pancreatic mass on imaging as well as new hepatic lesions Oncology notified Follow-up recommendations  Hyponatremia Sodium 126 with glucose of 273 Corrected sodium of 129 Clinically dry IV fluid hydration Goal 6-8 mill equivalent change over next 12 to 24 hours Urine and serum studies Monitor   Uncontrolled type 2 diabetes mellitus with hyperglycemia, without long-term current use of insulin (HCC) Blood sugar in upper 200s Sliding-scale insulin Monitor  Ovarian mass Noted 6.7 cm cystic lesion on right ovary Will check pelvic ultrasound correlate Some concern for?  Metastasis in the setting of active pancreatic adenocarcinoma Follow-up imaging GYN consult as clinically indicated Oncology consulted  Pancreatic adenocarcinoma (HCC) Baseline stage II pancreatic adenocarcinoma followed by Dr. Smith Robert outpatient Recently started on chemotherapy Noted enlarged pancreatic mass as well as new hepatic lesions on CT scan today Oncology notified Follow-up recommendations  Leukocytosis White count 14.4 on presentation No overt source of infection  present noted No dysuria, CT head  as well as CT abdomen pelvis grossly stable No hypoxia Noted to be clinically dry Will otherwise monitor for now Reassess if patient spikes a fever or if there is any further clinical decline   Asthma Stable from a resp standpoint  Cont home inhalers    Graves' disease Continue methimazole   GERD without esophagitis PPI   Mixed hyperlipidemia Cont statin    CAD (coronary artery disease) s/p PCI to RCA 12/27/22 Cont aspirin and plavix       Advance Care Planning:   Code Status: Limited: Do not attempt resuscitation (DNR) -DNR-LIMITED -Do Not Intubate/DNI    Consults: Oncology   Family Communication: Friend at the bedside   Severity of Illness: The appropriate patient status for this patient is INPATIENT. Inpatient status is judged to be reasonable and necessary in order to provide the required intensity of service to ensure the patient's safety. The patient's presenting symptoms, physical exam findings, and initial radiographic and laboratory data in the context of their chronic comorbidities is felt to place them at high risk for further clinical deterioration. Furthermore, it is not anticipated that the patient will be medically stable for discharge from the hospital within 2 midnights of admission.   * I certify that at the point of admission it is my clinical judgment that the patient will require inpatient hospital care spanning beyond 2 midnights from the point of admission due to high intensity of service, high risk for further deterioration and high frequency of surveillance required.*  Author: Floydene Flock, MD 03/17/2023 5:15 PM  For on call review [email or web address].

## 2023-03-17 NOTE — ED Provider Notes (Signed)
[facility] Provider Note    Event Date/Time   First MD Initiated Contact with Patient 03/17/23 1032     (approximate)   History   Chief Complaint: Weakness   HPI  Caitlyn Roth is a 57 y.o. female with a history of hypertension, diabetes, pancreatic cancer, atrial fibrillation who comes ED complaining of generalized weakness, multiple falls, worsening over the past week.  Also reports having intermittent fever and generalized abdominal pain.  Vomiting today.  No diarrhea.     Physical Exam   Triage Vital Signs: ED Triage Vitals  Encounter Vitals Group     BP 03/17/23 1038 99/66     Systolic BP Percentile --      Diastolic BP Percentile --      Pulse Rate 03/17/23 1035 (!) 106     Resp 03/17/23 1035 10     Temp 03/17/23 1035 98.6 F (37 C)     Temp Source 03/17/23 1035 Oral     SpO2 03/17/23 1035 100 %     Weight --      Height --      Head Circumference --      Peak Flow --      Pain Score 03/17/23 1035 5     Pain Loc --      Pain Education --      Exclude from Growth Chart --     Most recent vital signs: Vitals:   03/17/23 1430 03/17/23 1442  BP: 101/70   Pulse: 91   Resp: 15   Temp:  98.5 F (36.9 C)  SpO2: 99%     General: Awake, no distress.  CV:  Good peripheral perfusion.  Tachycardia heart rate 105 Resp:  Normal effort.  Clear to auscultation bilaterally Abd:  No distention.  Soft with generalized tenderness Other:  Dry oral mucosa   ED Results / Procedures / Treatments   Labs (all labs ordered are listed, but only abnormal results are displayed) Labs Reviewed  COMPREHENSIVE METABOLIC PANEL - Abnormal; Notable for the following components:      Result Value   Sodium 126 (*)    Potassium 2.5 (*)    Chloride 91 (*)    Glucose, Bld 273 (*)    BUN 26 (*)    Calcium 8.4 (*)    Albumin 2.1 (*)    Alkaline Phosphatase 162 (*)    Total Bilirubin 1.6 (*)    All other components within normal limits  CBC WITH  DIFFERENTIAL/PLATELET - Abnormal; Notable for the following components:   WBC 14.4 (*)    RBC 3.18 (*)    Hemoglobin 9.1 (*)    HCT 27.7 (*)    Neutro Abs 12.1 (*)    Monocytes Absolute 1.3 (*)    Abs Immature Granulocytes 0.27 (*)    All other components within normal limits  MAGNESIUM - Abnormal; Notable for the following components:   Magnesium 1.6 (*)    All other components within normal limits  PROTIME-INR - Abnormal; Notable for the following components:   Prothrombin Time 16.3 (*)    INR 1.3 (*)    All other components within normal limits  URINALYSIS, W/ REFLEX TO CULTURE (INFECTION SUSPECTED) - Abnormal; Notable for the following components:   Color, Urine AMBER (*)    APPearance HAZY (*)    Protein, ur 30 (*)    Bacteria, UA MANY (*)    All other components within normal limits  LIPASE, BLOOD  PHOSPHORUS     EKG Interpreted by me Sinus tachycardia rate 104.  Normal axis, slightly prolonged QTc.  No acute ischemic changes.   RADIOLOGY Chest x-ray interpreted by me, unremarkable.  Radiology report reviewed.  CT head unremarkable   PROCEDURES:  Procedures   MEDICATIONS ORDERED IN ED: Medications  potassium chloride 10 mEq in 100 mL IVPB (10 mEq Intravenous New Bag/Given 03/17/23 1223)  sodium chloride 0.9 % bolus 1,000 mL (0 mLs Intravenous Stopped 03/17/23 1339)  magnesium sulfate IVPB 2 g 50 mL (0 g Intravenous Stopped 03/17/23 1323)  meropenem (MERREM) 1 g in sodium chloride 0.9 % 100 mL IVPB (0 g Intravenous Stopped 03/17/23 1414)     IMPRESSION / MDM / ASSESSMENT AND PLAN / ED COURSE  I reviewed the triage vital signs and the nursing notes.  DDx: Dehydration, electrolyte abnormality, intra-abdominal abscess, GI perforation, AKI, UTI  Patient's presentation is most consistent with acute presentation with potential threat to life or bodily function.  Patient presents with weakness, dehydration, falls.  Has tachycardia, but other vital signs are  normal, not septic.  Labs show UTI, leukocytosis, metabolic derangement with hypokalemia, hypomagnesemia.  Given IV replacement of K and magnesium.  IV fluid bolus for hydration.  Meropenem for UTI given cephalosporin allergy.  Will need to admit for further management, pending CT abdomen pelvis.       FINAL CLINICAL IMPRESSION(S) / ED DIAGNOSES   Final diagnoses:  Generalized weakness  Cystitis  Hypokalemia  Dehydration  Malignant neoplasm of body of pancreas (HCC)     Rx / DC Orders   ED Discharge Orders     None        Note:  This document was prepared using Dragon voice recognition software and may include unintentional dictation errors.   Sharman Cheek, MD 03/17/23 [phone]

## 2023-03-17 NOTE — Assessment & Plan Note (Signed)
Stable from a resp standpoint  Cont home inhalers   

## 2023-03-17 NOTE — Assessment & Plan Note (Addendum)
Noted 6.7 cm cystic lesion on right ovary Will check pelvic ultrasound correlate Some concern for?  Metastasis in the setting of active pancreatic adenocarcinoma Follow-up imaging GYN consult as clinically indicated Oncology consulted

## 2023-03-17 NOTE — Assessment & Plan Note (Addendum)
White count 14.4 on presentation No overt source of infection present noted No dysuria, CT head as well as CT abdomen pelvis grossly stable No hypoxia Noted to be clinically dry Will otherwise monitor for now Reassess if patient spikes a fever or if there is any further clinical decline

## 2023-03-17 NOTE — ED Triage Notes (Signed)
Pt to ED via EMS from home with generalized weakness resulting in multiple falls for a week. Pt missed chemotherapy last week due to fever. Pt reports intermittent fevers this week. Pt reports abdominal pain.    CBG 289 5 mg Oxycodone 20 L AC 250 mL NaCl

## 2023-03-17 NOTE — Progress Notes (Signed)
Per pt, she is not allergic to ensure or cows milk

## 2023-03-17 NOTE — Assessment & Plan Note (Signed)
Blood sugar in upper 200s Sliding-scale insulin Monitor

## 2023-03-18 ENCOUNTER — Other Ambulatory Visit: Payer: Self-pay

## 2023-03-18 ENCOUNTER — Inpatient Hospital Stay: Payer: BC Managed Care – PPO

## 2023-03-18 ENCOUNTER — Inpatient Hospital Stay: Payer: BC Managed Care – PPO | Admitting: Oncology

## 2023-03-18 DIAGNOSIS — Z515 Encounter for palliative care: Secondary | ICD-10-CM | POA: Diagnosis not present

## 2023-03-18 DIAGNOSIS — R531 Weakness: Secondary | ICD-10-CM | POA: Diagnosis not present

## 2023-03-18 LAB — CBC
HCT: 25.8 % — ABNORMAL LOW (ref 36.0–46.0)
Hemoglobin: 8.4 g/dL — ABNORMAL LOW (ref 12.0–15.0)
MCH: 28.5 pg (ref 26.0–34.0)
MCHC: 32.6 g/dL (ref 30.0–36.0)
MCV: 87.5 fL (ref 80.0–100.0)
Platelets: 354 10*3/uL (ref 150–400)
RBC: 2.95 MIL/uL — ABNORMAL LOW (ref 3.87–5.11)
RDW: 14.5 % (ref 11.5–15.5)
WBC: 9.9 10*3/uL (ref 4.0–10.5)
nRBC: 0 % (ref 0.0–0.2)

## 2023-03-18 LAB — HEMOGLOBIN A1C
Hgb A1c MFr Bld: 8.9 % — ABNORMAL HIGH (ref 4.8–5.6)
Mean Plasma Glucose: 208.73 mg/dL

## 2023-03-18 LAB — GLUCOSE, CAPILLARY
Glucose-Capillary: 196 mg/dL — ABNORMAL HIGH (ref 70–99)
Glucose-Capillary: 260 mg/dL — ABNORMAL HIGH (ref 70–99)
Glucose-Capillary: 289 mg/dL — ABNORMAL HIGH (ref 70–99)
Glucose-Capillary: 329 mg/dL — ABNORMAL HIGH (ref 70–99)

## 2023-03-18 LAB — COMPREHENSIVE METABOLIC PANEL
ALT: 31 U/L (ref 0–44)
AST: 23 U/L (ref 15–41)
Albumin: 2 g/dL — ABNORMAL LOW (ref 3.5–5.0)
Alkaline Phosphatase: 155 U/L — ABNORMAL HIGH (ref 38–126)
Anion gap: 6 (ref 5–15)
BUN: 18 mg/dL (ref 6–20)
CO2: 25 mmol/L (ref 22–32)
Calcium: 8.4 mg/dL — ABNORMAL LOW (ref 8.9–10.3)
Chloride: 98 mmol/L (ref 98–111)
Creatinine, Ser: 0.63 mg/dL (ref 0.44–1.00)
GFR, Estimated: >60 mL/min (ref >60)
Glucose, Bld: 188 mg/dL — ABNORMAL HIGH (ref 70–99)
Potassium: 3.2 mmol/L — ABNORMAL LOW (ref 3.5–5.1)
Sodium: 129 mmol/L — ABNORMAL LOW (ref 135–145)
Total Bilirubin: 1.4 mg/dL — ABNORMAL HIGH (ref 0.3–1.2)
Total Protein: 6.3 g/dL — ABNORMAL LOW (ref 6.5–8.1)

## 2023-03-18 MED ORDER — INSULIN GLARGINE-YFGN 100 UNIT/ML ~~LOC~~ SOLN
20.0000 [IU] | Freq: Every day | SUBCUTANEOUS | Status: DC
  Filled 2023-03-18 (×2): qty 0.2

## 2023-03-18 MED ORDER — POTASSIUM CHLORIDE CRYS ER 20 MEQ PO TBCR
40.0000 meq | EXTENDED_RELEASE_TABLET | ORAL | Status: AC
  Administered 2023-03-18: 40 meq via ORAL
  Filled 2023-03-18 (×2): qty 2

## 2023-03-18 NOTE — Plan of Care (Signed)
Patient refused meds and treatment after getting news of disease progression. She slept majority of the day. She states she just wants to go home. Problem: Coping: Goal: Ability to adjust to condition or change in health will improve Outcome: Not Progressing   Problem: Health Behavior/Discharge Planning: Goal: Ability to identify and utilize available resources and services will improve Outcome: Not Progressing   Problem: Nutritional: Goal: Maintenance of adequate nutrition will improve Outcome: Not Progressing   Problem: Metabolic: Goal: Ability to maintain appropriate glucose levels will improve Outcome: Not Progressing

## 2023-03-18 NOTE — Progress Notes (Addendum)
Progress Note   Patient: Caitlyn Roth:[id number] DOB: 13-Sep-1965 DOA: 03/17/2023     1 DOS: the patient was seen and examined on 03/18/2023     Subjective:  Patient seen and examined at bedside this morning Admits to feeling generally weak She is followed up by hospice outpatient and have seen patient this morning Denies abdominal pain nausea vomiting Admits to generalized body pains   Brief hospital course: Caitlyn Roth is a 57 y.o. female with medical history significant of Type II DM, HTN/HLD, CVA (2009), CAD s/p STEMI x 2 s/p DES to RCA (12/27/2022; on DAPT) and recent diagnosis of pancreatic adenocarcinoma s/p ERCP/ CBD stent placement 01/28/23.  Who presents with complaints of weakness, hyponatremia, dehydration over the past 1 to 2 weeks.  Noted admission in the [facility] system September 20 through October 6.  Had biopsy confirmed pancreatic adenocarcinoma.  Has had outpatient follow-up with Dr. Smith Robert with oncology.  Recently started on chemotherapy treatment.  Upon arrival to the emergency room, hemodynamically stable.  Satting well on room air.  White count 14.4, hemoglobin 9.1, platelets 329.  Sodium 126, potassium 2.5, glucose 273, creatinine 0.85, alk phos 162, T. bili 1.6.  Lipase 24.  Magnesium 1.6.  Urinalysis not indicative of infection.  CT head grossly stable.  CT abdomen pelvis with noted increased size of pancreatic head mass, new low-attenuation liver lesions suspicious for hepatic metastasis, multiple small pulmonary nodules in both lung bases consistent with pulmonary metastasis, 6.7 cm cystic lesion in right adnexa.  Hospitalist was therefore contacted to admit patient for further management.  Assessment and Plan: Adult failure to thrive in the setting of pancreatic cancer Worsening weakness in setting of baseline pancreatic cancer with recently being started on chemotherapy Continue rehydration Noted enlarging pancreatic mass on imaging as well as new hepatic  lesions Oncology consulted Follow-up recommendations   Hyponatremia likely secondary to dehydration Patient presented with sodium 126 with glucose of 273 Corrected sodium of 129 Clinically dehydrated Continue IV fluid hydration Monitor electrolyte closely   Hypokalemia We will continue repletion and monitoring  Uncontrolled type 2 diabetes mellitus with hyperglycemia, without long-term current use of insulin (HCC) Blood sugar in upper 200s Sliding-scale insulin Continue to monitor sugar level closely   Ovarian mass Noted 6.7 cm cystic lesion on right ovary Pelvic ultrasound still pending Some concern for possible metastasis in the setting of active pancreatic adenocarcinoma Oncologist on board We will consider GYN consult based on ultrasound findings   Pancreatic adenocarcinoma (HCC) Baseline stage II pancreatic adenocarcinoma followed by Dr. Smith Robert outpatient Recently started on chemotherapy Noted enlarged pancreatic mass as well as new hepatic lesions on CT scan  Oncology notified Follow-up recommendations   Leukocytosis with no active source of infection identified-resolved White count 14.4 on presentation which is now resolved No overt source of infection present noted We will continue to monitor   Asthma-not in acute exacerbation Continue home inhalers     Graves' disease Continue methimazole    GERD without esophagitis Continue PPI therapy   Mixed hyperlipidemia Continue statin therapy   Type 2 diabetes mellitus with hyperglycemia Follow-up on hemoglobin A1c Continue current insulin therapy Monitor glucose level closely   CAD (coronary artery disease) s/p PCI to RCA 12/27/22 Continue aspirin and plavix     Advance Care Planning:   Code Status: Limited: Do not attempt resuscitation (DNR) -DNR-LIMITED -Do Not Intubate/DNI     Consults: Oncology    Family Communication: Friend at the bedside  Physical Exam: General: Middle-age female laying in bed  appears lethargic HENT:     Head: Normocephalic and atraumatic.     Nose: Nose normal.     Mouth/Throat:     Mouth: Mucous membranes are moist.  Eyes:     Pupils: Pupils are equal, round, and reactive to light.  Cardiovascular:     Rate and Rhythm: Normal rate and regular rhythm.  Pulmonary:     Effort: Pulmonary effort is normal.  Abdominal:     General: Bowel sounds are normal.  Musculoskeletal:        General: Normal range of motion.  Skin:    General: Skin is warm.  Neurological:     General: No focal deficit present.  Psychiatric:        Mood and Affect: Mood normal.    Data Reviewed: I have reviewed patient's lab report as shown, vitals, hospice documentation, nursing documentation, abdominal CT scan showing above findings  Status is: Inpatient    Planned Discharge Destination: Hopefully home with hospice  Time spent: 56 minutes spent reviewing patient's above data as well as discussing goals of care with patient, more than 50% of this time was spent at bedside managing patient  Vitals:   03/17/23 1539 03/17/23 1743 03/17/23 2012 03/18/23 0604  BP: 107/65 104/63 103/65 113/77  Pulse: 93 92 96 87  Resp: 15 16 18 19   Temp:  98.3 F (36.8 C) 100.2 F (37.9 C) 98.7 F (37.1 C)  TempSrc:  Oral Oral Oral  SpO2: 100% 99% 96% 99%      Latest Ref Rng & Units 03/18/2023    4:12 AM 03/17/2023    6:43 PM 03/17/2023   10:36 AM  BMP  Glucose 70 - 99 mg/dL 401  027  253   BUN 6 - 20 mg/dL 18  21  26    Creatinine 0.44 - 1.00 mg/dL 6.64  4.03  4.74   Sodium 135 - 145 mmol/L 129  127  126   Potassium 3.5 - 5.1 mmol/L 3.2  2.6  2.5   Chloride 98 - 111 mmol/L 98  93  91   CO2 22 - 32 mmol/L 25  25  24    Calcium 8.9 - 10.3 mg/dL 8.4  8.3  8.4        Latest Ref Rng & Units 03/18/2023    4:12 AM 03/17/2023   10:36 AM 03/05/2023   11:14 AM  CBC  WBC 4.0 - 10.5 K/uL 9.9  14.4  8.3   Hemoglobin 12.0 - 15.0 g/dL 8.4  9.1  25.9   Hematocrit 36.0 - 46.0 % 25.8  27.7  30.9    Platelets 150 - 400 K/uL 354  329  255       Author: Loyce Dys, MD 03/18/2023 11:02 AM  For on call review [email or web address].   Addendum at 5 PM Several attempts made by ultrasound department to do pelvic ultrasound was unsuccessful as patient keeps on refusing.

## 2023-03-18 NOTE — Consult Note (Signed)
Hematology/Oncology Consult note [facility] [phone] [phone]  Patient Care Team: Patrice Paradise, MD as PCP - General (Physician Assistant)   Name of the patient: Caitlyn Roth  [id number]  1966-05-06    Reason for consult: History of borderline resectable pancreatic cancer adenocarcinoma now presenting with possible liver metastases   Requesting physician: Dr. Alvester Morin  Date of visit:03/18/2023   History of presenting illness- Patient is a 57 year old Female was seen by me as a new patient on 02/25/2023.  She has had prolonged hospitalization prior to that at [facility] and was diagnosed with borderline resectable pancreatic cancer with evidence of local regional adenopathy.  She has a history of ischemic heart disease status post stent placement in August 2024 and therefore gemcitabine Abraxane chemotherapy was recommended with a potential curative curative intent over modified FOLFIRINOX chemotherapy.  She received 1 dose of gemcitabine Abraxane chemotherapy and now presents to the hospital with generalized weakness and labs notable for hyponatremia.  She had CT abdomen and pelvis without contrast done which showed multiple small bilateral nodules in the lung bases as well as possible 2 hypoattenuating lesions in the liver concerning for liver metastases.  ECOG PS- 3  Pain scale- 4   Review of systems- Review of Systems  Constitutional:  Positive for malaise/fatigue and weight loss. Negative for chills and fever.  HENT:  Negative for congestion, ear discharge and nosebleeds.   Eyes:  Negative for blurred vision.  Respiratory:  Negative for cough, hemoptysis, sputum production, shortness of breath and wheezing.   Cardiovascular:  Negative for chest pain, palpitations, orthopnea and claudication.  Gastrointestinal:  Negative for abdominal pain, blood in stool, constipation, diarrhea, heartburn, melena, nausea and vomiting.  Genitourinary:   Negative for dysuria, flank pain, frequency, hematuria and urgency.  Musculoskeletal:  Negative for back pain, joint pain and myalgias.  Skin:  Negative for rash.  Neurological:  Negative for dizziness, tingling, focal weakness, seizures, weakness and headaches.  Endo/Heme/Allergies:  Does not bruise/bleed easily.  Psychiatric/Behavioral:  Negative for depression and suicidal ideas. The patient does not have insomnia.     Allergies  Allergen Reactions   Cefdinir Nausea And Vomiting and Other (See Comments)    Swelling of throat.   Milk (Cow) Other (See Comments) and Anaphylaxis    Other reaction(s): OTHER  Difficulty breathing, throat swells   Prednisone     Other reaction(s): Other (See Comments) LOST 40% OF VISION IN ONE EYE  Patient reports she is allergic to "all steroids"   Acyclovir Nausea And Vomiting    Other reaction(s): Unknown   Codeine Nausea Only   Other Nausea Only    Antibiotic (oral) patient unsure which one    Patient Active Problem List   Diagnosis Date Noted   Palliative care encounter 03/18/2023   Weakness 03/17/2023   Ovarian mass 03/17/2023   Asthma 03/17/2023   Leukocytosis 03/17/2023   Pancreatic adenocarcinoma (HCC) 02/26/2023   Jaundice 01/20/2023   Uncontrolled type 2 diabetes mellitus with hyperglycemia, without long-term current use of insulin (HCC) 12/27/2022   Anxiety and depression 12/27/2022   GERD without esophagitis 12/27/2022   Hyponatremia 12/27/2022   Primary osteoarthritis of first carpometacarpal joint of left hand 08/08/2022   Graves' disease 12/28/2018   Tobacco dependence 12/28/2018   Uncontrolled type 2 diabetes mellitus with hyperglycemia (HCC) 12/28/2018   Mixed hyperlipidemia 01/29/2017   Vitamin D deficiency 01/29/2017   ADHD (attention deficit hyperactivity disorder) 01/15/2017   Biceps tendinitis of right upper extremity  08/02/2016   Complete tear of right rotator cuff 08/02/2016   Rotator cuff tendinitis, right  08/02/2016   CAD (coronary artery disease) 02/07/2015   Cerebral infarction (HCC) 11/03/2014   Eczema 11/03/2014   Hypertension 11/03/2014   Hyperthyroidism 11/03/2014   Migraine 11/03/2014   Hypercholesterolemia 11/11/2007   Allergic rhinitis 08/12/2007   Barrett's esophagus without dysplasia 08/12/2007   Gastro-esophageal reflux disease without esophagitis 08/12/2007     Past Medical History:  Diagnosis Date   A-fib [facility])    a.) CHA2DS2VASc = 6 (sex, HTN, CVA/TIA x2,  vascular disease history, T2DM);  b.) rate/rhythm maintained on oral metoprolol succinate; no chronic anticoagulation   Acute ST elevation myocardial infarction (STEMI) of inferior wall (HCC) 01/28/2015   a.) LHC/PCI 01/28/2015 --> 70% pRCA (3.5 x 20 mm Promus Premier)   ADHD (attention deficit hyperactivity disorder)    a.) on amphetamine-dextroamphetamine   Anxiety    Arthritis    Asthma    CAD (coronary artery disease) 01/28/2015   a.) LHC/PCI 01/28/2015 --> 70% pRCA (3.5 x 20 mm Promus Premier DES)   Cancer (HCC)    Diabetic polyneuropathy (HCC)    Diastolic dysfunction 01/28/2015   a.) TTE 01/28/2015: EF 50, inf/post HK, mild BAE, triv MR/TR, G1DD   Eczema    Family history of adverse reaction to anesthesia    a.) delayed emergence in 1st degree relative (father)   [name] corneal dystrophy    GERD (gastroesophageal reflux disease)    Graves disease    a.) on methimazole   Hepatic steatosis    Hyperlipemia    Hypertension    Migraines    Pancreatic cancer (HCC)    Right rotator cuff tendinitis    Sleep apnea    Stroke (HCC) 2009   peripheral vision in left eye has been affected,   T2DM (type 2 diabetes mellitus) (HCC)    Vitamin B 12 deficiency      Past Surgical History:  Procedure Laterality Date   CARPOMETACARPAL (CMC) FUSION OF THUMB Left 08/07/2022   Procedure: LEFT THUMB CMC ARTHROPLASTY;  Surgeon: Christena Flake, MD;  Location: [facility] ORS;  Service: Orthopedics;  Laterality: Left;    CESAREAN SECTION     CHOLECYSTECTOMY     CORONARY ANGIOPLASTY WITH STENT PLACEMENT Left 01/28/2015   Procedure: CORONARY ANGIOPLASTY WITH STENT PLACEMENT; Location: [facility]   CORONARY/GRAFT ACUTE MI REVASCULARIZATION N/A 12/27/2022   Procedure: Coronary/Graft Acute MI Revascularization;  Surgeon: Alwyn Pea, MD;  Location: [facility] INVASIVE CV LAB;  Service: Cardiovascular;  Laterality: N/A;   IR IMAGING GUIDED PORT INSERTION  03/04/2023   KNEE ARTHROSCOPY Left    KNEE SURGERY Left 1990   McKay's procedure   LEFT HEART CATH AND CORONARY ANGIOGRAPHY N/A 12/27/2022   Procedure: LEFT HEART CATH AND CORONARY ANGIOGRAPHY;  Surgeon: Alwyn Pea, MD;  Location: [facility] INVASIVE CV LAB;  Service: Cardiovascular;  Laterality: N/A;   SHOULDER ARTHROSCOPY WITH OPEN ROTATOR CUFF REPAIR Right 10/01/2016   Procedure: SHOULDER ARTHROSCOPY WITH OPEN ROTATOR CUFF REPAIR;  Surgeon: Christena Flake, MD;  Location: [facility] ORS;  Service: Orthopedics;  Laterality: Right;  Shoulder Block    SINUS SURGERY WITH INSTATRAK     TONSILLECTOMY  1978   TUBAL LIGATION      Social History   Socioeconomic History   Marital status: Widowed    Spouse name: Not on file   Number of children: Not on file   Years of education: Not on file   Highest education  level: Not on file  Occupational History   Not on file  Tobacco Use   Smoking status: Every Day    Current packs/day: 0.50    Average packs/day: 0.5 packs/day for 35.0 years (17.5 ttl pk-yrs)    Types: Cigarettes   Smokeless tobacco: Never  Vaping Use   Vaping status: Never Used  Substance and Sexual Activity   Alcohol use: Yes    Comment: rare socially   Drug use: No   Sexual activity: Yes  Other Topics Concern   Not on file  Social History Narrative   Not on file   Social Determinants of Health   Financial Resource Strain: Low Risk  (01/01/2023)   [facility] from [facility]   Overall Financial Resource Strain (CARDIA)    Difficulty of  Paying Living Expenses: Not hard at all  Food Insecurity: No Food Insecurity (03/17/2023)   Hunger Vital Sign    Worried About Running Out of Food in the Last Year: Never true    Ran Out of Food in the Last Year: Never true  Transportation Needs: No Transportation Needs (03/17/2023)   PRAPARE - Administrator, Civil Service (Medical): No    Lack of Transportation (Non-Medical): No  Physical Activity: Not on file  Stress: Not on file  Social Connections: Not on file  Intimate Partner Violence: Not At Risk (03/17/2023)   Humiliation, Afraid, Rape, and Kick questionnaire    Fear of Current or Ex-Partner: No    Emotionally Abused: No    Physically Abused: No    Sexually Abused: No     Family History  Problem Relation Age of Onset   Breast cancer Maternal Aunt 60     Current Facility-Administered Medications:    albuterol (PROVENTIL) (2.5 MG/3ML) 0.083% nebulizer solution 3 mL, 3 mL, Inhalation, Q6H PRN, Floydene Flock, MD   clopidogrel (PLAVIX) tablet 75 mg, 75 mg, Oral, Daily, Floydene Flock, MD, 75 mg at 03/18/23 0912   enoxaparin (LOVENOX) injection 40 mg, 40 mg, Subcutaneous, Q24H, Floydene Flock, MD, 40 mg at 03/17/23 1851   feeding supplement (ENSURE ENLIVE / ENSURE PLUS) liquid 237 mL, 237 mL, Oral, BID BM, Floydene Flock, MD, 237 mL at 03/18/23 1226   HYDROmorphone (DILAUDID) injection 0.5 mg, 0.5 mg, Intravenous, Q4H PRN, Floydene Flock, MD, 0.5 mg at 03/18/23 1610   insulin aspart (novoLOG) injection 0-15 Units, 0-15 Units, Subcutaneous, TID WC, Floydene Flock, MD, 3 Units at 03/18/23 0912   insulin glargine-yfgn (SEMGLEE) injection 20 Units, 20 Units, Subcutaneous, Daily, Djan, Scarlette Calico, MD   methIMAzole (TAPAZOLE) tablet 2.5 mg, 2.5 mg, Oral, Daily, Floydene Flock, MD, 2.5 mg at 03/17/23 2142   ondansetron (ZOFRAN) tablet 4 mg, 4 mg, Oral, Q6H PRN **OR** ondansetron (ZOFRAN) injection 4 mg, 4 mg, Intravenous, Q6H PRN, Floydene Flock, MD    pantoprazole (PROTONIX) EC tablet 40 mg, 40 mg, Oral, Daily, Floydene Flock, MD, 40 mg at 03/18/23 0912   rosuvastatin (CRESTOR) tablet 20 mg, 20 mg, Oral, Daily, Floydene Flock, MD, 20 mg at 03/18/23 0912   Physical exam:  Vitals:   03/17/23 1743 03/17/23 2012 03/18/23 0604 03/18/23 1942  BP: 104/63 103/65 113/77 132/71  Pulse: 92 96 87 95  Resp: 16 18 19 19   Temp: 98.3 F (36.8 C) 100.2 F (37.9 C) 98.7 F (37.1 C) 99.1 F (37.3 C)  TempSrc: Oral Oral Oral   SpO2: 99% 96% 99% 100%  Physical Exam Constitutional:      Comments: Appears frail and fatigued  Cardiovascular:     Rate and Rhythm: Normal rate and regular rhythm.     Heart sounds: Normal heart sounds.  Pulmonary:     Effort: Pulmonary effort is normal.     Breath sounds: Normal breath sounds.  Abdominal:     General: Bowel sounds are normal.     Palpations: Abdomen is soft.  Skin:    General: Skin is warm and dry.  Neurological:     Mental Status: She is alert and oriented to person, place, and time.           Latest Ref Rng & Units 03/18/2023    4:12 AM  CMP  Glucose 70 - 99 mg/dL 621   BUN 6 - 20 mg/dL 18   Creatinine 3.08 - 1.00 mg/dL 6.57   Sodium 846 - 962 mmol/L 129   Potassium 3.5 - 5.1 mmol/L 3.2   Chloride 98 - 111 mmol/L 98   CO2 22 - 32 mmol/L 25   Calcium 8.9 - 10.3 mg/dL 8.4   Total Protein 6.5 - 8.1 g/dL 6.3   Total Bilirubin 0.3 - 1.2 mg/dL 1.4   Alkaline Phos 38 - 126 U/L 155   AST 15 - 41 U/L 23   ALT 0 - 44 U/L 31       Latest Ref Rng & Units 03/18/2023    4:12 AM  CBC  WBC 4.0 - 10.5 K/uL 9.9   Hemoglobin 12.0 - 15.0 g/dL 8.4   Hematocrit 95.2 - 46.0 % 25.8   Platelets 150 - 400 K/uL 354     @IMAGES @  CT ABDOMEN PELVIS WO CONTRAST  Result Date: 03/17/2023 CLINICAL DATA:  Abdominal pain. Recent fall. Fever. Undergoing chemotherapy for pancreatic carcinoma. * Tracking Code: BO * EXAM: CT ABDOMEN AND PELVIS WITHOUT CONTRAST TECHNIQUE: Multidetector CT imaging of  the abdomen and pelvis was performed following the standard protocol without IV contrast. RADIATION DOSE REDUCTION: This exam was performed according to the departmental dose-optimization program which includes automated exposure control, adjustment of the mA and/or kV according to patient size and/or use of iterative reconstruction technique. COMPARISON:  Abdomen only MRI on 01/20/2023 FINDINGS: Lower chest: Multiple small pulmonary nodules are seen in both lung bases, consistent with pulmonary metastases. Hepatobiliary: Evaluation is limited by lack of IV contrast, however, there are at least 2 ill-defined low-attenuation lesions seen in the inferior left and right hepatic lobes measuring approximately 3 cm which are highly suspicious for hepatic metastases. Prior cholecystectomy again noted. A stent is now seen in the common bile duct with pneumobilia. Pancreas: Mass in the pancreatic head appears increased in size since previous study, currently measuring approximately 5.1 x 3.7 cm compared to 3.2 x 2.8 cm previously. This is consistent with enlarging pancreatic carcinoma. Spleen:  Within normal limits in size. Adrenals/Urinary tract: No evidence of urolithiasis or hydronephrosis. Unremarkable unopacified urinary bladder. Stomach/Bowel: No evidence of obstruction, inflammatory process, or abnormal fluid collections. Vascular/Lymphatic: Mildly enlarged lymph node in the porta hepatis measuring 12 mm appears new or increased since previous study, suspicious for metastatic lymphadenopathy. No evidence of abdominal aortic aneurysm. Reproductive: Uterus is unremarkable. A cystic lesion is seen in the right adnexa measuring 6.7 x 4.9 cm which shows at least 1 internal septation, but is not adequately characterized on this unenhanced exam. No evidence of free fluid. Other:  None. Musculoskeletal:  No suspicious bone lesions identified. IMPRESSION: Increased size of  pancreatic head mass, consistent with pancreatic  carcinoma. New low-attenuation liver lesions, highly suspicious for hepatic metastases. Probable mild metastatic lymphadenopathy in porta hepatis. Multiple small pulmonary nodules in both lung bases, consistent with pulmonary metastases. 6.7 cm cystic lesion in right adnexa, not adequately characterized on this unenhanced exam. Cystic ovarian neoplasm cannot be excluded. Consider pelvic ultrasound or MRI for further characterization. Electronically Signed   By: Danae Orleans M.D.   On: 03/17/2023 15:00   CT Head Wo Contrast  Result Date: 03/17/2023 CLINICAL DATA:  Provided history: Head trauma, abnormal mental status. EXAM: CT HEAD WITHOUT CONTRAST TECHNIQUE: Contiguous axial images were obtained from the base of the skull through the vertex without intravenous contrast. RADIATION DOSE REDUCTION: This exam was performed according to the departmental dose-optimization program which includes automated exposure control, adjustment of the mA and/or kV according to patient size and/or use of iterative reconstruction technique. COMPARISON:  Head CT 02/04/2008. FINDINGS: Brain: Mild generalized cerebral atrophy. Patchy and ill-defined hypoattenuation within the cerebral white matter, overall moderate in severity and greater than expected for age. There is no acute intracranial hemorrhage. No demarcated cortical infarct. No extra-axial fluid collection. No evidence of an intracranial mass. No midline shift. Vascular: No hyperdense vessel.  Atherosclerotic calcifications. Skull: No calvarial fracture or aggressive osseous lesion. Sinuses/Orbits: No mass or acute finding within the imaged orbits. Mild mucosal thickening within the right maxillary sinus at the imaged levels. Small mucous retention cyst within the left maxillary sinus at the imaged levels. 6 mm left frontoethmoidal osteoma. IMPRESSION: 1. No evidence of an acute intracranial abnormality. 2. Cerebral white matter disease which is overall moderate in severity  and greater than expected for age. Although nonspecific, this is most often secondary to chronic small vessel ischemia. 3. Mild generalized cerebral atrophy. 4. Paranasal sinus disease at the imaged levels as described. Electronically Signed   By: Jackey Loge D.O.   On: 03/17/2023 14:45   DG Chest 1 View  Result Date: 03/17/2023 CLINICAL DATA:  Weakness.  Falls.  Confusion. EXAM: CHEST  1 VIEW COMPARISON:  12/27/2022. FINDINGS: Low lung volume. Bilateral lung fields are clear. Elevated right hemidiaphragm again seen. Bilateral costophrenic angles are clear. Normal cardio-mediastinal silhouette. No acute osseous abnormalities. The soft tissues are within normal limits. Right-sided CT Port-A-Cath is seen with its tip overlying the cavoatrial junction region. IMPRESSION: *No active disease. Electronically Signed   By: Jules Schick M.D.   On: 03/17/2023 12:27   IR IMAGING GUIDED PORT INSERTION  Result Date: 03/04/2023 CLINICAL DATA:  Pancreatic carcinoma and need for porta cath for chemotherapy. EXAM: IMPLANTED PORT A CATH PLACEMENT WITH ULTRASOUND AND FLUOROSCOPIC GUIDANCE ANESTHESIA/SEDATION: Moderate (conscious) sedation was employed during this procedure. A total of Versed 2.0 mg and Fentanyl 100 mcg was administered intravenously by radiology nursing. Moderate Sedation Time: 37 minutes. The patient's level of consciousness and vital signs were monitored continuously by radiology nursing throughout the procedure under my direct supervision. MEDICATIONS: 1 g IV vancomycin FLUOROSCOPY: 30 seconds.  2.0 mGy. PROCEDURE: The procedure, risks, benefits, and alternatives were explained to the patient. Questions regarding the procedure were encouraged and answered. The patient understands and consents to the procedure. A time-out was performed prior to initiating the procedure. Ultrasound was utilized to confirm patency of the right internal jugular vein. An ultrasound image was saved and recorded. The right  neck and chest were prepped with chlorhexidine in a sterile fashion, and a sterile drape was applied covering the operative field. Maximum barrier sterile  technique with sterile gowns and gloves were used for the procedure. Local anesthesia was provided with 1% lidocaine. After creating a small venotomy incision, a 21 gauge needle was advanced into the right internal jugular vein under direct, real-time ultrasound guidance. Ultrasound image documentation was performed. After securing guidewire access, an 8 Fr dilator was placed. A J-wire was kinked to measure appropriate catheter length. A subcutaneous port pocket was then created along the upper chest wall utilizing sharp and blunt dissection. Portable cautery was utilized. The pocket was irrigated with sterile saline. A single lumen power injectable port was chosen for placement. The 8 Fr catheter was tunneled from the port pocket site to the venotomy incision. The port was placed in the pocket. External catheter was trimmed to appropriate length based on guidewire measurement. At the venotomy, an 8 Fr peel-away sheath was placed over a guidewire. The catheter was then placed through the sheath and the sheath removed. Final catheter positioning was confirmed and documented with a fluoroscopic spot image. The port was accessed with a needle and aspirated and flushed with heparinized saline. The access needle was removed. The venotomy and port pocket incisions were closed with subcutaneous 3-0 Monocryl and subcuticular 4-0 Vicryl. Dermabond was applied to both incisions. COMPLICATIONS: COMPLICATIONS None FINDINGS: After catheter placement, the tip lies at the cavo-atrial junction. The catheter aspirates normally and is ready for immediate use. IMPRESSION: Placement of single lumen port a cath via right internal jugular vein. The catheter tip lies at the cavo-atrial junction. A power injectable port a cath was placed and is ready for immediate use. Electronically  Signed   By: Irish Lack M.D.   On: 03/04/2023 10:04    Assessment and plan- Patient is a 57 y.o. female with history of borderline resectable pancreatic adenocarcinoma status post 1 dose of gemcitabine Abraxane chemotherapy presenting with generalized weakness and scans showing concern for lung and liver metastases  I discussed the results of CT scan with the patient in detail.  CT abdomen shows bilateral lung nodules at the bases and at least 2 concerning liver lesions which are suggestive of metastases.  We discussed that if we have to work this up further ideally patient needs a PET scan as an outpatient.  If stage IV pancreatic cancer is proven on PET scan treatment which will include continuing with gemcitabine Abraxane chemotherapy until progression or toxicity.  Patient has tolerated 1 dose of chemotherapy quite poorly.  She does not wish to continue with any further chemotherapy at this time and does not wish to undergo any further scans.  She wishes to remain comfortable and proceed with home hospice.  Patient lives alone and does not have any family close by.  She has 2 adult children who live outside of [place].  She does not want her children or her mother involved in her care presently.  Palliative care is also seeing the patient today.  Recommend continuing goals of care with the patient and potential discharge home with home hospice.  She is a DNR  Visit Diagnosis 1. Generalized weakness   2. Cystitis   3. Hypokalemia   4. Dehydration   5. Malignant neoplasm of body of pancreas (HCC)     Dr. Owens Shark, MD, MPH [facility] [phone] 03/18/2023

## 2023-03-18 NOTE — Progress Notes (Signed)
Dr Meriam Sprague and RN Favour made aware that [place] called and states that pt refused ordered US, also made aware that pt refused evening insulin and lovenox injection, MD acknowledged

## 2023-03-18 NOTE — Consult Note (Signed)
[facility] at [facility] [phone] [phone]   Name: Caitlyn Roth Date: 03/18/2023 MRN: [id number]  DOB: 15-Mar-1966  Patient Care Team: Patrice Paradise, MD as PCP - General (Physician Assistant)    REASON FOR CONSULTATION: Caitlyn Roth is a 57 y.o. female with multiple medical problems including DM, history of CVA, CAD status post stenting, and pancreatic cancer, initially staged as IIb and borderline resectable who is status post single cycle of gemcitabine and Abraxane.  Patient was admitted to the hospital on 03/17/2023 with weakness with CT of the abdomen and pelvis concerning for new pulmonary and hepatic metastases.  Palliative care was consulted to address goals.  SOCIAL HISTORY:     reports that she has been smoking cigarettes. She has a 17.5 pack-year smoking history. She has never used smokeless tobacco. She reports current alcohol use. She reports that she does not use drugs.  Patient is widowed.  She lives at home with her 4 dogs.  She has a son and daughter, both of whom lives out of state (1 in [place] and the other in [place]).  Patient has a mother who lives nearby.  Patient worked at [employer] in the [employer].  ADVANCE DIRECTIVES:  None on file  CODE STATUS: DNR  PAST MEDICAL HISTORY: Past Medical History:  Diagnosis Date   A-fib (HCC)    a.) CHA2DS2VASc = 6 (sex, HTN, CVA/TIA x2,  vascular disease history, T2DM);  b.) rate/rhythm maintained on oral metoprolol succinate; no chronic anticoagulation   Acute ST elevation myocardial infarction (STEMI) of inferior wall (HCC) 01/28/2015   a.) LHC/PCI 01/28/2015 --> 70% pRCA (3.5 x 20 mm Promus Premier)   ADHD (attention deficit hyperactivity disorder)    a.) on amphetamine-dextroamphetamine   Anxiety    Arthritis    Asthma    CAD (coronary artery disease) 01/28/2015   a.) LHC/PCI 01/28/2015 --> 70% pRCA (3.5 x 20 mm Promus  Premier DES)   Cancer (HCC)    Diabetic polyneuropathy (HCC)    Diastolic dysfunction 01/28/2015   a.) TTE 01/28/2015: EF 50, inf/post HK, mild BAE, triv MR/TR, G1DD   Eczema    Family history of adverse reaction to anesthesia    a.) delayed emergence in 1st degree relative (father)   [name] corneal dystrophy    GERD (gastroesophageal reflux disease)    Graves disease    a.) on methimazole   Hepatic steatosis    Hyperlipemia    Hypertension    Migraines    Pancreatic cancer (HCC)    Right rotator cuff tendinitis    Sleep apnea    Stroke (HCC) 2009   peripheral vision in left eye has been affected,   T2DM (type 2 diabetes mellitus) (HCC)    Vitamin B 12 deficiency     PAST SURGICAL HISTORY:  Past Surgical History:  Procedure Laterality Date   CARPOMETACARPAL (CMC) FUSION OF THUMB Left 08/07/2022   Procedure: LEFT THUMB CMC ARTHROPLASTY;  Surgeon: Christena Flake, MD;  Location: [facility] ORS;  Service: Orthopedics;  Laterality: Left;   CESAREAN SECTION     CHOLECYSTECTOMY     CORONARY ANGIOPLASTY WITH STENT PLACEMENT Left 01/28/2015   Procedure: CORONARY ANGIOPLASTY WITH STENT PLACEMENT; Location: [facility]   CORONARY/GRAFT ACUTE MI REVASCULARIZATION N/A 12/27/2022   Procedure: Coronary/Graft Acute MI Revascularization;  Surgeon: Alwyn Pea, MD;  Location: [facility] INVASIVE CV LAB;  Service: Cardiovascular;  Laterality: N/A;   IR IMAGING GUIDED  PORT INSERTION  03/04/2023   KNEE ARTHROSCOPY Left    KNEE SURGERY Left 1990   McKay's procedure   LEFT HEART CATH AND CORONARY ANGIOGRAPHY N/A 12/27/2022   Procedure: LEFT HEART CATH AND CORONARY ANGIOGRAPHY;  Surgeon: Alwyn Pea, MD;  Location: [facility] INVASIVE CV LAB;  Service: Cardiovascular;  Laterality: N/A;   SHOULDER ARTHROSCOPY WITH OPEN ROTATOR CUFF REPAIR Right 10/01/2016   Procedure: SHOULDER ARTHROSCOPY WITH OPEN ROTATOR CUFF REPAIR;  Surgeon: Christena Flake, MD;  Location: [facility] ORS;  Service: Orthopedics;  Laterality: Right;   Shoulder Block    SINUS SURGERY WITH INSTATRAK     TONSILLECTOMY  1978   TUBAL LIGATION      HEMATOLOGY/ONCOLOGY HISTORY:  Oncology History  Pancreatic adenocarcinoma (HCC)  02/25/2023 Cancer Staging   Staging form: Exocrine Pancreas, AJCC 8th Edition - Clinical stage from 02/25/2023: Stage IIB (cT2, cN1, cM0) - Signed by Creig Hines, MD on 02/26/2023   02/26/2023 Initial Diagnosis   Pancreatic adenocarcinoma (HCC)   03/05/2023 -  Chemotherapy   Patient is on Treatment Plan : PANCREATIC Abraxane D1,8,15 + Gemcitabine D1,8,15 q28d       ALLERGIES:  is allergic to cefdinir, milk (cow), prednisone, acyclovir, codeine, and other.  MEDICATIONS:  Current Facility-Administered Medications  Medication Dose Route Frequency Provider Last Rate Last Admin   0.9 % NaCl with KCl 20 mEq/ L  infusion   Intravenous Continuous Floydene Flock, MD 100 mL/hr at 03/17/23 1853 New Bag at 03/17/23 1853   albuterol (PROVENTIL) (2.5 MG/3ML) 0.083% nebulizer solution 3 mL  3 mL Inhalation Q6H PRN Floydene Flock, MD       clopidogrel (PLAVIX) tablet 75 mg  75 mg Oral Daily Floydene Flock, MD   75 mg at 03/18/23 0912   enoxaparin (LOVENOX) injection 40 mg  40 mg Subcutaneous Q24H Floydene Flock, MD   40 mg at 03/17/23 1851   feeding supplement (ENSURE ENLIVE / ENSURE PLUS) liquid 237 mL  237 mL Oral BID BM Floydene Flock, MD   237 mL at 03/18/23 0917   HYDROmorphone (DILAUDID) injection 0.5 mg  0.5 mg Intravenous Q4H PRN Floydene Flock, MD   0.5 mg at 03/18/23 2952   insulin aspart (novoLOG) injection 0-15 Units  0-15 Units Subcutaneous TID WC Floydene Flock, MD   3 Units at 03/18/23 0912   methIMAzole (TAPAZOLE) tablet 2.5 mg  2.5 mg Oral Daily Floydene Flock, MD   2.5 mg at 03/17/23 2142   ondansetron (ZOFRAN) tablet 4 mg  4 mg Oral Q6H PRN Floydene Flock, MD       Or   ondansetron [facility]) injection 4 mg  4 mg Intravenous Q6H PRN Floydene Flock, MD       pantoprazole (PROTONIX) EC  tablet 40 mg  40 mg Oral Daily Floydene Flock, MD   40 mg at 03/18/23 0912   potassium chloride SA (KLOR-CON M) CR tablet 40 mEq  40 mEq Oral Q4H Rosezetta Schlatter T, MD   40 mEq at 03/18/23 0912   rosuvastatin (CRESTOR) tablet 20 mg  20 mg Oral Daily Floydene Flock, MD   20 mg at 03/18/23 0912    VITAL SIGNS: BP 113/77 (BP Location: Left Arm)   Pulse 87   Temp 98.7 F (37.1 C) (Oral)   Resp 19   LMP 09/21/2014   SpO2 99%  There were no vitals filed for this visit.  Estimated body mass index is  20.97 kg/m as calculated from the following:   Height as of 03/04/23: 5\' 9"  (1.753 m).   Weight as of 03/05/23: 142 lb (64.4 kg).  LABS: CBC:    Component Value Date/Time   WBC 9.9 03/18/2023 0412   HGB 8.4 (L) 03/18/2023 0412   HGB 10.2 (L) 03/05/2023 1114   HGB 13.8 01/29/2017 0835   HCT 25.8 (L) 03/18/2023 0412   HCT 40.8 01/29/2017 0835   PLT 354 03/18/2023 0412   PLT 255 03/05/2023 1114   PLT 310 01/29/2017 0835   MCV 87.5 03/18/2023 0412   MCV 89 01/29/2017 0835   MCV 91 05/13/2012 2259   NEUTROABS 12.1 (H) 03/17/2023 1036   NEUTROABS 10.9 (H) 01/29/2017 0835   LYMPHSABS 0.7 03/17/2023 1036   LYMPHSABS 3.0 01/29/2017 0835   MONOABS 1.3 (H) 03/17/2023 1036   EOSABS 0.1 03/17/2023 1036   EOSABS 0.0 01/29/2017 0835   BASOSABS 0.1 03/17/2023 1036   BASOSABS 0.0 01/29/2017 0835   Comprehensive Metabolic Panel:    Component Value Date/Time   NA 129 (L) 03/18/2023 0412   NA 142 01/29/2017 0835   NA 138 05/13/2012 2259   K 3.2 (L) 03/18/2023 0412   K 3.7 05/13/2012 2259   CL 98 03/18/2023 0412   CL 102 05/13/2012 2259   CO2 25 03/18/2023 0412   CO2 26 05/13/2012 2259   BUN 18 03/18/2023 0412   BUN 18 01/29/2017 0835   BUN 8 05/13/2012 2259   CREATININE 0.63 03/18/2023 0412   CREATININE 0.88 03/05/2023 1114   CREATININE 0.85 05/13/2012 2259   GLUCOSE 188 (H) 03/18/2023 0412   GLUCOSE 145 (H) 05/13/2012 2259   CALCIUM 8.4 (L) 03/18/2023 0412   CALCIUM 8.9 05/13/2012  2259   AST 23 03/18/2023 0412   AST 22 03/05/2023 1114   ALT 31 03/18/2023 0412   ALT 37 03/05/2023 1114   ALT 40 05/13/2012 2259   ALKPHOS 155 (H) 03/18/2023 0412   ALKPHOS 119 05/13/2012 2259   BILITOT 1.4 (H) 03/18/2023 0412   BILITOT 1.3 (H) 03/05/2023 1114   PROT 6.3 (L) 03/18/2023 0412   PROT 7.1 01/29/2017 0835   PROT 7.4 05/13/2012 2259   ALBUMIN 2.0 (L) 03/18/2023 0412   ALBUMIN 4.8 01/29/2017 0835   ALBUMIN 3.8 05/13/2012 2259    RADIOGRAPHIC STUDIES: CT ABDOMEN PELVIS WO CONTRAST  Result Date: 03/17/2023 CLINICAL DATA:  Abdominal pain. Recent fall. Fever. Undergoing chemotherapy for pancreatic carcinoma. * Tracking Code: BO * EXAM: CT ABDOMEN AND PELVIS WITHOUT CONTRAST TECHNIQUE: Multidetector CT imaging of the abdomen and pelvis was performed following the standard protocol without IV contrast. RADIATION DOSE REDUCTION: This exam was performed according to the departmental dose-optimization program which includes automated exposure control, adjustment of the mA and/or kV according to patient size and/or use of iterative reconstruction technique. COMPARISON:  Abdomen only MRI on 01/20/2023 FINDINGS: Lower chest: Multiple small pulmonary nodules are seen in both lung bases, consistent with pulmonary metastases. Hepatobiliary: Evaluation is limited by lack of IV contrast, however, there are at least 2 ill-defined low-attenuation lesions seen in the inferior left and right hepatic lobes measuring approximately 3 cm which are highly suspicious for hepatic metastases. Prior cholecystectomy again noted. A stent is now seen in the common bile duct with pneumobilia. Pancreas: Mass in the pancreatic head appears increased in size since previous study, currently measuring approximately 5.1 x 3.7 cm compared to 3.2 x 2.8 cm previously. This is consistent with enlarging pancreatic carcinoma. Spleen:  Within  normal limits in size. Adrenals/Urinary tract: No evidence of urolithiasis or  hydronephrosis. Unremarkable unopacified urinary bladder. Stomach/Bowel: No evidence of obstruction, inflammatory process, or abnormal fluid collections. Vascular/Lymphatic: Mildly enlarged lymph node in the porta hepatis measuring 12 mm appears new or increased since previous study, suspicious for metastatic lymphadenopathy. No evidence of abdominal aortic aneurysm. Reproductive: Uterus is unremarkable. A cystic lesion is seen in the right adnexa measuring 6.7 x 4.9 cm which shows at least 1 internal septation, but is not adequately characterized on this unenhanced exam. No evidence of free fluid. Other:  None. Musculoskeletal:  No suspicious bone lesions identified. IMPRESSION: Increased size of pancreatic head mass, consistent with pancreatic carcinoma. New low-attenuation liver lesions, highly suspicious for hepatic metastases. Probable mild metastatic lymphadenopathy in porta hepatis. Multiple small pulmonary nodules in both lung bases, consistent with pulmonary metastases. 6.7 cm cystic lesion in right adnexa, not adequately characterized on this unenhanced exam. Cystic ovarian neoplasm cannot be excluded. Consider pelvic ultrasound or MRI for further characterization. Electronically Signed   By: Danae Orleans M.D.   On: 03/17/2023 15:00   CT Head Wo Contrast  Result Date: 03/17/2023 CLINICAL DATA:  Provided history: Head trauma, abnormal mental status. EXAM: CT HEAD WITHOUT CONTRAST TECHNIQUE: Contiguous axial images were obtained from the base of the skull through the vertex without intravenous contrast. RADIATION DOSE REDUCTION: This exam was performed according to the departmental dose-optimization program which includes automated exposure control, adjustment of the mA and/or kV according to patient size and/or use of iterative reconstruction technique. COMPARISON:  Head CT 02/04/2008. FINDINGS: Brain: Mild generalized cerebral atrophy. Patchy and ill-defined hypoattenuation within the cerebral white  matter, overall moderate in severity and greater than expected for age. There is no acute intracranial hemorrhage. No demarcated cortical infarct. No extra-axial fluid collection. No evidence of an intracranial mass. No midline shift. Vascular: No hyperdense vessel.  Atherosclerotic calcifications. Skull: No calvarial fracture or aggressive osseous lesion. Sinuses/Orbits: No mass or acute finding within the imaged orbits. Mild mucosal thickening within the right maxillary sinus at the imaged levels. Small mucous retention cyst within the left maxillary sinus at the imaged levels. 6 mm left frontoethmoidal osteoma. IMPRESSION: 1. No evidence of an acute intracranial abnormality. 2. Cerebral white matter disease which is overall moderate in severity and greater than expected for age. Although nonspecific, this is most often secondary to chronic small vessel ischemia. 3. Mild generalized cerebral atrophy. 4. Paranasal sinus disease at the imaged levels as described. Electronically Signed   By: Jackey Loge D.O.   On: 03/17/2023 14:45   DG Chest 1 View  Result Date: 03/17/2023 CLINICAL DATA:  Weakness.  Falls.  Confusion. EXAM: CHEST  1 VIEW COMPARISON:  12/27/2022. FINDINGS: Low lung volume. Bilateral lung fields are clear. Elevated right hemidiaphragm again seen. Bilateral costophrenic angles are clear. Normal cardio-mediastinal silhouette. No acute osseous abnormalities. The soft tissues are within normal limits. Right-sided CT Port-A-Cath is seen with its tip overlying the cavoatrial junction region. IMPRESSION: *No active disease. Electronically Signed   By: Jules Schick M.D.   On: 03/17/2023 12:27   IR IMAGING GUIDED PORT INSERTION  Result Date: 03/04/2023 CLINICAL DATA:  Pancreatic carcinoma and need for porta cath for chemotherapy. EXAM: IMPLANTED PORT A CATH PLACEMENT WITH ULTRASOUND AND FLUOROSCOPIC GUIDANCE ANESTHESIA/SEDATION: Moderate (conscious) sedation was employed during this procedure. A  total of Versed 2.0 mg and Fentanyl 100 mcg was administered intravenously by radiology nursing. Moderate Sedation Time: 37 minutes. The patient's level of consciousness  and vital signs were monitored continuously by radiology nursing throughout the procedure under my direct supervision. MEDICATIONS: 1 g IV vancomycin FLUOROSCOPY: 30 seconds.  2.0 mGy. PROCEDURE: The procedure, risks, benefits, and alternatives were explained to the patient. Questions regarding the procedure were encouraged and answered. The patient understands and consents to the procedure. A time-out was performed prior to initiating the procedure. Ultrasound was utilized to confirm patency of the right internal jugular vein. An ultrasound image was saved and recorded. The right neck and chest were prepped with chlorhexidine in a sterile fashion, and a sterile drape was applied covering the operative field. Maximum barrier sterile technique with sterile gowns and gloves were used for the procedure. Local anesthesia was provided with 1% lidocaine. After creating a small venotomy incision, a 21 gauge needle was advanced into the right internal jugular vein under direct, real-time ultrasound guidance. Ultrasound image documentation was performed. After securing guidewire access, an 8 Fr dilator was placed. A J-wire was kinked to measure appropriate catheter length. A subcutaneous port pocket was then created along the upper chest wall utilizing sharp and blunt dissection. Portable cautery was utilized. The pocket was irrigated with sterile saline. A single lumen power injectable port was chosen for placement. The 8 Fr catheter was tunneled from the port pocket site to the venotomy incision. The port was placed in the pocket. External catheter was trimmed to appropriate length based on guidewire measurement. At the venotomy, an 8 Fr peel-away sheath was placed over a guidewire. The catheter was then placed through the sheath and the sheath removed.  Final catheter positioning was confirmed and documented with a fluoroscopic spot image. The port was accessed with a needle and aspirated and flushed with heparinized saline. The access needle was removed. The venotomy and port pocket incisions were closed with subcutaneous 3-0 Monocryl and subcuticular 4-0 Vicryl. Dermabond was applied to both incisions. COMPLICATIONS: COMPLICATIONS None FINDINGS: After catheter placement, the tip lies at the cavo-atrial junction. The catheter aspirates normally and is ready for immediate use. IMPRESSION: Placement of single lumen port a cath via right internal jugular vein. The catheter tip lies at the cavo-atrial junction. A power injectable port a cath was placed and is ready for immediate use. Electronically Signed   By: Irish Lack M.D.   On: 03/04/2023 10:04    PERFORMANCE STATUS (ECOG) : 2 - Symptomatic, <50% confined to bed  Review of Systems Unless otherwise noted, a complete review of systems is negative.  Physical Exam General: Frail appearing Pulmonary: Unlabored Extremities: no edema, no joint deformities Skin: no rashes Neurological: Weakness but otherwise nonfocal  IMPRESSION: Patient recently hospitalized at [facility] 02/07/2023 to 02/23/2023 with fever found to have interval development of multiple peripancreatic collections concerning for pancreatitis versus worsening local disease.  She was managed conservatively.  She was discharged and received cycle 1 gemcitabine and Abraxane on 03/05/2023.  Now admitted with weakness.  Unfortunately, CTs appear consistent with disease progression with new pulmonary and hepatic metastases.  Discussed workup with patient.  She says that she does not think that she is interested in any further chemotherapy.  She says that she was not keen on the idea of cancer treatment even when her pancreatic cancer was felt to be borderline resectable.  Patient clarifies that she agreed to chemotherapy to appease her mother.   However, in light of new imaging concerning for disease progression, she says that she would prefer just to focus on comfort at home.  She is familiar with  hospice who was involved with the care of her husband prior to his passing.  Patient says that she would be in agreement with hospice involvement.  Patient declined my offer to call family.  I am concerned that patient is somewhat frail appearing.  She lives at home alone and it sounds like she has limited family support locally.   Symptomatically, she complains of pain all over but was reluctant to allow me to call the nurse to give pain medications.  Patient declined my offer to adjust or liberalize her pain regimen.  PLAN: -Best supportive care -Probable hospice involvement at time of discharge -Agree with DNR  Case and plan discussed with Dr. Smith Robert   Time Total: 50 minutes  Visit consisted of counseling and education dealing with the complex and emotionally intense issues of symptom management and palliative care in the setting of serious and potentially life-threatening illness.Greater than 50%  of this time was spent counseling and coordinating care related to the above assessment and plan.  Signed by: Laurette Schimke, PhD, NP-C

## 2023-03-19 DIAGNOSIS — R531 Weakness: Secondary | ICD-10-CM | POA: Diagnosis not present

## 2023-03-19 LAB — CBC WITH DIFFERENTIAL/PLATELET
Abs Immature Granulocytes: 0.15 10*3/uL — ABNORMAL HIGH (ref 0.00–0.07)
Basophils Absolute: 0.1 10*3/uL (ref 0.0–0.1)
Basophils Relative: 1 %
Eosinophils Absolute: 0 10*3/uL (ref 0.0–0.5)
Eosinophils Relative: 0 %
HCT: 27.2 % — ABNORMAL LOW (ref 36.0–46.0)
Hemoglobin: 8.8 g/dL — ABNORMAL LOW (ref 12.0–15.0)
Immature Granulocytes: 2 %
Lymphocytes Relative: 10 %
Lymphs Abs: 0.9 10*3/uL (ref 0.7–4.0)
MCH: 28.7 pg (ref 26.0–34.0)
MCHC: 32.4 g/dL (ref 30.0–36.0)
MCV: 88.6 fL (ref 80.0–100.0)
Monocytes Absolute: 0.6 10*3/uL (ref 0.1–1.0)
Monocytes Relative: 7 %
Neutro Abs: 7.1 10*3/uL (ref 1.7–7.7)
Neutrophils Relative %: 80 %
Platelets: 470 10*3/uL — ABNORMAL HIGH (ref 150–400)
RBC: 3.07 MIL/uL — ABNORMAL LOW (ref 3.87–5.11)
RDW: 14.9 % (ref 11.5–15.5)
WBC: 8.7 10*3/uL (ref 4.0–10.5)
nRBC: 0 % (ref 0.0–0.2)

## 2023-03-19 LAB — BASIC METABOLIC PANEL
Anion gap: 8 (ref 5–15)
BUN: 11 mg/dL (ref 6–20)
CO2: 26 mmol/L (ref 22–32)
Calcium: 8.4 mg/dL — ABNORMAL LOW (ref 8.9–10.3)
Chloride: 97 mmol/L — ABNORMAL LOW (ref 98–111)
Creatinine, Ser: 0.56 mg/dL (ref 0.44–1.00)
GFR, Estimated: >60 mL/min (ref >60)
Glucose, Bld: 334 mg/dL — ABNORMAL HIGH (ref 70–99)
Potassium: 3.5 mmol/L (ref 3.5–5.1)
Sodium: 131 mmol/L — ABNORMAL LOW (ref 135–145)

## 2023-03-19 LAB — GLUCOSE, CAPILLARY
Glucose-Capillary: 294 mg/dL — ABNORMAL HIGH (ref 70–99)
Glucose-Capillary: 350 mg/dL — ABNORMAL HIGH (ref 70–99)

## 2023-03-19 MED ORDER — SODIUM CHLORIDE (PF) 0.9 % IJ SOLN
1.5000 mg | INTRAVENOUS | Status: DC
  Filled 2023-03-19: qty 0.3

## 2023-03-19 MED ORDER — OXYCODONE HCL 5 MG PO TABS: 5.0000 mg | ORAL_TABLET | Freq: Four times a day (QID) | ORAL | 0 refills | Status: DC | PRN

## 2023-03-19 MED ORDER — OXYCODONE HCL 5 MG PO TABS
10.0000 mg | ORAL_TABLET | Freq: Once | ORAL | Status: AC
  Administered 2023-03-19: 10 mg via ORAL
  Filled 2023-03-19: qty 2

## 2023-03-19 MED ORDER — METOPROLOL SUCCINATE ER 50 MG PO TB24: 100.0000 mg | ORAL_TABLET | Freq: Every day | ORAL | Status: DC

## 2023-03-19 NOTE — Progress Notes (Signed)
Patient is complaining of midsternum chest pain, she is actively moaning.   RN paged Dr. Para March to notify her of the new onset chest pain.   BP 121/85 HR 94 O2 sat 98% on RA.   Patient is refusing EKG, patient is refusing nitroglycerin, patient is refusing all medications and is refusing further work up.   Patient states "I just want to go home, call Talmadge Chad my POA to come get me"   RN notified the physician of the patient's refusals of further chest pain work up. RN called Talmadge Chad per the patient's request. Patient continues to refuse further work up and refuses medication to help relieve her pain.

## 2023-03-19 NOTE — Plan of Care (Signed)
  Problem: Education: Goal: Ability to describe self-care measures that may prevent or decrease complications (Diabetes Survival Skills Education) will improve Outcome: Progressing Goal: Individualized Educational Video(s) Outcome: Progressing   Problem: Coping: Goal: Ability to adjust to condition or change in health will improve Outcome: Progressing   Problem: Fluid Volume: Goal: Ability to maintain a balanced intake and output will improve Outcome: Progressing   Problem: Health Behavior/Discharge Planning: Goal: Ability to identify and utilize available resources and services will improve Outcome: Progressing Goal: Ability to manage health-related needs will improve Outcome: Progressing   Problem: Metabolic: Goal: Ability to maintain appropriate glucose levels will improve Outcome: Progressing   Problem: Nutritional: Goal: Maintenance of adequate nutrition will improve Outcome: Progressing Goal: Progress toward achieving an optimal weight will improve Outcome: Progressing   Problem: Skin Integrity: Goal: Risk for impaired skin integrity will decrease Outcome: Progressing   Problem: Tissue Perfusion: Goal: Adequacy of tissue perfusion will improve Outcome: Progressing   Problem: Education: Goal: Knowledge of General Education information will improve Description: Including pain rating scale, medication(s)/side effects and non-pharmacologic comfort measures Outcome: Progressing   Problem: Health Behavior/Discharge Planning: Goal: Ability to manage health-related needs will improve Outcome: Progressing   Problem: Clinical Measurements: Goal: Ability to maintain clinical measurements within normal limits will improve Outcome: Progressing Goal: Will remain free from infection Outcome: Progressing Goal: Diagnostic test results will improve Outcome: Progressing Goal: Respiratory complications will improve Outcome: Progressing Goal: Cardiovascular complication will  be avoided Outcome: Progressing   Problem: Activity: Goal: Risk for activity intolerance will decrease Outcome: Progressing   Problem: Nutrition: Goal: Adequate nutrition will be maintained Outcome: Progressing   Problem: Coping: Goal: Level of anxiety will decrease Outcome: Progressing   Problem: Elimination: Goal: Will not experience complications related to bowel motility Outcome: Progressing Goal: Will not experience complications related to urinary retention Outcome: Progressing   Problem: Pain Management: Goal: General experience of comfort will improve Outcome: Progressing   Problem: Safety: Goal: Ability to remain free from injury will improve Outcome: Progressing   Problem: Skin Integrity: Goal: Risk for impaired skin integrity will decrease Outcome: Progressing   Problem: Education: Goal: Knowledge of General Education information will improve Description: Including pain rating scale, medication(s)/side effects and non-pharmacologic comfort measures Outcome: Progressing

## 2023-03-19 NOTE — Progress Notes (Signed)
Nutrition Brief Note  Chart reviewed. Per MD and palliative care notes, pt declining further interventions and work-up. Plan to d/c home with hospice; focus of care on comfort.  No further nutrition interventions planned at this time.  Please re-consult as needed.   Levada Schilling, RD, LDN, CDCES Registered Dietitian III Certified Diabetes Care and Education Specialist Please refer to [facility] for RD and/or RD on-call/weekend/after hours pager

## 2023-03-19 NOTE — Progress Notes (Addendum)
CROSS COVER NOTE  NAME: Caitlyn Roth MRN: [id number] DOB : 1965/08/28    Concern as stated by nurse / staff   Patient is complaining of midsternum chest pain, she is actively moaning.   Patient is refusing EKG, patient is refusing nitroglycerin, patient is refusing all medications and is refusing further work up.    Patient states "I just want to go home, call Talmadge Chad my POA to come get me"    RN notified the physician of the patient's refusals of further chest pain work up. RN called Talmadge Chad per the patient's request. Patient continues to refuse further work up and refuses medication to help relieve her pain.                Pertinent findings on chart review: Last progress note and note from oncologist reviewed: - History of MI s/p stent August 2024 -Borderline resectable pancreatic adenocarcinoma with lung and liver metastases - Plan for discharge with home hospice  Assessment and  Interventions   Assessment:  Chest pain, refusing intervention  Plan: Patient is of sound mind, DNR/DNI going with home hospice.  I think it is fair to respect patient wishes for no intervention,  however if she changes mind we will treat Sublingual nitroglycerin as needed ordered for chest pain if patient agreeable.  No blood work or EKG per patient preference X

## 2023-03-19 NOTE — Progress Notes (Signed)
[facility] Liaison Note  Received request from Jonetta Speak, RN, Transitions of Care Manager, for hospice services at home after discharge.  Spoke with patient, sister, Amy, and friend, Misty Stanley,  to initiate education related to hospice philosophy, services, and team approach to care.  Patient, sister, and friend verbalized understanding of information given.  Per discussion, the plan is for discharge home by EMS today.      DME needs discussed.  Patient has the following equipment in the home:  Walker and wheelchair. Patient/family requests the following equipment in the home: [facility] and shower chair.  The address has been verified and is correct in the chart.  Talmadge Chad and phone number [phone]  is the family contact to arrange time of equipment delivery.    Please send signed and completed DNR home with the patient/family.  Please provide prescriptions at discharge as needed to ensure ongoing symptom management.   [facility] information and contact numbers given to patient and friend.  Above information shared with Jonetta Speak, RN, Transitions of Care Manager.     Please call with any Hospice related questions or concerns.  Thank you for the opportunity to participate in this patient's care.  Redge Gainer, [facility] Liaison [phone]

## 2023-03-19 NOTE — Discharge Summary (Signed)
Physician Discharge Summary   Patient: Caitlyn Roth MRN: [id number]  DOB: [birth year]   Admit:     Date of Admission: 03/17/2023 Admitted from: home   Discharge: Date of discharge: 03/19/23 Disposition: Hospice care Condition at discharge: poor  CODE STATUS: DNR     Discharge Physician: Sunnie Nielsen, DO [facility]     PCP: Patrice Paradise, MD  Recommendations for Outpatient Follow-up:  Follow up with PCP Patrice Paradise, MD or hospice provider ASAP Please obtain labs/tests: as needed Please follow up on the following pending results: none Follow up per hospice  Follow up with oncology if desired, but patient has made it clear she is not desiring further cancer treatment at this time    Discharge Instructions     Call MD for:  severe uncontrolled pain   Complete by: As directed    Increase activity slowly   Complete by: As directed          Discharge Diagnoses: Principal Problem:   Weakness Active Problems:   Uncontrolled type 2 diabetes mellitus with hyperglycemia, without long-term current use of insulin (HCC)   Hyponatremia   Pancreatic adenocarcinoma (HCC)   Ovarian mass   CAD (coronary artery disease)   Mixed hyperlipidemia   GERD without esophagitis   Graves' disease   Asthma   Leukocytosis   Palliative care encounter       Hospital Course:  Caitlyn Roth is a 57 y.o. female with medical history significant of Type II DM, HTN/HLD, CVA (2009), CAD s/p STEMI x 2 s/p DES to RCA (12/27/2022; on DAPT) and recent diagnosis of pancreatic adenocarcinoma s/p ERCP/ CBD stent placement 01/28/23 with weakness, hyponatremia, dehydration.  History from patient as well as friend at the bedside.  Per report, patient with worsening weakness over the past 1 to 2 weeks.  Noted admission in the [facility] system September 20 through October 6.  Had biopsy confirmed pancreatic adenocarcinoma.  Has had outpatient follow-up with Dr. Smith Robert with  oncology.  Recently started on chemotherapy treatment.  Per report, patient with worsening weakness, decreased p.o. intake.  No chest pain or shortness of breath.  Minimal to mild lower abdominal pain.  No urea.  No focal hemiparesis or confusion.  Weakness has progressively worsened per the friend.  Per patient and friend, patient has had 1 treatment of chemotherapy thus far which was not well tolerated. Admitted to hospitalist service 03/17/23. Oncology and Palliative care consulted. Decision made for comfort measures / home with hospice which was arranged and pt was discharged home 03/19/23       Discharge Instructions  Allergies as of 03/19/2023       Reactions   Cefdinir Nausea And Vomiting, Other (See Comments)   Swelling of throat.   Milk (cow) Other (See Comments), Anaphylaxis   Other reaction(s): OTHER Difficulty breathing, throat swells   Prednisone    Other reaction(s): Other (See Comments) LOST 40% OF VISION IN ONE EYE Patient reports she is allergic to "all steroids"   Acyclovir Nausea And Vomiting   Other reaction(s): Unknown   Codeine Nausea Only   Other Nausea Only   Antibiotic (oral) patient unsure which one        Medication List     STOP taking these medications    glipiZIDE 5 MG tablet Commonly known as: GLUCOTROL   Jardiance 25 MG Tabs tablet Generic drug: empagliflozin   lisinopril 5 MG tablet Commonly known as: ZESTRIL   metFORMIN  500 MG 24 hr tablet Commonly known as: GLUCOPHAGE-XR       TAKE these medications    albuterol 108 (90 Base) MCG/ACT inhaler Commonly known as: VENTOLIN HFA Inhale 2 puffs into the lungs every 6 (six) hours as needed for wheezing or shortness of breath.   amLODipine 10 MG tablet Commonly known as: NORVASC Take 1 tablet by mouth daily.   amphetamine-dextroamphetamine 30 MG 24 hr capsule Commonly known as: ADDERALL XR Take 30 mg by mouth daily.   B-12 1000 MCG/ML Kit Inject 1 mL as directed once a week.    Basaglar KwikPen 100 UNIT/ML Inject up to 40 units daily. Take as directed.   Brilinta 90 MG Tabs tablet Generic drug: ticagrelor Take 1 tablet (90 mg total) by mouth 2 (two) times daily.   brimonidine 0.2 % ophthalmic solution Commonly known as: ALPHAGAN Place 1 drop into the right eye 3 (three) times daily.   busPIRone 5 MG tablet Commonly known as: BUSPAR Take 5 mg by mouth 3 (three) times daily.   clopidogrel 75 MG tablet Commonly known as: PLAVIX Take by mouth.   desloratadine 5 MG tablet Commonly known as: CLARINEX Take 5 mg by mouth daily.   gabapentin 300 MG capsule Commonly known as: NEURONTIN Take 600 mg by mouth at bedtime as needed (neuropathy).   Insulin Aspart FlexPen 100 UNIT/ML Commonly known as: NOVOLOG Inject 50 Units into the skin daily. Daily as directed   lidocaine-prilocaine cream Commonly known as: EMLA Apply to affected area once   losartan 50 MG tablet Commonly known as: COZAAR Take 1 tablet by mouth daily.   loteprednol 0.5 % ophthalmic suspension Commonly known as: LOTEMAX Place 1 drop into the right eye daily.   methimazole 5 MG tablet Commonly known as: TAPAZOLE Take 2.5 mg by mouth at bedtime.   metoprolol succinate 50 MG 24 hr tablet Commonly known as: TOPROL-XL Take 2 tablets (100 mg total) by mouth daily. Take with or immediately following a meal.   montelukast 10 MG tablet Commonly known as: SINGULAIR Take 10 mg by mouth at bedtime.   ondansetron 8 MG tablet Commonly known as: Zofran Take 1 tablet (8 mg total) by mouth every 8 (eight) hours as needed for nausea or vomiting.   oxyCODONE 5 MG immediate release tablet Commonly known as: Oxy IR/ROXICODONE Take 1-2 tablets (5-10 mg total) by mouth every 6 (six) hours as needed for moderate pain (pain score 4-6) or severe pain (pain score 7-10). May crush, mix with water and give sublingually if needed.   pantoprazole 40 MG tablet Commonly known as: PROTONIX Take 40 mg by  mouth at bedtime.   prochlorperazine 10 MG tablet Commonly known as: COMPAZINE Take by mouth.   prochlorperazine 10 MG tablet Commonly known as: COMPAZINE Take 1 tablet (10 mg total) by mouth every 6 (six) hours as needed for nausea or vomiting.   rosuvastatin 20 MG tablet Commonly known as: CRESTOR Take 20 mg by mouth daily.   timolol 0.5 % ophthalmic solution Commonly known as: BETIMOL Place 1 drop into the right eye 2 (two) times daily.   venlafaxine XR 75 MG 24 hr capsule Commonly known as: EFFEXOR-XR Take 225 mg by mouth daily with breakfast.               Durable Medical Equipment  (From admission, onward)           Start     Ordered   03/19/23 1117  For home use only  DME 3 n 1  Once        03/19/23 1116   03/19/23 1117  For home use only DME Shower stool  Once        03/19/23 1116              Allergies  Allergen Reactions   Cefdinir Nausea And Vomiting and Other (See Comments)    Swelling of throat.   Milk (Cow) Other (See Comments) and Anaphylaxis    Other reaction(s): OTHER  Difficulty breathing, throat swells   Prednisone     Other reaction(s): Other (See Comments) LOST 40% OF VISION IN ONE EYE  Patient reports she is allergic to "all steroids"   Acyclovir Nausea And Vomiting    Other reaction(s): Unknown   Codeine Nausea Only   Other Nausea Only    Antibiotic (oral) patient unsure which one     Subjective: pt reports she would like to go home ASAP, declining further tests, blood draws   Discharge Exam: BP (!) 144/68 (BP Location: Right Arm)   Pulse 86   Temp 99.2 F (37.3 C)   Resp 14   LMP 09/21/2014   SpO2 100%  General: Pt is alert, awake, not in acute distress, frail appearing  Cardiovascular: RRR, S1/S2 +, no rubs, no gallops Respiratory: CTA bilaterally, no wheezing, no rhonchi Extremities: no edema, no cyanosis Neuro: alert, oriented x3     The results of significant diagnostics from this hospitalization  (including imaging, microbiology, ancillary and laboratory) are listed below for reference.     Microbiology: No results found for this or any previous visit (from the past 240 hour(s)).   Labs: BNP (last 3 results) No results for input(s): "BNP" in the last 8760 hours. Basic Metabolic Panel: Recent Labs  Lab 03/17/23 1036 03/17/23 1843 03/18/23 0412 03/19/23 0836  NA 126* 127* 129* 131*  K 2.5* 2.6* 3.2* 3.5  CL 91* 93* 98 97*  CO2 24 25 25 26   GLUCOSE 273* 202* 188* 334*  BUN 26* 21* 18 11  CREATININE 0.85 0.86 0.63 0.56  CALCIUM 8.4* 8.3* 8.4* 8.4*  MG 1.6* 2.2  --   --   PHOS 2.7  --   --   --    Liver Function Tests: Recent Labs  Lab 03/17/23 1036 03/18/23 0412  AST 32 23  ALT 38 31  ALKPHOS 162* 155*  BILITOT 1.6* 1.4*  PROT 6.6 6.3*  ALBUMIN 2.1* 2.0*   Recent Labs  Lab 03/17/23 1036  LIPASE 24   No results for input(s): "AMMONIA" in the last 168 hours. CBC: Recent Labs  Lab 03/17/23 1036 03/18/23 0412 03/19/23 0836  WBC 14.4* 9.9 8.7  NEUTROABS 12.1*  --  7.1  HGB 9.1* 8.4* 8.8*  HCT 27.7* 25.8* 27.2*  MCV 87.1 87.5 88.6  PLT 329 354 470*   Cardiac Enzymes: No results for input(s): "CKTOTAL", "CKMB", "CKMBINDEX", "TROPONINI" in the last 168 hours. BNP: Invalid input(s): "POCBNP" CBG: Recent Labs  Lab 03/18/23 0815 03/18/23 1156 03/18/23 1543 03/18/23 2059 03/19/23 0746  GLUCAP 196* 260* 289* 329* 294*   D-Dimer No results for input(s): "DDIMER" in the last 72 hours. Hgb A1c Recent Labs    03/18/23 0412  HGBA1C 8.9*   Lipid Profile No results for input(s): "CHOL", "HDL", "LDLCALC", "TRIG", "CHOLHDL", "LDLDIRECT" in the last 72 hours. Thyroid function studies No results for input(s): "TSH", "T4TOTAL", "T3FREE", "THYROIDAB" in the last 72 hours.  Invalid input(s): "FREET3" Anemia work up No results  for input(s): "VITAMINB12", "FOLATE", "FERRITIN", "TIBC", "IRON", "RETICCTPCT" in the last 72 hours. Urinalysis    Component  Value Date/Time   COLORURINE AMBER (A) 03/17/2023 1036   APPEARANCEUR HAZY (A) 03/17/2023 1036   LABSPEC 1.013 03/17/2023 1036   PHURINE 5.0 03/17/2023 1036   GLUCOSEU NEGATIVE 03/17/2023 1036   HGBUR NEGATIVE 03/17/2023 1036   BILIRUBINUR NEGATIVE 03/17/2023 1036   KETONESUR NEGATIVE 03/17/2023 1036   PROTEINUR 30 (A) 03/17/2023 1036   NITRITE NEGATIVE 03/17/2023 1036   LEUKOCYTESUR NEGATIVE 03/17/2023 1036   Sepsis Labs Recent Labs  Lab 03/17/23 1036 03/18/23 0412 03/19/23 0836  WBC 14.4* 9.9 8.7   Microbiology No results found for this or any previous visit (from the past 240 hour(s)). Imaging CT ABDOMEN PELVIS WO CONTRAST  Result Date: 03/17/2023 CLINICAL DATA:  Abdominal pain. Recent fall. Fever. Undergoing chemotherapy for pancreatic carcinoma. * Tracking Code: BO * EXAM: CT ABDOMEN AND PELVIS WITHOUT CONTRAST TECHNIQUE: Multidetector CT imaging of the abdomen and pelvis was performed following the standard protocol without IV contrast. RADIATION DOSE REDUCTION: This exam was performed according to the departmental dose-optimization program which includes automated exposure control, adjustment of the mA and/or kV according to patient size and/or use of iterative reconstruction technique. COMPARISON:  Abdomen only MRI on 01/20/2023 FINDINGS: Lower chest: Multiple small pulmonary nodules are seen in both lung bases, consistent with pulmonary metastases. Hepatobiliary: Evaluation is limited by lack of IV contrast, however, there are at least 2 ill-defined low-attenuation lesions seen in the inferior left and right hepatic lobes measuring approximately 3 cm which are highly suspicious for hepatic metastases. Prior cholecystectomy again noted. A stent is now seen in the common bile duct with pneumobilia. Pancreas: Mass in the pancreatic head appears increased in size since previous study, currently measuring approximately 5.1 x 3.7 cm compared to 3.2 x 2.8 cm previously. This is  consistent with enlarging pancreatic carcinoma. Spleen:  Within normal limits in size. Adrenals/Urinary tract: No evidence of urolithiasis or hydronephrosis. Unremarkable unopacified urinary bladder. Stomach/Bowel: No evidence of obstruction, inflammatory process, or abnormal fluid collections. Vascular/Lymphatic: Mildly enlarged lymph node in the porta hepatis measuring 12 mm appears new or increased since previous study, suspicious for metastatic lymphadenopathy. No evidence of abdominal aortic aneurysm. Reproductive: Uterus is unremarkable. A cystic lesion is seen in the right adnexa measuring 6.7 x 4.9 cm which shows at least 1 internal septation, but is not adequately characterized on this unenhanced exam. No evidence of free fluid. Other:  None. Musculoskeletal:  No suspicious bone lesions identified. IMPRESSION: Increased size of pancreatic head mass, consistent with pancreatic carcinoma. New low-attenuation liver lesions, highly suspicious for hepatic metastases. Probable mild metastatic lymphadenopathy in porta hepatis. Multiple small pulmonary nodules in both lung bases, consistent with pulmonary metastases. 6.7 cm cystic lesion in right adnexa, not adequately characterized on this unenhanced exam. Cystic ovarian neoplasm cannot be excluded. Consider pelvic ultrasound or MRI for further characterization. Electronically Signed   By: Danae Orleans M.D.   On: 03/17/2023 15:00   CT Head Wo Contrast  Result Date: 03/17/2023 CLINICAL DATA:  Provided history: Head trauma, abnormal mental status. EXAM: CT HEAD WITHOUT CONTRAST TECHNIQUE: Contiguous axial images were obtained from the base of the skull through the vertex without intravenous contrast. RADIATION DOSE REDUCTION: This exam was performed according to the departmental dose-optimization program which includes automated exposure control, adjustment of the mA and/or kV according to patient size and/or use of iterative reconstruction technique.  COMPARISON:  Head CT 02/04/2008. FINDINGS: Brain:  Mild generalized cerebral atrophy. Patchy and ill-defined hypoattenuation within the cerebral white matter, overall moderate in severity and greater than expected for age. There is no acute intracranial hemorrhage. No demarcated cortical infarct. No extra-axial fluid collection. No evidence of an intracranial mass. No midline shift. Vascular: No hyperdense vessel.  Atherosclerotic calcifications. Skull: No calvarial fracture or aggressive osseous lesion. Sinuses/Orbits: No mass or acute finding within the imaged orbits. Mild mucosal thickening within the right maxillary sinus at the imaged levels. Small mucous retention cyst within the left maxillary sinus at the imaged levels. 6 mm left frontoethmoidal osteoma. IMPRESSION: 1. No evidence of an acute intracranial abnormality. 2. Cerebral white matter disease which is overall moderate in severity and greater than expected for age. Although nonspecific, this is most often secondary to chronic small vessel ischemia. 3. Mild generalized cerebral atrophy. 4. Paranasal sinus disease at the imaged levels as described. Electronically Signed   By: Jackey Loge D.O.   On: 03/17/2023 14:45   DG Chest 1 View  Result Date: 03/17/2023 CLINICAL DATA:  Weakness.  Falls.  Confusion. EXAM: CHEST  1 VIEW COMPARISON:  12/27/2022. FINDINGS: Low lung volume. Bilateral lung fields are clear. Elevated right hemidiaphragm again seen. Bilateral costophrenic angles are clear. Normal cardio-mediastinal silhouette. No acute osseous abnormalities. The soft tissues are within normal limits. Right-sided CT Port-A-Cath is seen with its tip overlying the cavoatrial junction region. IMPRESSION: *No active disease. Electronically Signed   By: Jules Schick M.D.   On: 03/17/2023 12:27      Time coordinating discharge: over 30 minutes  SIGNED:  Sunnie Nielsen DO [facility]

## 2023-03-19 NOTE — TOC Transition Note (Signed)
Transition of Care [facility]) - CM/SW Discharge Note   Patient Details  Name: Caitlyn Roth MRN: [id number] Date of Birth: 05/07/1966  Transition of Care [facility]) CM/SW Contact:  Garret Reddish, RN Phone Number: 03/19/2023, 1:02 PM   Clinical Narrative:    Chart reviewed.  I have spoken with patient's sister Amy.  She informs me that she would like to speak with [facility] in regards to Hospice at home for her sister.  She reports that she would like for her sister to discharge home with Hospice.  She is not interested in the Hospice home at this time.  Amy informs me that she is the POA for Mrs. Warnke.  I have also given Amy some additional resources for private duty care services.    I have given Hospice referral to [facility] with [facility].    1200-  Ree Kida informs me that patient will be going home with [facility] and family would like EMS to take patient home at 3 pm today.  I have arranged Ambulance Transport with [facility].    Ree Kida with [facility] will arrange needed DME.    I have informed patient's sister Amy of the above information.   I have also informed staff nurse of the above information.    Final next level of care: Home w Hospice Care [facility] will provide Hospice services.) Barriers to Discharge: No Barriers Identified   Patient Goals and CMS Choice CMS Medicare.gov Compare Post Acute Care list provided to:: Patient Represenative (must comment) (Amy, Patients sister has POA) Choice offered to / list presented to : Sibling  Discharge Placement                  Patient to be transferred to facility by: [facility] Name of family member notified: Amy patient's sister Patient and family notified of of transfer: 03/19/23  Discharge Plan and Services Additional resources added to the After Visit Summary for                  DME Arranged:  (Hospice to arranged DME for home use)                     Social Determinants of Health (SDOH) Interventions SDOH Screenings   Food Insecurity: No Food Insecurity (03/17/2023)  Housing: Patient Declined (03/17/2023)  Transportation Needs: No Transportation Needs (03/17/2023)  Utilities: Not At Risk (03/17/2023)  Depression (PHQ2-9): Medium Risk (02/25/2023)  Financial Resource Strain: Low Risk  (01/01/2023)   Received from [facility]  Tobacco Use: High Risk (03/17/2023)     Readmission Risk Interventions     No data to display

## 2023-03-19 NOTE — Progress Notes (Signed)
Patient's friend would like a call from physician in the morning  Talmadge Chad [phone]

## 2023-03-19 NOTE — Progress Notes (Signed)
Patient discharged home via EMS around 2000.

## 2023-03-24 ENCOUNTER — Inpatient Hospital Stay: Payer: BC Managed Care – PPO

## 2023-03-24 ENCOUNTER — Inpatient Hospital Stay: Payer: BC Managed Care – PPO | Attending: Oncology

## 2023-03-28 ENCOUNTER — Telehealth: Payer: Self-pay | Admitting: *Deleted

## 2023-03-28 NOTE — Telephone Encounter (Signed)
I spoke with patient's hospice nurse, Cesalea.  Patient is now at the hospice home for end-of-life care.  Apparently, the hospice social worker suggested that family reach out to [place] for a letter.  However, I have not seen the patient since she discharged from the hospital, and at the time of my last visit, patient had capacity for medical decision making.  I will defer capacity evaluation and any required letters to the hospice provider.

## 2023-03-28 NOTE — Telephone Encounter (Signed)
Patient mother who states she has POA for patient is calling asking Kathyrn Drown, NP write a letter that patient is incompetent and give to her.

## 2023-03-31 ENCOUNTER — Other Ambulatory Visit: Payer: BC Managed Care – PPO

## 2023-03-31 ENCOUNTER — Ambulatory Visit: Payer: BC Managed Care – PPO

## 2023-03-31 ENCOUNTER — Ambulatory Visit: Payer: BC Managed Care – PPO | Admitting: Oncology

## 2023-04-20 DEATH — deceased
